# Patient Record
Sex: Female | Born: 1935 | Race: White | Hispanic: No | Smoking: Former smoker
Health system: Southern US, Community
[De-identification: ages and names within clinical notes are randomized; demographics above are authoritative.]

## PROBLEM LIST (undated history)

## (undated) DIAGNOSIS — N189 Chronic kidney disease, unspecified: Secondary | ICD-10-CM

## (undated) DIAGNOSIS — J449 Chronic obstructive pulmonary disease, unspecified: Secondary | ICD-10-CM

## (undated) DIAGNOSIS — I509 Heart failure, unspecified: Secondary | ICD-10-CM

## (undated) DIAGNOSIS — N289 Disorder of kidney and ureter, unspecified: Secondary | ICD-10-CM

## (undated) DIAGNOSIS — I639 Cerebral infarction, unspecified: Secondary | ICD-10-CM

## (undated) DIAGNOSIS — E7800 Pure hypercholesterolemia, unspecified: Secondary | ICD-10-CM

## (undated) DIAGNOSIS — F32A Depression, unspecified: Secondary | ICD-10-CM

## (undated) DIAGNOSIS — F329 Major depressive disorder, single episode, unspecified: Secondary | ICD-10-CM

## (undated) DIAGNOSIS — I10 Essential (primary) hypertension: Secondary | ICD-10-CM

## (undated) DIAGNOSIS — E119 Type 2 diabetes mellitus without complications: Secondary | ICD-10-CM

## (undated) HISTORY — PX: ABDOMINAL HYSTERECTOMY: SHX81

---

## 1997-05-15 ENCOUNTER — Other Ambulatory Visit: Admission: RE | Admit: 1997-05-15 | Discharge: 1997-05-15 | Payer: Self-pay | Admitting: *Deleted

## 1998-01-29 ENCOUNTER — Other Ambulatory Visit: Admission: RE | Admit: 1998-01-29 | Discharge: 1998-01-29 | Payer: Self-pay | Admitting: *Deleted

## 1999-03-22 ENCOUNTER — Encounter: Admission: RE | Admit: 1999-03-22 | Discharge: 1999-03-22 | Payer: Self-pay | Admitting: *Deleted

## 1999-07-22 ENCOUNTER — Other Ambulatory Visit: Admission: RE | Admit: 1999-07-22 | Discharge: 1999-07-22 | Payer: Self-pay | Admitting: *Deleted

## 2000-09-04 ENCOUNTER — Encounter: Payer: Self-pay | Admitting: Internal Medicine

## 2000-09-04 ENCOUNTER — Encounter: Admission: RE | Admit: 2000-09-04 | Discharge: 2000-09-04 | Payer: Self-pay | Admitting: Internal Medicine

## 2001-09-20 ENCOUNTER — Encounter: Admission: RE | Admit: 2001-09-20 | Discharge: 2001-09-20 | Payer: Self-pay | Admitting: Internal Medicine

## 2001-09-20 ENCOUNTER — Encounter: Payer: Self-pay | Admitting: Internal Medicine

## 2001-10-24 ENCOUNTER — Encounter: Payer: Self-pay | Admitting: *Deleted

## 2001-10-24 ENCOUNTER — Observation Stay (HOSPITAL_COMMUNITY): Admission: EM | Admit: 2001-10-24 | Discharge: 2001-10-25 | Payer: Self-pay | Admitting: *Deleted

## 2002-03-17 ENCOUNTER — Ambulatory Visit (HOSPITAL_COMMUNITY): Admission: RE | Admit: 2002-03-17 | Discharge: 2002-03-17 | Payer: Self-pay | Admitting: Internal Medicine

## 2002-03-17 ENCOUNTER — Encounter: Payer: Self-pay | Admitting: Internal Medicine

## 2002-08-03 ENCOUNTER — Encounter: Payer: Self-pay | Admitting: Neurology

## 2002-08-03 ENCOUNTER — Encounter: Admission: RE | Admit: 2002-08-03 | Discharge: 2002-08-03 | Payer: Self-pay | Admitting: Neurology

## 2002-09-09 ENCOUNTER — Ambulatory Visit (HOSPITAL_COMMUNITY): Admission: RE | Admit: 2002-09-09 | Discharge: 2002-09-09 | Payer: Self-pay | Admitting: Internal Medicine

## 2002-09-09 ENCOUNTER — Encounter: Payer: Self-pay | Admitting: Internal Medicine

## 2002-09-13 ENCOUNTER — Encounter: Admission: RE | Admit: 2002-09-13 | Discharge: 2002-12-12 | Payer: Self-pay | Admitting: Neurology

## 2002-09-23 ENCOUNTER — Encounter: Payer: Self-pay | Admitting: Internal Medicine

## 2002-09-23 ENCOUNTER — Encounter: Admission: RE | Admit: 2002-09-23 | Discharge: 2002-09-23 | Payer: Self-pay | Admitting: Internal Medicine

## 2003-02-09 ENCOUNTER — Encounter (HOSPITAL_COMMUNITY): Admission: RE | Admit: 2003-02-09 | Discharge: 2003-05-10 | Payer: Self-pay | Admitting: Internal Medicine

## 2003-03-02 ENCOUNTER — Ambulatory Visit (HOSPITAL_COMMUNITY): Admission: RE | Admit: 2003-03-02 | Discharge: 2003-03-02 | Payer: Self-pay | Admitting: Gastroenterology

## 2003-03-26 ENCOUNTER — Emergency Department (HOSPITAL_COMMUNITY): Admission: EM | Admit: 2003-03-26 | Discharge: 2003-03-26 | Payer: Self-pay | Admitting: Emergency Medicine

## 2003-04-17 ENCOUNTER — Encounter (HOSPITAL_BASED_OUTPATIENT_CLINIC_OR_DEPARTMENT_OTHER): Admission: RE | Admit: 2003-04-17 | Discharge: 2003-04-24 | Payer: Self-pay | Admitting: Internal Medicine

## 2003-07-15 ENCOUNTER — Ambulatory Visit (HOSPITAL_COMMUNITY): Admission: RE | Admit: 2003-07-15 | Discharge: 2003-07-15 | Payer: Self-pay | Admitting: Cardiology

## 2003-07-25 ENCOUNTER — Encounter (HOSPITAL_BASED_OUTPATIENT_CLINIC_OR_DEPARTMENT_OTHER): Admission: RE | Admit: 2003-07-25 | Discharge: 2003-08-03 | Payer: Self-pay | Admitting: Internal Medicine

## 2003-07-28 ENCOUNTER — Ambulatory Visit (HOSPITAL_COMMUNITY): Admission: RE | Admit: 2003-07-28 | Discharge: 2003-07-28 | Payer: Self-pay | Admitting: Gastroenterology

## 2003-07-28 ENCOUNTER — Encounter: Payer: Self-pay | Admitting: Gastroenterology

## 2003-11-01 ENCOUNTER — Encounter (HOSPITAL_BASED_OUTPATIENT_CLINIC_OR_DEPARTMENT_OTHER): Admission: RE | Admit: 2003-11-01 | Discharge: 2003-11-15 | Payer: Self-pay | Admitting: Internal Medicine

## 2004-02-14 ENCOUNTER — Encounter (HOSPITAL_BASED_OUTPATIENT_CLINIC_OR_DEPARTMENT_OTHER): Admission: RE | Admit: 2004-02-14 | Discharge: 2004-02-26 | Payer: Self-pay | Admitting: Internal Medicine

## 2004-05-06 ENCOUNTER — Ambulatory Visit (HOSPITAL_COMMUNITY): Admission: RE | Admit: 2004-05-06 | Discharge: 2004-05-06 | Payer: Self-pay | Admitting: Internal Medicine

## 2004-05-16 ENCOUNTER — Ambulatory Visit: Payer: Self-pay

## 2004-05-20 ENCOUNTER — Ambulatory Visit: Payer: Self-pay | Admitting: Cardiology

## 2004-05-21 ENCOUNTER — Ambulatory Visit: Payer: Self-pay | Admitting: Cardiology

## 2004-05-22 ENCOUNTER — Ambulatory Visit (HOSPITAL_COMMUNITY): Admission: RE | Admit: 2004-05-22 | Discharge: 2004-05-22 | Payer: Self-pay | Admitting: Cardiology

## 2004-06-03 ENCOUNTER — Ambulatory Visit: Payer: Self-pay

## 2004-06-07 ENCOUNTER — Ambulatory Visit: Payer: Self-pay | Admitting: Gastroenterology

## 2004-07-29 ENCOUNTER — Ambulatory Visit: Payer: Self-pay | Admitting: Cardiology

## 2004-09-30 ENCOUNTER — Ambulatory Visit: Payer: Self-pay | Admitting: Gastroenterology

## 2004-10-02 ENCOUNTER — Ambulatory Visit: Payer: Self-pay

## 2004-10-11 ENCOUNTER — Ambulatory Visit: Payer: Self-pay | Admitting: Gastroenterology

## 2004-10-11 ENCOUNTER — Ambulatory Visit (HOSPITAL_COMMUNITY): Admission: RE | Admit: 2004-10-11 | Discharge: 2004-10-11 | Payer: Self-pay | Admitting: Gastroenterology

## 2005-02-17 ENCOUNTER — Ambulatory Visit: Payer: Self-pay | Admitting: Cardiology

## 2005-03-05 ENCOUNTER — Ambulatory Visit: Payer: Self-pay

## 2005-04-08 ENCOUNTER — Ambulatory Visit: Payer: Self-pay | Admitting: *Deleted

## 2005-06-25 ENCOUNTER — Inpatient Hospital Stay (HOSPITAL_COMMUNITY): Admission: EM | Admit: 2005-06-25 | Discharge: 2005-06-28 | Payer: Self-pay | Admitting: Emergency Medicine

## 2005-06-25 ENCOUNTER — Ambulatory Visit: Payer: Self-pay | Admitting: Pulmonary Disease

## 2005-06-27 ENCOUNTER — Encounter: Payer: Self-pay | Admitting: Cardiology

## 2005-06-27 ENCOUNTER — Ambulatory Visit: Payer: Self-pay | Admitting: Cardiology

## 2005-06-27 ENCOUNTER — Encounter (INDEPENDENT_AMBULATORY_CARE_PROVIDER_SITE_OTHER): Payer: Self-pay | Admitting: *Deleted

## 2005-06-28 ENCOUNTER — Encounter (INDEPENDENT_AMBULATORY_CARE_PROVIDER_SITE_OTHER): Payer: Self-pay | Admitting: *Deleted

## 2005-07-15 ENCOUNTER — Ambulatory Visit: Payer: Self-pay | Admitting: Pulmonary Disease

## 2005-07-29 ENCOUNTER — Ambulatory Visit: Payer: Self-pay | Admitting: Cardiology

## 2005-09-23 ENCOUNTER — Ambulatory Visit (HOSPITAL_COMMUNITY): Admission: RE | Admit: 2005-09-23 | Discharge: 2005-09-23 | Payer: Self-pay | Admitting: Internal Medicine

## 2005-10-30 ENCOUNTER — Emergency Department (HOSPITAL_COMMUNITY): Admission: EM | Admit: 2005-10-30 | Discharge: 2005-10-30 | Payer: Self-pay | Admitting: Emergency Medicine

## 2006-06-25 ENCOUNTER — Ambulatory Visit: Payer: Self-pay | Admitting: Pulmonary Disease

## 2006-08-13 ENCOUNTER — Ambulatory Visit: Payer: Self-pay | Admitting: Internal Medicine

## 2006-09-15 ENCOUNTER — Encounter: Admission: RE | Admit: 2006-09-15 | Discharge: 2006-09-15 | Payer: Self-pay | Admitting: Internal Medicine

## 2006-12-28 ENCOUNTER — Ambulatory Visit: Payer: Self-pay | Admitting: Gastroenterology

## 2007-03-26 DIAGNOSIS — E278 Other specified disorders of adrenal gland: Secondary | ICD-10-CM | POA: Insufficient documentation

## 2007-03-26 DIAGNOSIS — K219 Gastro-esophageal reflux disease without esophagitis: Secondary | ICD-10-CM | POA: Insufficient documentation

## 2007-03-26 DIAGNOSIS — M810 Age-related osteoporosis without current pathological fracture: Secondary | ICD-10-CM | POA: Insufficient documentation

## 2007-03-26 DIAGNOSIS — Z862 Personal history of diseases of the blood and blood-forming organs and certain disorders involving the immune mechanism: Secondary | ICD-10-CM

## 2007-03-26 DIAGNOSIS — I10 Essential (primary) hypertension: Secondary | ICD-10-CM

## 2007-03-26 DIAGNOSIS — K2980 Duodenitis without bleeding: Secondary | ICD-10-CM | POA: Insufficient documentation

## 2007-03-26 DIAGNOSIS — K222 Esophageal obstruction: Secondary | ICD-10-CM

## 2007-03-26 DIAGNOSIS — Q2733 Arteriovenous malformation of digestive system vessel: Secondary | ICD-10-CM

## 2007-03-26 DIAGNOSIS — E785 Hyperlipidemia, unspecified: Secondary | ICD-10-CM

## 2007-03-26 DIAGNOSIS — K5520 Angiodysplasia of colon without hemorrhage: Secondary | ICD-10-CM | POA: Insufficient documentation

## 2007-07-09 ENCOUNTER — Encounter (HOSPITAL_COMMUNITY): Admission: RE | Admit: 2007-07-09 | Discharge: 2007-09-20 | Payer: Self-pay | Admitting: Internal Medicine

## 2007-11-04 ENCOUNTER — Ambulatory Visit (HOSPITAL_COMMUNITY): Admission: RE | Admit: 2007-11-04 | Discharge: 2007-11-04 | Payer: Self-pay | Admitting: Internal Medicine

## 2007-11-12 ENCOUNTER — Encounter: Payer: Self-pay | Admitting: Cardiology

## 2007-11-12 ENCOUNTER — Ambulatory Visit: Payer: Self-pay

## 2007-11-17 ENCOUNTER — Ambulatory Visit (HOSPITAL_COMMUNITY): Admission: RE | Admit: 2007-11-17 | Discharge: 2007-11-17 | Payer: Self-pay | Admitting: Internal Medicine

## 2008-08-24 ENCOUNTER — Inpatient Hospital Stay (HOSPITAL_COMMUNITY): Admission: EM | Admit: 2008-08-24 | Discharge: 2008-08-29 | Payer: Self-pay | Admitting: Emergency Medicine

## 2008-08-24 ENCOUNTER — Encounter (INDEPENDENT_AMBULATORY_CARE_PROVIDER_SITE_OTHER): Payer: Self-pay | Admitting: Internal Medicine

## 2008-08-24 ENCOUNTER — Ambulatory Visit: Payer: Self-pay | Admitting: Vascular Surgery

## 2008-08-25 ENCOUNTER — Ambulatory Visit: Payer: Self-pay | Admitting: Physical Medicine & Rehabilitation

## 2008-08-26 ENCOUNTER — Encounter (INDEPENDENT_AMBULATORY_CARE_PROVIDER_SITE_OTHER): Payer: Self-pay | Admitting: *Deleted

## 2008-08-29 ENCOUNTER — Encounter (INDEPENDENT_AMBULATORY_CARE_PROVIDER_SITE_OTHER): Payer: Self-pay | Admitting: *Deleted

## 2009-01-11 ENCOUNTER — Ambulatory Visit (HOSPITAL_COMMUNITY): Admission: RE | Admit: 2009-01-11 | Discharge: 2009-01-11 | Payer: Self-pay | Admitting: Endocrinology

## 2009-08-08 ENCOUNTER — Ambulatory Visit: Payer: Self-pay | Admitting: Internal Medicine

## 2009-08-08 ENCOUNTER — Inpatient Hospital Stay (HOSPITAL_COMMUNITY): Admission: EM | Admit: 2009-08-08 | Discharge: 2009-08-14 | Payer: Self-pay | Admitting: Emergency Medicine

## 2009-08-08 ENCOUNTER — Telehealth: Payer: Self-pay | Admitting: Gastroenterology

## 2009-08-08 DIAGNOSIS — I6350 Cerebral infarction due to unspecified occlusion or stenosis of unspecified cerebral artery: Secondary | ICD-10-CM | POA: Insufficient documentation

## 2009-08-08 DIAGNOSIS — J811 Chronic pulmonary edema: Secondary | ICD-10-CM | POA: Insufficient documentation

## 2009-08-08 DIAGNOSIS — J449 Chronic obstructive pulmonary disease, unspecified: Secondary | ICD-10-CM

## 2009-08-08 DIAGNOSIS — D649 Anemia, unspecified: Secondary | ICD-10-CM

## 2009-08-08 DIAGNOSIS — M1990 Unspecified osteoarthritis, unspecified site: Secondary | ICD-10-CM | POA: Insufficient documentation

## 2009-08-10 ENCOUNTER — Ambulatory Visit: Payer: Self-pay | Admitting: Internal Medicine

## 2009-08-10 ENCOUNTER — Encounter (INDEPENDENT_AMBULATORY_CARE_PROVIDER_SITE_OTHER): Payer: Self-pay | Admitting: Internal Medicine

## 2009-08-31 ENCOUNTER — Telehealth: Payer: Self-pay | Admitting: Internal Medicine

## 2009-09-04 ENCOUNTER — Encounter (INDEPENDENT_AMBULATORY_CARE_PROVIDER_SITE_OTHER): Payer: Self-pay | Admitting: *Deleted

## 2009-09-04 ENCOUNTER — Telehealth: Payer: Self-pay | Admitting: Gastroenterology

## 2009-10-16 ENCOUNTER — Ambulatory Visit: Payer: Self-pay | Admitting: Gastroenterology

## 2009-10-16 DIAGNOSIS — E119 Type 2 diabetes mellitus without complications: Secondary | ICD-10-CM | POA: Insufficient documentation

## 2009-10-16 DIAGNOSIS — K5289 Other specified noninfective gastroenteritis and colitis: Secondary | ICD-10-CM

## 2009-10-26 ENCOUNTER — Telehealth (INDEPENDENT_AMBULATORY_CARE_PROVIDER_SITE_OTHER): Payer: Self-pay | Admitting: *Deleted

## 2009-10-26 ENCOUNTER — Telehealth: Payer: Self-pay | Admitting: Gastroenterology

## 2009-11-23 ENCOUNTER — Ambulatory Visit: Payer: Self-pay | Admitting: Gastroenterology

## 2010-03-03 ENCOUNTER — Encounter: Payer: Self-pay | Admitting: Internal Medicine

## 2010-03-12 NOTE — Progress Notes (Signed)
Summary: Triage-Severe Diarrhea  Phone Note From Other Clinic   Caller: Tia @ [facility] [place] [phone] Call For: Dr. Arlyce Dice Summary of Call: Pt is having loose stool started a IV on 08-06-09 Initial call taken by: Karna Christmas,  August 08, 2009 8:59 AM  Follow-up for Phone Call        Pt. last seen 12-28-2006.  Pt. has been having loose, multiple stools daily for 4 weeks. Had a stool test that was negative for C-Diff, but she was given Vanco. 250mg  QID for 10 days and has been on IV fluids since Monday, per Dr.South.  No changes in pt. , Dr.South wants GI consulted.  Denies fever, blood, pain.   [facility] PLEASE ADVISE   Follow-up by: Laureen Ochs LPN,  August 08, 2009 9:23 AM  Additional Follow-up for Phone Call Additional follow up Details #1::        She needs an office visit Additional Follow-up by: Louis Meckel MD,  August 08, 2009 9:46 AM    Additional Follow-up for Phone Call Additional follow up Details #2::    Pt. will see Amy Esterwood PAC on 08-10-09 at 1:30pm.Pt's nurse, Janelle Floor, will send pt. med.list and redords and have a family member accompany the patient.  If symptoms become worse call back immediately or go to ER.  Follow-up by: Laureen Ochs LPN,  August 08, 2009 9:59 AM

## 2010-03-12 NOTE — Procedures (Signed)
Summary: Flexible Sigmoidoscopy  Patient: Michelle Bowers Note: All result statuses are Final unless otherwise noted.  Tests: (1) Flexible Sigmoidoscopy (FLX)  FLX Flexible Sigmoidoscopy                             DONE     [facility]     [address] [place], [place]  [address]           FLEXIBLE SIGMOIDOSCOPY PROCEDURE REPORT           PATIENT:  [name], [name] [place]  MR#:  [id number]     BIRTHDATE:  09-30-1930, 79 yrs. old  GENDER:  female           ENDOSCOPIST:  Hedwig Morton. Juanda Chance, MD     Referred by:           PROCEDURE DATE:  08/10/2009     PROCEDURE:  Flexible Sigmoidoscopy with biopsy     ASA CLASS:  Class III     INDICATIONS:  diarrhea C.Diff negative           MEDICATIONS:   Versed 3 mg, Fentanyl 37.5 mcg           DESCRIPTION OF PROCEDURE:   After the risks benefits and     alternatives of the procedure were thoroughly explained, informed     consent was obtained.  Digital rectal exam was performed and     revealed no rectal masses.   The [id number] ([id number]) endoscope was     introduced through the anus and advanced to the splenic flexure,     without limitations.  The quality of the prep was good.  The     instrument was then slowly withdrawn as the mucosa was fully     examined.     <<PROCEDUREIMAGES>>           The area of colon examined was normal in appearance. Random     biopsies were obtained and sent to pathology (see image001 and     image002). r/o microscopic colitis   Retroflexed views in the     rectum revealed no abnormalities.    The scope was then withdrawn     from the patient and the procedure terminated.           COMPLICATIONS:  None           ENDOSCOPIC IMPRESSION:     1) Normal colon     normal mucosa to 100cm ( splenic flrxure), no evidence of PMC,     s/p random biopsie to r/o microscopic colitis     RECOMMENDATIONS:     1) await biopsy results     cont Flagyl 250 mg po tid, Probiotic,low residue diet, see chart     for  orders           REPEAT EXAM:  In 0 year(s) for.           ______________________________     Hedwig Morton. Juanda Chance, MD           CC:           n.     eSIGNED:   Hedwig Morton. Zacherie Honeyman at 08/10/2009 11:45 AM           [place], [place], [phone]  Note: An exclamation mark (!) indicates a result that was not dispersed into the flowsheet. Document Creation Date: 08/10/2009 11:45 AM _______________________________________________________________________  (1)  Order result status: Final Collection or observation date-time: 08/10/2009 11:39 Requested date-time:  Receipt date-time:  Reported date-time:  Referring Physician:   Ordering Physician: Lina Sar [phone]) Specimen Source:  Source: Launa Grill Order Number: [id number] Lab site:

## 2010-03-12 NOTE — Assessment & Plan Note (Signed)
Summary: 23M FU/YF   History of Present Illness Visit Type: Follow-up Visit Primary GI MD: Melvia Heaps MD [facility] Primary Provider: Adrian Prince, MD  Requesting Provider: na Chief Complaint: One month f/u for diarrhea. Pt states that diarrhea is gettting better but at times she will have trouble swallowing but denies any other GI complaints  History of Present Illness:   Michelle Bowers has returned for followup of her diarrhea.  She has lymphocytic colitis.  She was recently started on lialda 2.4 g a day.  Diarrhea has entirely subsided as has her incontinence.  She complains of some difficulties with deglutition since her CVA.   GI Review of Systems    Reports dysphagia with liquids.      Denies abdominal pain, acid reflux, belching, bloating, chest pain, dysphagia with solids, heartburn, loss of appetite, nausea, vomiting, vomiting blood, weight loss, and  weight gain.        Denies anal fissure, black tarry stools, change in bowel habit, constipation, diarrhea, diverticulosis, fecal incontinence, heme positive stool, hemorrhoids, irritable bowel syndrome, jaundice, light color stool, liver problems, rectal bleeding, and  rectal pain.    Current Medications (verified): 1)  Norvasc 10 Mg Tabs (Amlodipine Besylate) .... One Tablet By Mouth Once Daily in The Morning 2)  Aspirin 81 Mg Tbec (Aspirin) .... One Tablet By Mouth Once Daily 3)  Cymbalta 30 Mg Cpep (Duloxetine Hcl) .... One Tablet By Mouth Once Daily in The Morning 4)  Lasix 40 Mg Tabs (Furosemide) .... One Tablet By Mouth Once Daily 5)  Amaryl 4 Mg Tabs (Glimepiride) .... One Tablet By Mouth Once Daily in The Morning 6)  Humalog 100 Unit/ml Soln (Insulin Lispro (Human)) .... Inject 3 Units With Breakfast 7)  Prevacid 30 Mg Cpdr (Lansoprazole) .... One Tablet By Mouth Once Daily 8)  Plavix 75 Mg Tabs (Clopidogrel Bisulfate) .... One Tablet By Mouth Once Daily 9)  Vitamin D3 1000 Unit Caps (Cholecalciferol) .... One Capsule By Mouth  Once Daily 10)  Hydralazine Hcl 100 Mg Tabs (Hydralazine Hcl) .... One Tablet By Mouth Three Times A Day 11)  Lantus 100 Unit/ml Soln (Insulin Glargine) .... Inject 5 Units Two Times A Day 12)  Tylenol 325 Mg Tabs (Acetaminophen) .... Two Tablets By Mouth Every Four Hours As Needed 13)  Ventolin Hfa 108 (90 Base) Mcg/act Aers (Albuterol Sulfate) .... As Directed 14)  Lialda 1.2 Gm  Tbec (Mesalamine) .... Take 2 By Mouth Once Daily. 15)  Florastor 250 Mg Caps (Saccharomyces Boulardii) .... One By Mouth Once Daily  Allergies (verified): 1)  ! Codeine 2)  ! Lopid 3)  ! * Avandia 4)  ! Cardura 5)  ! Prednisone  Past History:  Past Medical History: Reviewed history from 10/16/2009 and no changes required. DM (ICD-250.00) * SUPERIOR MESENTERIC ARTERY ATHEROSCLEROSIS COPD (ICD-496) PULMONARY EDEMA (ICD-514) ANEMIA (ICD-285.9) DEGENERATIVE JOINT DISEASE (ICD-715.90) CEREBROVASCULAR ACCIDENT (ICD-434.91) OSTEOPOROSIS (ICD-733.00) GERD (ICD-530.81) Hx of HYPERLIPIDEMIA (ICD-272.4) Hx of HYPERTENSION (ICD-401.9) Hx of ANGIODYSPLASIA OF INTESTINE (ICD-569.84) Hx of DUODENITIS, WITHOUT HEMORRHAGE (ICD-535.60) Hx of ESOPHAGEAL STRICTURE (ICD-530.3) ADRENAL MASS, LEFT (ICD-255.8) ARTERIOVENOUS MALFORMATION, CECUM (ICD-747.61)  Past Surgical History: Reviewed history from 08/08/2009 and no changes required. vaginal hysterectomy silicone breast implants nose surgery  Family History: Reviewed history from 10/16/2009 and no changes required. Family History of Stomach Cancer: Father Family History of Heart Disease: Mother No FH of Colon Cancer:  Social History: Reviewed history from 10/16/2009 and no changes required. Retired Divorced-Widowed Alcohol Use - no-rare Illicit Drug Use -  no Patient is a former smoker.   Review of Systems       The patient complains of arthritis/joint pain.  The patient denies allergy/sinus, anemia, anxiety-new, back pain, blood in urine, breast  changes/lumps, change in vision, confusion, cough, coughing up blood, depression-new, fainting, fatigue, fever, headaches-new, hearing problems, heart murmur, heart rhythm changes, itching, menstrual pain, muscle pains/cramps, night sweats, nosebleeds, pregnancy symptoms, shortness of breath, skin rash, sleeping problems, sore throat, swelling of feet/legs, swollen lymph glands, thirst - excessive , urination - excessive , urination changes/pain, urine leakage, vision changes, and voice change.    Vital Signs:  Patient profile:   77 year old female Pulse rate:   88 / minute Pulse rhythm:   regular BP sitting:   116 / 60  (left arm) Cuff size:   regular  Vitals Entered By: Ok Anis CMA (November 23, 2009 10:22 AM)   Impression & Recommendations:  Problem # 1:  COLITIS (ICD-558.9) The patient has lymphocytic colitis that has responded well to lialda.  Plan to continue with the same.  Patient Instructions: 1)  Call back as needed  2)  The medication list was reviewed and reconciled.  All changed / newly prescribed medications were explained.  A complete medication list was provided to the patient / caregiver.

## 2010-03-12 NOTE — Assessment & Plan Note (Signed)
Summary: POST HOSPITAL F/U           [facility]   History of Present Illness Visit Type: Follow-up Visit Primary GI MD: Melvia Heaps MD [facility] Primary Provider: Adrian Prince, MD  Requesting Provider: na Chief Complaint: [facility] f/u for recurrent diarrhea. Pt states that she is still having diarrhea  History of Present Illness:   Ms. Heringer has returned for followup of her diarrhea.  She was diagnosed with lymphocytic colitis in July, 2011 at which time she underwent sigmoidoscopy.  Entocort was recommended though it is unclear whether she was placed on this medication.  At any rate, she continues to have diarrhea with incontinence.  Incontinence is due to severe urgency.   GI Review of Systems      Denies abdominal pain, acid reflux, belching, bloating, chest pain, dysphagia with liquids, dysphagia with solids, heartburn, loss of appetite, nausea, vomiting, vomiting blood, weight loss, and  weight gain.      Reports diarrhea.     Denies anal fissure, black tarry stools, change in bowel habit, constipation, diverticulosis, fecal incontinence, heme positive stool, hemorrhoids, irritable bowel syndrome, jaundice, light color stool, liver problems, rectal bleeding, and  rectal pain.    Current Medications (verified): 1)  Norvasc 10 Mg Tabs (Amlodipine Besylate) .... One Tablet By Mouth Once Daily in The Morning 2)  Aspirin 81 Mg Tbec (Aspirin) .... One Tablet By Mouth Once Daily 3)  Cymbalta 30 Mg Cpep (Duloxetine Hcl) .... One Tablet By Mouth Once Daily in The Morning 4)  Lasix 40 Mg Tabs (Furosemide) .... One Tablet By Mouth Once Daily 5)  Amaryl 4 Mg Tabs (Glimepiride) .... One Tablet By Mouth Once Daily in The Morning 6)  Humalog 100 Unit/ml Soln (Insulin Lispro (Human)) .... Inject 3 Units With Breakfast 7)  Prevacid 30 Mg Cpdr (Lansoprazole) .... One Tablet By Mouth Once Daily 8)  Plavix 75 Mg Tabs (Clopidogrel Bisulfate) .... One Tablet By Mouth Once Daily 9)  Vitamin D3 1000 Unit Caps  (Cholecalciferol) .... One Capsule By Mouth Once Daily 10)  Hydralazine Hcl 100 Mg Tabs (Hydralazine Hcl) .... One Tablet By Mouth Three Times A Day 11)  Imodium A-D 2 Mg Tabs (Loperamide Hcl) .... One Tablet By Mouth Before Meals and One Tablet By Mouth At Bedtime 12)  Lantus 100 Unit/ml Soln (Insulin Glargine) .... Inject 5 Units Two Times A Day 13)  Tylenol 325 Mg Tabs (Acetaminophen) .... Two Tablets By Mouth Every Four Hours As Needed 14)  Ventolin Hfa 108 (90 Base) Mcg/act Aers (Albuterol Sulfate) .... As Directed  Allergies (verified): 1)  ! Codeine 2)  ! Lopid 3)  ! * Avandia 4)  ! Cardura 5)  ! Prednisone  Past History:  Past Medical History: DM (ICD-250.00) * SUPERIOR MESENTERIC ARTERY ATHEROSCLEROSIS COPD (ICD-496) PULMONARY EDEMA (ICD-514) ANEMIA (ICD-285.9) DEGENERATIVE JOINT DISEASE (ICD-715.90) CEREBROVASCULAR ACCIDENT (ICD-434.91) OSTEOPOROSIS (ICD-733.00) GERD (ICD-530.81) Hx of HYPERLIPIDEMIA (ICD-272.4) Hx of HYPERTENSION (ICD-401.9) Hx of ANGIODYSPLASIA OF INTESTINE (ICD-569.84) Hx of DUODENITIS, WITHOUT HEMORRHAGE (ICD-535.60) Hx of ESOPHAGEAL STRICTURE (ICD-530.3) ADRENAL MASS, LEFT (ICD-255.8) ARTERIOVENOUS MALFORMATION, CECUM (ICD-747.61)  Past Surgical History: Reviewed history from 08/08/2009 and no changes required. vaginal hysterectomy silicone breast implants nose surgery  Family History: Family History of Stomach Cancer: Father Family History of Heart Disease: Mother No FH of Colon Cancer:  Social History: Retired Divorced-Widowed Alcohol Use - no-rare Illicit Drug Use - no Patient is a former smoker.  Smoking Status:  quit  Review of Systems  The patient complains of arthritis/joint pain, fatigue, and hearing problems.  The patient denies allergy/sinus, anemia, anxiety-new, back pain, blood in urine, breast changes/lumps, change in vision, confusion, cough, coughing up blood, depression-new, fainting, fever, headaches-new,  heart murmur, heart rhythm changes, itching, menstrual pain, muscle pains/cramps, night sweats, nosebleeds, pregnancy symptoms, shortness of breath, skin rash, sleeping problems, sore throat, swelling of feet/legs, swollen lymph glands, thirst - excessive , urination - excessive , urination changes/pain, urine leakage, vision changes, and voice change.    Vital Signs:  Patient profile:   77 year old female Pulse rate:   88 / minute Pulse rhythm:   regular BP sitting:   112 / 58  (left arm) Cuff size:   regular  Vitals Entered By: Ok Anis CMA (October 16, 2009 2:50 PM)   Impression & Recommendations:  Problem # 1:  COLITIS (ICD-558.9) Diarrhea is most likely due to her lymphocytic colitis although her polypharmacy may be contributing.  Recommendations #1 trial of Pepto-Bismol 15cc  q.i.d. She can continue Imodium p.r.n.  Problem # 2:  Hx of ANGIODYSPLASIA OF INTESTINE (NFA-213.08) Assessment: Comment Only  Patient Instructions: 1)  Please schedule a follow-up appointment in 1 month.  2)  #1 trial of Pepto-Bismol 15cc  q.i.d. She can continue Imodium p.r.n. 3)  The medication list was reviewed and reconciled.  All changed / newly prescribed medications were explained.  A complete medication list was provided to the patient / caregiver. Prescriptions: PEPTO-BISMOL MAX STRENGTH 525 MG/15ML SUSP (BISMUTH SUBSALICYLATE) take 1 tablespoon  four times a day  #1 bottle x 2   Entered by:   Merri Ray CMA (AAMA)   Authorized by:   Louis Meckel MD   Signed by:   Merri Ray CMA (AAMA) on 10/16/2009   Method used:   Print then Give to Patient   RxID:   [id number]

## 2010-03-12 NOTE — Progress Notes (Signed)
Summary: Med. Change  Phone Note Other Incoming   Caller: [phone] Summary of Call: [facility] called and administrator is asking if something besides Entocort 9mg  daily can be ordered for pt. as a 5 day supply is $300.00. and pt is on Medicare.Forwarded to Dr.of the day in Dr.Kaplan's absence. Initial call taken by: Teryl Lucy RN,  October 26, 2009 4:51 PM  Follow-up for Phone Call        I do not feel comfortable prescribing alternative therapies for this patient, that I am not familiar with. The problem is chronic. They can wait for Dr. Arlyce Dice return on Monday Follow-up by: Hilarie Fredrickson MD,  October 26, 2009 4:53 PM  Additional Follow-up for Phone Call Additional follow up Details #1::        Naomi at [facility]. of Dr.Perry's response. Will forward to Dr.Kaplan to address when he returns Monday. Additional Follow-up by: Teryl Lucy RN,  October 26, 2009 4:58 PM    Additional Follow-up for Phone Call Additional follow up Details #2::    DR.KAPLAN--Pt. cannot afford Entocort, Please advise on another med. Laureen Ochs LPN  October 29, 2009 9:34 AM   Additional Follow-up for Phone Call Additional follow up Details #3:: Details for Additional Follow-up Action Taken: try lialda 2.4gm once daily c/b 1 week Additional Follow-up by: Louis Meckel MD,  October 29, 2009 9:47 AM  New/Updated Medications: LIALDA 1.2 GM  TBEC (MESALAMINE) Take 2 by mouth once daily. Prescriptions: LIALDA 1.2 GM  TBEC (MESALAMINE) Take 2 by mouth once daily.  #60 x 6   Entered by:   Laureen Ochs LPN   Authorized by:   Louis Meckel MD   Signed by:   Laureen Ochs LPN on 33/29/5188   Method used:   Printed then faxed to ...         RxID:   [id number]  Janelle Floor advised of med change. Script faxed to her. She will call with pt. update in 1 week, sooner as needed. Laureen Ochs LPN  October 29, 2009 10:00 AM

## 2010-03-12 NOTE — Progress Notes (Signed)
Summary: TRIAGE-DIARRHEA  Phone Note Call from Patient   Caller: Daughter  Aurther Loft [phone] Call For: Dr. Juanda Chance Summary of Call: Diarrhea...pt is wanting to go to ER but pt.'s daughter would like to know what Dr. Juanda Chance recommends Initial call taken by: Karna Christmas,  August 31, 2009 10:49 AM  Follow-up for Phone Call        Pt. was discharged on 08-14-09. Per hospital reports, pt. was negative for c-diff.  Flex biopsies done 08-10-09 showed lymphocytic colitis. Among her other discharge meds are Florastor once daily and Immodium 2 three times a day AC & HS.  Message left for patient's daughter  to callback. Follow-up by: Laureen Ochs LPN,  August 31, 2009 11:01 AM  Additional Follow-up for Phone Call Additional follow up Details #1::        Per pt. daughter, pt. is having episodes of diarrhea, they are becomming more frequent. Denies blood, fever, pain.   DR.Chamille Werntz PLEASE ADVISE  Additional Follow-up by: Laureen Ochs LPN,  August 31, 2009 12:47 PM    Additional Follow-up for Phone Call Additional follow up Details #2::    Please start pt. on Prednisone 10 mg daily, # 30, 1 refill. I have left a message for daughter Aurther Loft to call you, Gavin Pound, this afternoon concerning sending pt Prednisone. I would like her to start it before the weekend. Follow-up by: Hart Carwin MD,  August 31, 2009 1:05 PM  Additional Follow-up for Phone Call Additional follow up Details #3:: Details for Additional Follow-up Action Taken: Above MD orders reviewed with patients daughter,Terry and I have given the Prednisone order to the patients nurse, Candy Sledge LPN, at [facility].  Terry/Naomi instructed to call back as needed. I will call for an update on Monday. Additional Follow-up by: Laureen Ochs LPN,  August 31, 2009 1:19 PM  New/Updated Medications: PREDNISONE 10 MG  TABS (PREDNISONE) Take 1 by mouth daily. Prescriptions: PREDNISONE 10 MG  TABS (PREDNISONE) Take 1 by mouth daily.  #30 x 1   Entered by:   Laureen Ochs LPN   Authorized by:   Hart Carwin MD   Signed by:   Laureen Ochs LPN on 45/40/9811   Method used:   Historical   RxID:   [id number]

## 2010-03-12 NOTE — Progress Notes (Signed)
Summary: Condition Update  Phone Note Outgoing Call   Call placed by: Laureen Ochs LPN,  September 04, 2009 8:29 AM Call placed to: pt. daughter,Terry [phone] Summary of Call: CONDITION UPDATE-Per pt. daughter,Terry, since starting Prednisone 10mg  once daily the pt's diarrhea has improved. She had Lymphocytic Colitis on Flex. biopsy done in the hospital on 08-10-09.   DR.Jaselynn Tamas--Pt. is paralyzed and lives in a nursing home, daughter states it is dificult to get her to the office for an appointment, but they can make arrangements.  Do you want to see her in the office or just give further orders by phone?   Initial call taken by: Laureen Ochs LPN,  September 04, 2009 8:32 AM  Follow-up for Phone Call        If she's improved she can alternate pred 10mg  with 5mg  once daily for 1 week, then decrease to 5mg  once daily for a week, then 5mg  every other day for 1 week. OV 1 month Follow-up by: Louis Meckel MD,  September 04, 2009 9:46 AM  Additional Follow-up for Phone Call Additional follow up Details #1::        1) Above MD orders given to Dr.South's nurse,Jamie LPN, to give to the [facility].   2) F/U appt. w/Dr.Jurline Folger is scheduled for 10-16-09 at 2:30pm.  Appt. info. given to pt's daughter, Aurther Loft.  Aurther Loft will callback as needed. Additional Follow-up by: Laureen Ochs LPN,  September 04, 2009 10:44 AM

## 2010-03-12 NOTE — Progress Notes (Signed)
  Phone Note Other Incoming   Summary of Call: Call rec'd from [place] at [facility] and over the past week pt. has been having lg. explosive episodes of diarrhea with urgency and cramping which occur 1-2 x/day..No blood or mucus seen in stool No fever..Is using Pepto-Bismol q.i.d. and Immodium usually once a day.Dr.South was contacted and he referred her back to you for further tx. Initial call taken by: Teryl Lucy RN,  October 26, 2009 11:09 AM  Follow-up for Phone Call        begin entocort 9mg  once daily c/b next weeks Follow-up by: Louis Meckel MD,  October 26, 2009 1:36 PM  Additional Follow-up for Phone Call Additional follow up Details #1::        Janelle Floor at [facility] given Dr.Kaplan's recommendations. Additional Follow-up by: Teryl Lucy RN,  October 26, 2009 3:00 PM

## 2010-03-12 NOTE — Discharge Summary (Signed)
Summary: Anemia, Acute Pulmonary Edema   NAME:  Michelle Bowers, Michelle Bowers             ACCOUNT NO.:  [id number]   MEDICAL RECORD NO.:  [id number]          PATIENT TYPE:  INP   LOCATION:  4706                         FACILITY:  [facility]   PHYSICIAN:  Hillery Aldo, M.D.   DATE OF BIRTH:  Jun 29, 1930   DATE OF ADMISSION:  06/25/2005  DATE OF DISCHARGE:  06/28/2005                                 DISCHARGE SUMMARY   DISCHARGE DIAGNOSES:  1.  Acute pulmonary edema thought to be secondary to a combination of      treatment with Actos and volume overload from blood transfusions  2.  Subcarinal mass status post biopsy, pathology pending.  3.  Diabetes mellitus.  4.  Hypokalemia.  5.  Hypertension.  6.  Anemia with low iron stores and heme-positive stool status post iron,      B12 and folate studies.  7.  Ongoing tobacco abuse.  8.  Degenerative joint disease.   DISCHARGE MEDICATIONS:  1.  Aspirin 81 mg daily.  2.  Metformin 850 mg t.i.d.  3.  Norvasc 10 mg daily.  4.  Diovan/HCTZ 1-1/2 tablets daily.  5.  Metoprolol 25 mg b.i.d.  6.  Chantix as before.  7.  Iron sulfate 325 mg b.i.d.  8.  Spiriva 1 inhalation daily.  9.  Albuterol meter dose inhaler 2 puffs q.4h. p.r.n.  10. Lasix 40 mg daily.  11. Potassium chloride 20 mEq daily.   Note, the patient was instructed to discontinue Actos and to start her  Januvia which she had not yet done.   CONSULTATIONS:  Dr. Danice Goltz of pulmonary medicine.   BRIEF ADMISSION HPI:  The patient is a 77 year old female with past medical  history of chronic anemia who presented to the hospital with complaints of  chest pain, dyspnea and paroxysmal nocturnal dyspnea.  Upon initial  evaluation, she was found to be anemic and had an elevated BNP.  She  subsequently underwent a CT angiogram of the chest which revealed a  subcarinal mass.  She was admitted for further evaluation and workup.   PROCEDURES AND DIAGNOSTIC STUDIES:  1.  Chest x-ray on 5, 16,  2007 showed linear atelectasis or scarring at the      left base.  There was stable mild cardiomegaly noted.  2.  CT angiogram of the chest on Jun 25, 2005 showed no evidence of      pulmonary embolus.  There were multiple borderline enlarged paratracheal      nodes and an enlarged subcarinal nodal mass which was concerning for      metastatic disease.  There was a small right pleural effusion and      bilateral lower lobe atelectasis.  There was intralobar septal      thickening suggestive of edema.  Focal ground glass attenuation anterior      right upper lobe was thought to reflect atelectasis.  There was mild      cardiomegaly and mitral valve annular calcifications.  There were      bilateral adrenal masses for incompletely characterized and the  radiologist recommended a CT scan with and without contrast versus an      MRI for further evaluation if clinically indicated.  3.  A 2-D echocardiography on Jun 27, 2005 showed overall left ventricular      systolic function was vigorous.  There were no left regional wall motion      abnormalities of the ventricle.  There was mild fibrocalcific change of      the aortic root.  There was moderate mitral annular calcification.      There was mild mitral valvular regurgitation.  Left atrial size was at      the upper limits of normal.  There was moderate right ventricular      hypertrophy.  The inferior vena caval was mildly dilated.  4.  Bronchoscopy with biopsy of subcarinal mass done on Jun 27, 2005:      Pathology is pending at the time of this dictation.   DISCHARGE LABORATORY VALUES:  CBC showed a white blood cell count of 8.3,  hemoglobin 10.5, hematocrit 32, platelet count 351.  Sodium was 138,  potassium 3.8, chloride 106, bicarb 25, BUN 19, creatinine 0.9, glucose 217.  Hemoglobin A1c was 6.2, TSH 2.048, iron 27, TIBC 334, percent saturation 8,  B12 741, RBC folate 1226, and ferritin 10.  Fecal occult blood testing was   positive.   HOSPITAL COURSE:  Problem 1:  ACUTE PULMONARY EDEMA:  The patient's clinical  appearance and elevated BNP was suggestive of pulmonary edema.  She also had  a mild elevation of her troponins with normal CK and CK-MB values.  Given  this, her dyspnea was thought to be multifactorial and secondary to anemia,  COPD and mild congestive heart failure thought to be secondary to treatment  with Actos and then complicated by volume overload secondary to 2 units of  packed red blood cells.  She was put on 40 mg of Lasix and a 2-D  echocardiogram was done which did not show any evidence of systolic or  diastolic heart failure.  Her Actos was discontinued and she continued to  diurese with Lasix.  To address her underlying COPD, she was put on Spiriva  and albuterol.  Recommendations were made for outpatient pulmonary function  tests and she is scheduled to follow up with Dr. Jayme Cloud of pulmonary  medicine in one to two weeks' time.  At this point, she is at her baseline  and again has been encouraged to discontinue tobacco smoking.   Problem 2:  SUBCARINAL MASS:  The patient was noted to have a mass in the  subcarinal area on CT scanning of the chest.  Given this, pulmonary  consultation was obtained for bronchoscopy.  The patient underwent  bronchoscopy on Jun 27, 2005.  Biopsies were taken and pathology is pending  at this time.  Incidentally noted was some element of chronic bronchitis on  the bronchoscopy.   Problem 3:  DIABETES:  The patient's metformin was held secondary to  anticipation of contrast dye been given for the CT angiogram of the chest.  Additionally, because of her pulmonary edema, her Actos was discontinued.  While here, she was maintained on a combination of Lantus and sliding scale  insulin.  We will discharge her on metformin and have her continue to hold  her Actos.  The patient states that her primary care physician did start her  on Januvia but she had not  started medication.  She was instructed to start this upon return  home and to keep a log of her blood glucoses so she can  view them with her primary care physician and adjustments can be made to her  hypoglycemic regimen if indicated.  Her hemoglobin A1c was 6.2 revealing  that she has had good control over the past three months.   Problem 4:  HYPERTENSION:  The patient's hypertension was well controlled on  her usual home medicines.   Problem 5:  HYPOKALEMIA:  The patient became slightly hypokalemic with the  addition of Lasix.  She was appropriately repleted and will be discharged on  supplementation.   Problem 6:  ANEMIA:  The patient's admission hemoglobin was 8.4.  She had  recently been seen by her primary care physician and at that time was found  to have heme negative stool.  Her stool was found to be heme positive here.  The patient reports that she has had a GI workup within the last year that  has included an upper and lower endoscopy.  She reports a problem with  anemia chronically.     Given her symptoms, the patient was transfused 2 units packed red blood  cells with an appropriate rise in her hemoglobin.  Iron studies, B12 and  folate studies were checked.  Findings are as noted above.  She was put on  iron supplementation for her low iron stores but she also has a component of  anemia of chronic disease.  Given her recent outpatient GI workup, no  further GI workup was undertaken.  She should follow up with Dr. Arlyce Dice for  any ongoing GI evaluation as an outpatient.   Problem 7:  TOBACCO ABUSE:  The patient was counseled on the importance of  cessation.  Additionally, she states that she has been on Chantix and was  encouraged to continue this at discharge.   DISPOSITION:  The patient will be discharged home.  She is to follow up with  Dr. Jayme Cloud in 1-2 weeks, Dr. Elisabeth Most 1-2 weeks, and Dr. Arlyce Dice on an as-  needed basis.            ______________________________  Hillery Aldo, M.D.     CR/MEDQ  D:  06/28/2005  T:  06/28/2005  Job:  [id number]   cc:   Barbette Hair. Arlyce Dice, M.D. [facility]  [address]  [place]  [place] [address]   Danice Goltz, M.D. [facility]  [address] [place], [place] [address]   Lovenia Kim, D.O.  Fax: [phone]

## 2010-03-12 NOTE — Letter (Signed)
Summary: Appt Reminder 2  [facility]  [address] [place], [place] [address]   Phone: [phone]  Fax: [phone]        September 04, 2009 MRN: [id number]    [facility] [address] [place], [place]  [address]    Dear Ms. Edwyna Ready,   You have a return appointment with Dr.Robert Arlyce Dice on 10-16-09 at 2:30pm.  Please remember to bring a complete list of the medicines you are taking, your insurance card and your co-pay.  If you have to cancel or reschedule this appointment, please call before 5:00 pm the evening before to avoid a cancellation fee.  If you have any questions or concerns, please call [phone].    Sincerely,    Laureen Ochs LPN  Appended Document: Appt Reminder 2 Letter mailed to patient.

## 2010-04-26 ENCOUNTER — Other Ambulatory Visit: Payer: Self-pay | Admitting: Internal Medicine

## 2010-04-28 LAB — BASIC METABOLIC PANEL
BUN: 5 mg/dL — ABNORMAL LOW (ref 6–23)
BUN: 7 mg/dL (ref 6–23)
CO2: 28 mEq/L (ref 19–32)
CO2: 29 mEq/L (ref 19–32)
CO2: 30 mEq/L (ref 19–32)
Calcium: 7.4 mg/dL — ABNORMAL LOW (ref 8.4–10.5)
Calcium: 7.4 mg/dL — ABNORMAL LOW (ref 8.4–10.5)
Calcium: 8.5 mg/dL (ref 8.4–10.5)
Calcium: 8.7 mg/dL (ref 8.4–10.5)
Chloride: 104 mEq/L (ref 96–112)
Chloride: 109 mEq/L (ref 96–112)
Chloride: 110 mEq/L (ref 96–112)
Creatinine, Ser: 0.83 mg/dL (ref 0.4–1.2)
Creatinine, Ser: 0.84 mg/dL (ref 0.4–1.2)
Creatinine, Ser: 0.88 mg/dL (ref 0.4–1.2)
GFR calc Af Amer: >60 mL/min (ref >60)
GFR calc Af Amer: >60 mL/min (ref >60)
GFR calc Af Amer: >60 mL/min (ref >60)
GFR calc non Af Amer: >60 mL/min (ref >60)
GFR calc non Af Amer: >60 mL/min (ref >60)
Glucose, Bld: 134 mg/dL — ABNORMAL HIGH (ref 70–99)
Glucose, Bld: 177 mg/dL — ABNORMAL HIGH (ref 70–99)
Glucose, Bld: 197 mg/dL — ABNORMAL HIGH (ref 70–99)
Glucose, Bld: 228 mg/dL — ABNORMAL HIGH (ref 70–99)
Glucose, Bld: 228 mg/dL — ABNORMAL HIGH (ref 70–99)
Potassium: 3.3 mEq/L — ABNORMAL LOW (ref 3.5–5.1)
Potassium: 4.9 mEq/L (ref 3.5–5.1)
Sodium: 141 mEq/L (ref 135–145)
Sodium: 145 mEq/L (ref 135–145)

## 2010-04-28 LAB — COMPREHENSIVE METABOLIC PANEL
ALT: 10 U/L (ref 0–35)
AST: 11 U/L (ref 0–37)
Alkaline Phosphatase: 80 U/L (ref 39–117)
CO2: 24 mEq/L (ref 19–32)
Chloride: 114 mEq/L — ABNORMAL HIGH (ref 96–112)
Creatinine, Ser: 1.01 mg/dL (ref 0.4–1.2)
GFR calc Af Amer: >60 mL/min (ref >60)
GFR calc non Af Amer: 53 mL/min — ABNORMAL LOW (ref >60)
Sodium: 146 mEq/L — ABNORMAL HIGH (ref 135–145)
Total Bilirubin: 0.4 mg/dL (ref 0.3–1.2)

## 2010-04-28 LAB — GLUCOSE, CAPILLARY
Glucose-Capillary: 103 mg/dL — ABNORMAL HIGH (ref 70–99)
Glucose-Capillary: 126 mg/dL — ABNORMAL HIGH (ref 70–99)
Glucose-Capillary: 155 mg/dL — ABNORMAL HIGH (ref 70–99)
Glucose-Capillary: 160 mg/dL — ABNORMAL HIGH (ref 70–99)
Glucose-Capillary: 172 mg/dL — ABNORMAL HIGH (ref 70–99)
Glucose-Capillary: 180 mg/dL — ABNORMAL HIGH (ref 70–99)
Glucose-Capillary: 191 mg/dL — ABNORMAL HIGH (ref 70–99)
Glucose-Capillary: 233 mg/dL — ABNORMAL HIGH (ref 70–99)
Glucose-Capillary: 234 mg/dL — ABNORMAL HIGH (ref 70–99)
Glucose-Capillary: 260 mg/dL — ABNORMAL HIGH (ref 70–99)
Glucose-Capillary: 290 mg/dL — ABNORMAL HIGH (ref 70–99)
Glucose-Capillary: 357 mg/dL — ABNORMAL HIGH (ref 70–99)
Glucose-Capillary: 398 mg/dL — ABNORMAL HIGH (ref 70–99)
Glucose-Capillary: 162 mg/dL — ABNORMAL HIGH (ref 70–99)
Glucose-Capillary: 218 mg/dL — ABNORMAL HIGH (ref 70–99)
Glucose-Capillary: 285 mg/dL — ABNORMAL HIGH (ref 70–99)

## 2010-04-28 LAB — RENAL FUNCTION PANEL
CO2: 21 mEq/L (ref 19–32)
Glucose, Bld: 198 mg/dL — ABNORMAL HIGH (ref 70–99)
Phosphorus: 2.4 mg/dL (ref 2.3–4.6)
Potassium: 2 mEq/L — CL (ref 3.5–5.1)
Sodium: 144 mEq/L (ref 135–145)

## 2010-04-28 LAB — CBC
HCT: 31 % — ABNORMAL LOW (ref 36.0–46.0)
Hemoglobin: 10.5 g/dL — ABNORMAL LOW (ref 12.0–15.0)
Hemoglobin: 9.2 g/dL — ABNORMAL LOW (ref 12.0–15.0)
MCH: 31.5 pg (ref 26.0–34.0)
MCH: 31.7 pg (ref 26.0–34.0)
MCHC: 34.2 g/dL (ref 30.0–36.0)
MCV: 92.8 fL (ref 78.0–100.0)
MCV: 93.2 fL (ref 78.0–100.0)
MCV: 91.4 fL (ref 78.0–100.0)
Platelets: 354 10*3/uL (ref 150–400)
RBC: 2.91 MIL/uL — ABNORMAL LOW (ref 3.87–5.11)
RBC: 3.33 MIL/uL — ABNORMAL LOW (ref 3.87–5.11)
RBC: 3.08 MIL/uL — ABNORMAL LOW (ref 3.87–5.11)
WBC: 10 10*3/uL (ref 4.0–10.5)

## 2010-04-28 LAB — MAGNESIUM
Magnesium: 2 mg/dL (ref 1.5–2.5)
Magnesium: 1.6 mg/dL (ref 1.5–2.5)

## 2010-04-28 LAB — DIFFERENTIAL
Eosinophils Relative: 1 % (ref 0–5)
Lymphocytes Relative: 6 % — ABNORMAL LOW (ref 12–46)
Lymphs Abs: 0.6 10*3/uL — ABNORMAL LOW (ref 0.7–4.0)

## 2010-04-28 LAB — SEDIMENTATION RATE: Sed Rate: 36 mm/hr — ABNORMAL HIGH (ref 0–22)

## 2010-04-28 LAB — CLOSTRIDIUM DIFFICILE EIA: C difficile Toxins A+B, EIA: NEGATIVE

## 2010-04-28 LAB — MRSA PCR SCREENING: MRSA by PCR: NEGATIVE

## 2010-04-29 ENCOUNTER — Other Ambulatory Visit: Payer: Self-pay | Admitting: Internal Medicine

## 2010-04-29 ENCOUNTER — Ambulatory Visit
Admission: RE | Admit: 2010-04-29 | Discharge: 2010-04-29 | Disposition: A | Discharge status: Home / Self Care | Payer: Medicare Other | Source: Ambulatory Visit | Attending: Internal Medicine | Admitting: Internal Medicine

## 2010-04-29 MED ORDER — IOHEXOL 300 MG/ML  SOLN
100.0000 mL | Freq: Once | INTRAMUSCULAR | Status: AC | PRN
  Administered 2010-04-29: 100 mL via INTRAVENOUS

## 2010-05-14 ENCOUNTER — Other Ambulatory Visit (HOSPITAL_COMMUNITY): Payer: Self-pay | Admitting: General Surgery

## 2010-05-14 DIAGNOSIS — K8020 Calculus of gallbladder without cholecystitis without obstruction: Secondary | ICD-10-CM

## 2010-05-14 LAB — GLUCOSE, CAPILLARY: Glucose-Capillary: 165 mg/dL — ABNORMAL HIGH (ref 70–99)

## 2010-05-14 LAB — CROSSMATCH: ABO/RH(D): O NEG

## 2010-05-19 LAB — PROTIME-INR: INR: 0.9 (ref 0.00–1.49)

## 2010-05-19 LAB — LIPID PANEL
HDL: 67 mg/dL (ref >39)
LDL Cholesterol: 105 mg/dL — ABNORMAL HIGH (ref 0–99)
Triglycerides: 104 mg/dL (ref <150)
VLDL: 21 mg/dL (ref 0–40)

## 2010-05-19 LAB — GLUCOSE, CAPILLARY
Glucose-Capillary: 119 mg/dL — ABNORMAL HIGH (ref 70–99)
Glucose-Capillary: 127 mg/dL — ABNORMAL HIGH (ref 70–99)
Glucose-Capillary: 131 mg/dL — ABNORMAL HIGH (ref 70–99)
Glucose-Capillary: 144 mg/dL — ABNORMAL HIGH (ref 70–99)
Glucose-Capillary: 151 mg/dL — ABNORMAL HIGH (ref 70–99)
Glucose-Capillary: 154 mg/dL — ABNORMAL HIGH (ref 70–99)
Glucose-Capillary: 164 mg/dL — ABNORMAL HIGH (ref 70–99)
Glucose-Capillary: 173 mg/dL — ABNORMAL HIGH (ref 70–99)
Glucose-Capillary: 184 mg/dL — ABNORMAL HIGH (ref 70–99)
Glucose-Capillary: 210 mg/dL — ABNORMAL HIGH (ref 70–99)
Glucose-Capillary: 213 mg/dL — ABNORMAL HIGH (ref 70–99)
Glucose-Capillary: 133 mg/dL — ABNORMAL HIGH (ref 70–99)
Glucose-Capillary: 376 mg/dL — ABNORMAL HIGH (ref 70–99)

## 2010-05-19 LAB — TROPONIN I: Troponin I: 0.03 ng/mL (ref 0.00–0.06)

## 2010-05-19 LAB — BASIC METABOLIC PANEL
CO2: 25 mEq/L (ref 19–32)
Calcium: 9.4 mg/dL (ref 8.4–10.5)
Creatinine, Ser: 0.59 mg/dL (ref 0.4–1.2)
GFR calc Af Amer: >60 mL/min (ref >60)
GFR calc non Af Amer: >60 mL/min (ref >60)
Glucose, Bld: 109 mg/dL — ABNORMAL HIGH (ref 70–99)
Sodium: 143 mEq/L (ref 135–145)

## 2010-05-19 LAB — COMPREHENSIVE METABOLIC PANEL
AST: 17 U/L (ref 0–37)
Albumin: 3.2 g/dL — ABNORMAL LOW (ref 3.5–5.2)
Albumin: 3.5 g/dL (ref 3.5–5.2)
BUN: 6 mg/dL (ref 6–23)
BUN: 11 mg/dL (ref 6–23)
Calcium: 9.5 mg/dL (ref 8.4–10.5)
Calcium: 9.5 mg/dL (ref 8.4–10.5)
Creatinine, Ser: 0.61 mg/dL (ref 0.4–1.2)
Creatinine, Ser: 0.61 mg/dL (ref 0.4–1.2)
GFR calc Af Amer: >60 mL/min (ref >60)
Potassium: 3.5 mEq/L (ref 3.5–5.1)
Total Protein: 6.3 g/dL (ref 6.0–8.3)

## 2010-05-19 LAB — CBC
HCT: 41.6 % (ref 36.0–46.0)
Hemoglobin: 13.8 g/dL (ref 12.0–15.0)
Hemoglobin: 14.6 g/dL (ref 12.0–15.0)
MCHC: 34 g/dL (ref 30.0–36.0)
MCHC: 34.8 g/dL (ref 30.0–36.0)
MCV: 102.3 fL — ABNORMAL HIGH (ref 78.0–100.0)
Platelets: 247 10*3/uL (ref 150–400)
Platelets: 263 10*3/uL (ref 150–400)
RDW: 12.9 % (ref 11.5–15.5)
RDW: 13.1 % (ref 11.5–15.5)
RDW: 13.2 % (ref 11.5–15.5)

## 2010-05-19 LAB — URINALYSIS, ROUTINE W REFLEX MICROSCOPIC
Glucose, UA: 250 mg/dL — AB
Hgb urine dipstick: NEGATIVE
Specific Gravity, Urine: 1.019 (ref 1.005–1.030)
Urobilinogen, UA: 0.2 mg/dL (ref 0.0–1.0)
pH: 7 (ref 5.0–8.0)

## 2010-05-19 LAB — DIFFERENTIAL
Lymphocytes Relative: 7 % — ABNORMAL LOW (ref 12–46)
Lymphs Abs: 0.9 10*3/uL (ref 0.7–4.0)
Monocytes Absolute: 1 10*3/uL (ref 0.1–1.0)
Monocytes Relative: 8 % (ref 3–12)
Neutro Abs: 10.9 10*3/uL — ABNORMAL HIGH (ref 1.7–7.7)

## 2010-05-19 LAB — URINE MICROSCOPIC-ADD ON

## 2010-05-19 LAB — CARDIAC PANEL(CRET KIN+CKTOT+MB+TROPI)
CK, MB: 1.8 ng/mL (ref 0.3–4.0)
CK, MB: 1.9 ng/mL (ref 0.3–4.0)
Relative Index: INVALID (ref 0.0–2.5)
Troponin I: 0.03 ng/mL (ref 0.00–0.06)

## 2010-05-19 LAB — CK TOTAL AND CKMB (NOT AT ARMC)
CK, MB: 1.3 ng/mL (ref 0.3–4.0)
Relative Index: INVALID (ref 0.0–2.5)
Total CK: 42 U/L (ref 7–177)

## 2010-05-19 LAB — MAGNESIUM: Magnesium: 1.8 mg/dL (ref 1.5–2.5)

## 2010-05-19 LAB — HEMOGLOBIN A1C
Hgb A1c MFr Bld: 6.7 % — ABNORMAL HIGH (ref 4.6–6.1)
Mean Plasma Glucose: 146 mg/dL

## 2010-05-19 LAB — PHOSPHORUS: Phosphorus: 3.7 mg/dL (ref 2.3–4.6)

## 2010-05-19 LAB — APTT: aPTT: 35 seconds (ref 24–37)

## 2010-05-21 ENCOUNTER — Encounter (HOSPITAL_COMMUNITY)
Admission: RE | Admit: 2010-05-21 | Discharge: 2010-05-21 | Disposition: A | Discharge status: Home / Self Care | Payer: Medicare Other | Source: Ambulatory Visit | Attending: General Surgery | Admitting: General Surgery

## 2010-05-21 DIAGNOSIS — K8020 Calculus of gallbladder without cholecystitis without obstruction: Secondary | ICD-10-CM

## 2010-05-21 DIAGNOSIS — R1011 Right upper quadrant pain: Secondary | ICD-10-CM | POA: Insufficient documentation

## 2010-05-21 MED ORDER — SINCALIDE 5 MCG IJ SOLR: 0.0200 ug/kg | Freq: Once | INTRAMUSCULAR | Status: DC

## 2010-05-21 MED ORDER — TECHNETIUM TC 99M MEBROFENIN IV KIT
5.3000 | PACK | Freq: Once | INTRAVENOUS | Status: AC | PRN
  Administered 2010-05-21: 5.3 via INTRAVENOUS

## 2010-05-24 ENCOUNTER — Observation Stay (HOSPITAL_COMMUNITY)
Admission: AD | Admit: 2010-05-24 | Discharge: 2010-05-25 | Disposition: A | Discharge status: Nursing Home (Includes ICF) | Payer: Medicare Other | Source: Skilled Nursing Facility | Attending: Endocrinology | Admitting: Endocrinology

## 2010-05-24 DIAGNOSIS — I509 Heart failure, unspecified: Secondary | ICD-10-CM | POA: Insufficient documentation

## 2010-05-24 DIAGNOSIS — Z794 Long term (current) use of insulin: Secondary | ICD-10-CM | POA: Insufficient documentation

## 2010-05-24 DIAGNOSIS — K5520 Angiodysplasia of colon without hemorrhage: Secondary | ICD-10-CM | POA: Insufficient documentation

## 2010-05-24 DIAGNOSIS — E785 Hyperlipidemia, unspecified: Secondary | ICD-10-CM | POA: Insufficient documentation

## 2010-05-24 DIAGNOSIS — E119 Type 2 diabetes mellitus without complications: Secondary | ICD-10-CM | POA: Insufficient documentation

## 2010-05-24 DIAGNOSIS — D649 Anemia, unspecified: Principal | ICD-10-CM | POA: Insufficient documentation

## 2010-05-24 DIAGNOSIS — Z79899 Other long term (current) drug therapy: Secondary | ICD-10-CM | POA: Insufficient documentation

## 2010-05-24 DIAGNOSIS — Z8673 Personal history of transient ischemic attack (TIA), and cerebral infarction without residual deficits: Secondary | ICD-10-CM | POA: Insufficient documentation

## 2010-05-24 DIAGNOSIS — I10 Essential (primary) hypertension: Secondary | ICD-10-CM | POA: Insufficient documentation

## 2010-05-24 DIAGNOSIS — Z7982 Long term (current) use of aspirin: Secondary | ICD-10-CM | POA: Insufficient documentation

## 2010-05-24 LAB — GLUCOSE, CAPILLARY: Glucose-Capillary: 249 mg/dL — ABNORMAL HIGH (ref 70–99)

## 2010-05-24 LAB — VITAMIN B12: Vitamin B-12: 287 pg/mL (ref 211–911)

## 2010-05-24 LAB — RETICULOCYTES
RBC.: 3 MIL/uL — ABNORMAL LOW (ref 3.87–5.11)
Retic Count, Absolute: 63 10*3/uL (ref 19.0–186.0)

## 2010-05-25 LAB — BASIC METABOLIC PANEL
BUN: 16 mg/dL (ref 6–23)
Calcium: 9.2 mg/dL (ref 8.4–10.5)
GFR calc non Af Amer: 58 mL/min — ABNORMAL LOW (ref >60)
Glucose, Bld: 136 mg/dL — ABNORMAL HIGH (ref 70–99)

## 2010-05-25 LAB — CBC
HCT: 31 % — ABNORMAL LOW (ref 36.0–46.0)
MCH: 25.7 pg — ABNORMAL LOW (ref 26.0–34.0)
MCHC: 31.3 g/dL (ref 30.0–36.0)
MCV: 82.2 fL (ref 78.0–100.0)
RDW: 15.1 % (ref 11.5–15.5)

## 2010-05-26 LAB — CROSSMATCH
Antibody Screen: NEGATIVE
Unit division: 0

## 2010-06-17 NOTE — H&P (Signed)
NAME:  Michelle Bowers, Michelle Bowers             ACCOUNT NO.:  [id number]  MEDICAL RECORD NO.:  [id number]           PATIENT TYPE:  O  LOCATION:  1304                         FACILITY:  [facility]  PHYSICIAN:  Tera Mater. Evlyn Kanner, M.D. DATE OF BIRTH:  1930/02/24  DATE OF ADMISSION:  05/24/2010 DATE OF DISCHARGE:                             HISTORY & PHYSICAL   Michelle Bowers is a 77 year old white female patient, under my care at [facility].  She is presenting now for transfusion of her symptomatic anemia.  She has had a multitude of recent medical interactions with most of the centered around what we thought could be gallbladder disease.  A CT of the abdomen and pelvis back in March, suggested that there were several gallstones, largest being 14 mm with no gallbladder wall thickening.  The bile duct was at upper limit of normal.  She did have a consult with surgeon and a hepatobiliary scan was completed 3 days ago, which showed normal examination with a cyst in common bile duct, patent, and a gallbladder ejection fraction of 57%. In addition, we have been dealing with persistent and recurrent anemia. We transfused her one other time, I tried to avoid this last time by giving her some Aranesp last week, we had basically no effect at all. She is chronically on iron supplements.  She has had some GI consultations in the past with Dr. Arlyce Dice and basically we are having difficulty keeping this up.  Because this went down to as low as 6.9 and with her other comorbid conditions, she is now brought in for transfusion for symptomatic anemia.  At the present time, her appetite is doing well.  Her bowels are working about every other day.  She has seen no overt bleeding.  She is having no abdominal pain.  She is having no shortness of breath.  She is having no chest pain.  She is in reasonable spirits.  Her stroke deficits are unchanged.  PAST MEDICAL HISTORY: 1. History of cecal arteriovenous  malformations. 2. Benign left adrenal mass. 3. She has had left brain CVA in July 2010. 4. Hypertension. 5. Transient bradycardia. 6. Type 2 diabetes. 7. Chronic anemia. 8. Chronic lower extremity edema. 9. Degenerative joint disease. 10.Remote hysterectomy. 11.Breast implants. 12.Grade 1 diastolic dysfunction with preserved ejection fraction. 13.She has had some dysphagia, this now resolved.  Her medication list from the nursing home today includes, 1. Norvasc 10. 2. Aspirin 81. 3. Cymbalta 30. 4. Fish oil 1000. 5. Florastor 250. 6. Lasix 40. 7. Amaryl 4. 8. Prevacid 30. 9. Plavix 75. 10.Vitamin D 1000 units daily. 11.Nu-Iron twice daily. 12.Apresoline 100 three times a day. 13.Lialda 1.2 g 2 tablets daily. 14.She is on a sliding scale of Humalog. 15.She is on 5 units of Lantus twice daily.  FAMILY HISTORY:  Her father died at 109 of stomach cancer.  Mother died at 82 of heart failure.  The patient is divorced.  Daughter is here in the room whether she has 1 other child.  She has been a 1 pack a day smoker for 55 years.  PHYSICAL EXAMINATION:  VITAL SIGNS:  On exam  here at [facility], her blood pressure 138/71, pulse 88, respirations 12, temperature 97.6, O2 sat is 95% on room air. GENERAL:  We have a thin, but fairly nontoxic-appearing white female sitting up in no distress, eating dinner.  Face is a bit weak on the right side. HEENT:  Sclerae anicteric.  Oral mucous membranes are moist. NECK:  Supple with a transmitted murmur to the right neck. LUNGS:  Clear without wheezes, rales, or rhonchi. HEART:  Regular with a harsh 3/6 systolic aortic murmur. ABDOMEN:  Soft, nontender, nondistended with no masses or pulsations. EXTREMITIES:  Reveal pulses to be relatively well preserved, some wasting of the right side is noted.  Mild edema is present. NEURO:  The patient is awake, alert.  Her mentation is good.  Speech is fluent and clear.  She is mentating well.  No resting  tremors present.  Hemoglobin was 6.9 at the nursing home.  In summary, have a 77 year old with symptomatic anemia, brought in for transfusion.  It is interesting she got virtually no effect whatsoever with an Aranesp infusion.  We are going to check her anemia panel and she may need hematology evaluation depending on how her anemia does. Blood pressure is under reasonable control.  No evidence of heart failure at the present time.  Blood sugars are fair.  She was 249 before supper.  We will blister dose a bit.  Her cerebrovascular status is stable.  No pain management issues are present.          ______________________________ Tera Mater. Evlyn Kanner, M.D.     SAS/MEDQ  D:  05/24/2010  T:  05/25/2010  Job:  [id number]  Electronically Signed by Michelle Bowers M.D. on 06/17/2010 12:44:49 PM

## 2010-06-17 NOTE — Discharge Summary (Signed)
  NAME:  Michelle Bowers, Michelle Bowers             ACCOUNT NO.:  [id number]  MEDICAL RECORD NO.:  [id number]           PATIENT TYPE:  O  LOCATION:  1304                         FACILITY:  [facility]  PHYSICIAN:  Tera Mater. Evlyn Kanner, M.D. DATE OF BIRTH:  [birth year]  DATE OF ADMISSION:  05/24/2010 DATE OF DISCHARGE:  05/25/2010                              DISCHARGE SUMMARY   DISCHARGE DIAGNOSES: 1. Symptomatic severe anemia, now status post transfusion. 2. Significant iron deficiency, likely from colonic arterial venous     malformations. 3. Hypertension with stable levels. 4. History of stroke. 5. Prior congestive heart failure with no evidence of worsening during     the transfusion. 6. Type 2 diabetes with reasonable control and fasting of 160. 7. Hyperlipidemia on therapy.  PROCEDURES:  Included transfusion of two units of packed red blood cells.  CONSULTATIONS:  None.  HISTORY:  [name] is a 77 year old white female under my care at [facility].  She presented yesterday at my request for transfusion.  Due to the late hour, she had to be admitted overnight. She has now received 2 units of packed red blood cells and is doing well.  She is lying flat without any dyspnea and has no neck vein distention or lung findings.  Her blood pressure is excellent at 121/61, pulse is stable at 74, respirations 14, temperature 96, O2 saturation 94% on room air.  Her hemoglobin has improved significantly from the 6.9 pretransfusion to 9.7 now.  Iron studies were done, showing a low iron at 12, a low ferritin at 10, a normal folic acid at greater than 20, a normal B12 at 287 and a fair reticulocyte count at 63,000.  The patient tolerated this transfusion without difficulty.  She is to return to [place] under my care today.  We will give her a dose of IV iron prior to leaving.  DISCHARGE MEDICATIONS:  Her medications upon transfer back will include resumption of everything she was on before.   Specifically that includes: 1. Norvasc 10 once daily. 2. Aspirin 81 once daily. 3. Plavix 75 once daily. 4. Cymbalta 30 once daily. 5. Lasix 40 once daily. 6. Amaryl 4 mg each morning. 7. Hydralazine 100 three times daily. 8. The sliding-scale NovoLog that she was on before. 9. The Lantus will now be 8 units b.i.d. 10.She is on Nu-Iron 150 twice daily. 11.She is on Lialda 2.4 mg once daily. 12.She is on her proton pump inhibitor, which is Prevacid 30 daily. 13.She is on her Florastor as before.  DISCHARGE INSTRUCTIONS:  She is to resume her no concentrated sweets diet.  She is to have pre meals and bedtime sugar checks.  CODE STATUS:  Her code status remains Full Code.  FOLLOWUP:  I will see her back at the nursing home.          ______________________________ Tera Mater. Evlyn Kanner, M.D.     SAS/MEDQ  D:  05/25/2010  T:  05/25/2010  Job:  [id number]  Electronically Signed by Adrian Prince M.D. on 06/17/2010 12:44:46 PM

## 2010-06-25 NOTE — Discharge Summary (Signed)
NAMEJAYLN, BRANSCOM             ACCOUNT NO.:  [id number]   MEDICAL RECORD NO.:  [id number]          PATIENT TYPE:  INP   LOCATION:  3715                         FACILITY:  [facility]   PHYSICIAN:  Elliot Cousin, M.D.    DATE OF BIRTH:  05-06-1930   DATE OF ADMISSION:  08/24/2008  DATE OF DISCHARGE:                               DISCHARGE SUMMARY   DISCHARGE DIAGNOSES:  1. Acute left brain stroke with residual right-sided hemiparesis,      dysarthria, and dysphagia.  (MRI of the brain on August 24, 2008      revealed an acute infarct in the left external capsule and      periventricular white matter).  2. Advanced atherosclerotic disease and peripheral vascular disease.      (The MRA of the brain and neck on July 15 revealed moderate chronic      microvascular ischemia, left subclavian artery with moderate      stenosis, irregular stenosis in the left common carotid artery and      the right subclavian artery, and moderately severe stenosis at the      left middle cerebral artery bifurcation).  The carotid duplex      Doppler study performed on August 24, 2008 revealed severe plaque      disease without significant internal carotid artery stenosis      bilaterally.  3. Mild to moderate left ventricular hypertrophy, ejection fraction 65-      70%, grade 1 diastolic dysfunction per two-dimensional      echocardiogram on August 24, 2008.  4. Tobacco abuse.  5. Mild dyslipidemia.  The patient refuses to take ongoing Zocor.  6. Hypertension.  7. Type 2 diabetes mellitus.  Hemoglobin A1c was 6.7.  8. Mild depression.  Lexapro started.  9. Abnormal enhancement in the right cerebellum possibly secondary to      an atypical venous anomaly.  (THE PATIENT WILL NEED A FOLLOWUP MRI      OF THE BRAIN IN 6-12 MONTHS.)   MEDICATIONS:  1. Norvasc 10 mg daily.  2. Aspirin 81 mg daily.  3. Plavix 75 mg daily.  4. Lexapro 5 mg daily.  5. Ferrous sulfate 325 mg daily.  6. Amaryl 2 mg b.i.d. to be  titrated to 4 mg b.i.d.  7. Metformin 500 mg b.i.d. to be titrated to 850 mg b.i.d.  8. Lantus insulin 15 units subcu nightly.  9. Metoprolol 100 mg in the morning and 50 mg in the evening.  10.Diovan 320 mg daily.  11.Protonix 40 mg daily.   DISPOSITION:  The patient is approaching medical stability.  The plan is  to discharge her to a skilled nursing facility of choice for ongoing  rehabilitation.   CONSULTATIONS:  Rehabilitation medicine physician.   PROCEDURES PERFORMED:  1. MRI of the brain and MRA of the brain and neck on August 24, 2008.  2. 2-D echocardiogram on August 24, 2008.  3. Carotid duplex study on August 24, 2008.   HISTORY OF PRESENT ILLNESS:  The patient is a 77 year old woman with a  past medical history significant for type 2 diabetes  mellitus,  hypertension, tobacco abuse and chronic anemia.  She presented to the  emergency department on August 24, 2008 with a chief complaint of slurred  speech and weakness in her right arm and right leg.  When the patient  went to sleep the night before, she had no such symptoms.  However, when  she woke up on the morning of admission at 4:30 a.m., she realized her  symptoms.  She called 9-1-1.  She was brought to the emergency  department.  During the evaluation in the emergency department, a CT  scan of the head was ordered and it revealed no acute intracranial  abnormality.  However, there was evidence of extensive chronic  microvascular matter disease and diffuse mild cerebral atrophy.  She was  afebrile and mildly hypertensive with a blood pressure of 165/56.  She  was admitted for further evaluation and management.   For additional details please see the dictated history and physical.   HOSPITAL COURSE:  1. ACUTE LEFT BRAIN STROKE, ADVANCED CEREBRAL ATHEROSCLEROTIC DISEASE,      PERIPHERAL VASCULAR DISEASE, RIGHT-SIDED HEMIPARESIS, DYSARTHRIA      AND DYSPHAGIA.  The  patient's swallowing was evaluated in the      emergency  department and apparently she passed according to the ED      nurse.  However, when she was admitted to the floor, the registered      nurse who was attending to her noticed that the patient was having      more difficulty speaking and swallowing.  Therefore, the speech      therapist was consulted and the patient's diet was downgraded to      dysphagia 1 with thin liquids.  The speech therapist followed the      patient throughout the hospitalization.  With her followup      assessment, she noted that the patient was having some coughing      with thin liquids.  Therefore the liquids were changed to nectar      thickened liquids.  She was started on aspirin therapy 325 mg      daily.  For further evaluation, a number of studies were ordered      including an MRI of the brain, MRA of the brain, MRA of the neck,      carotid duplex, 2-D echocardiogram and a number of laboratory      studies.  The results of the MRI and MRA are above.  In essence,      the studies were significant for an acute left brain stroke, severe      atherosclerotic disease, moderately severe stenosis in the left MCA      bifurcation and moderate stenosis of the left subclavian artery.      Given these findings, the patient was started on Plavix in addition      to aspirin.  The dose of aspirin was decreased to 81 mg daily.  The      2-D echocardiogram revealed LVH and grade 1 diastolic dysfunction      but no evidence of a cardioembolic source.  Her fasting lipid      profile revealed a total cholesterol of 193, triglycerides of 104,      HDL cholesterol of 67, and LDL cholesterol of 105.  Zocor was      started not only for dyslipidemia but also for additional treatment      of  peripheral vascular and cerebrovascular disease.  After the  patient had been treated with Zocor for 2 days, she refused to take      more as she stated that the Zocor had caused muscle pain in the      past.  Her cardiac panel was  within normal limits.  Her urinalysis      was positive for protein however, negative for nitrites and      leukocytes.  Her TSH was within normal limits at 1.105 and her      hemoglobin A1c was 6.7.  The patient was evaluated by both the      physical and occupational therapists.  Per their assessment, she      would be an excellent candidate for inpatient rehabilitation at      [facility].  The rehabilitation physician, Dr. Jodean Lima,      evaluated the patient and recommended CIR if the family could      provide close to 24-hour assistance at home or skilled nursing      facility placement if the family could not provide assistance      following rehab.  The family would not be able to provide      assistance at home following inpatient rehabilitation and therefore      skilled nursing facility placement has been pursued.  The search      for a skilled nursing facility bed has been started.  The patient      continues to have right-sided hemiparesis, dysarthria and      dysphagia.  However, she is completely alert and oriented.  2. HYPERTENSION.  The patient was restarted on Diovan and metoprolol.      However, initially the Norvasc was withheld.  In light of her      increasing blood pressures, the Norvasc was restarted.  Over the      past 24-48 hours, the patient's systolic blood pressures have been      ranging in the 160s.  IV fluids had been started initially however,      the IV fluids may have been contributing to the elevated blood      pressures.  Therefore the IV fluids were discontinued today.      Hydralazine at 10 mg t.i.d. to q.i.d. may be added if her systolic      blood pressures remain significantly elevated.  3. ABNORMAL ENHANCEMENT IN THE RIGHT CEREBELLUM.  So noted on the MRI      of the brain.  According to the radiologist, the enhancement could      be an atypical venous anomaly.  This was certainly an incidental      finding and further evaluation was  not pursued during this      hospitalization.  Rather, a followup MRI electively in 6-12 months      would be adequate for interval assessment.  4. TYPE 2 DIABETES MELLITUS.  Over the past 2-3 days, the patient's      capillary blood glucose has been uncontrolled.  Amaryl and      metformin were not restarted at the time of the initial hospital      assessment.  Sliding scale NovoLog and Lantus were initiated.      However, in light of the patient's improving p.o. intake, metformin      and Amaryl will be restarted at lower doses.  The doses may be      titrated accordingly.  Her hemoglobin A1c was found to be 6.7.  Of  note, prior to this hospitalization, the patient was not treated      with insulin.  5..  TOBACCO ABUSE.  The patient had a long history of smoking one pack  of cigarettes per day for more than 50 years.  She was strongly advised  to quit smoking.  She was counseled on tobacco cessation.   DISCHARGE DISPOSITION:  The patient is currently approaching clinical  stability.  It is anticipated the patient will be clinically stable  enough to be discharged to a skilled nursing facility of choice within  the next 24-48 hours.      Elliot Cousin, M.D.  Electronically Signed     DF/MEDQ  D:  08/27/2008  T:  08/27/2008  Job:  [id number]   cc:   Lovenia Kim, D.O.  Barbette Hair. Arlyce Dice, MD,FACG

## 2010-06-25 NOTE — Assessment & Plan Note (Signed)
[facility]                             PULMONARY OFFICE NOTE   NAME:Michelle Bowers, Ventress                    MRN:          [id number]  DATE:06/25/2006                            DOB:          [birth year]    This is a very pleasant 77 year old white female whom I actually have  not seen here since June of 2007. At that time, I had seen her post-  hospitalization. She had been hospitalized from the 16 of May to the  19th of May. She had been noted to have mediastinal adenopathy in the  setting of congestive heart failure with negative bronchoscopy and Wang  needle aspirate. At that time, we requested repeat CT and the patient  never had that done. She was supposed to followup in 4 to 6 weeks and  never followed up. Since then, she really has not had any issues. She  seems to be concerned about potential for edema to recur in the lungs.  My recollection is that at that time, during her hospitalization, we  could only attribute her edema from a side effect from Actos, which has  since been discontinued. The patient continues to smoke approximately a  half pack of cigarettes per day. She denies any fevers, chills or  sweats. She has not had any hemoptysis. The patient denies any other  symptomatology. She is currently not using any inhalers though she has  been given apparently Spiriva in the past to use, but she is not  utilizing this medication.   CURRENT MEDICATIONS:  As noted on the intake sheet. These have been  reviewed and are accurate.   PHYSICAL EXAMINATION:  VITAL SIGNS: As noted. Oxygen saturation is 94%  on room air.  In general, this is a well-developed, well-nourished female who is in no  acute distress.  HEENT: Examination is unremarkable.  CARDIAC: Regular rate and rhythm. No rubs, murmurs, gallops.  LOWER EXTREMITIES: No cyanosis, clubbing or edema noted.   We did perform spirometry today which shows an FEV1 of 1.24 liters rate  or 63%  of predicted, with an FEV1 of 67% predicted. Her FEF 25/75 is  markedly decreased at 44% predicted.   Chest x-ray shows no acute infiltrates and no acute process. She does  have COPD changes.   IMPRESSION:  1. Chronic obstructive pulmonary disease with asthmatic bronchitic      component as evidenced by small airways component.  2. Tobacco abuse dependence.  3. History of mediastinal adenopathy in the setting of congestive      heart failure.   PLAN:  1. Will be for the patient to attempt discontinuation of cigarettes. I      have counseled her extensively in this regard.  2. I have instituted therapy with Advair 100/50 one inhalation twice a      day. The patient was taught use of the inhaler. She also showed      proficiency of the same.  3. Followup here will be on a p.r.n. basis. She will follow with a      primary care physician, Dr.  Elisabeth Most. The patient  is aware that I      will be leaving the practice. At present, I do not think      she needs ongoing pulmonary management as her Advair can be filled      by her primary care physician.     Gailen Shelter, MD     CLG/MedQ  DD: 06/25/2006  DT: 06/25/2006  Job #: [id number]   cc:   Lovenia Kim, D.O.

## 2010-06-25 NOTE — Assessment & Plan Note (Signed)
[facility]                         GASTROENTEROLOGY OFFICE NOTE   NAME:[name], Michelle Bowers                    MRN:          [id number]  DATE:12/28/2006                            DOB:          [birth year]    PROBLEM:  Abnormal CT.   [name] has returned for reevaluation.  A CT scan demonstrated a  stable left adrenal mass.  [name] has no GI complaints, including  abdominal pain, change in bowel habits, melena, or hematochezia.  She  has a history of AVM involving the cecum that were obliterated using the  APC.  She informed me that her blood counts had recently been stable.   EXAM:  Pulse 56, blood pressure 130/68, weight 136.  HEENT:  EOMI. PERRLA. Sclerae are anicteric.  Conjunctivae are pink.  NECK:  Supple without thyromegaly, adenopathy or carotid bruits.  CHEST:  Clear to auscultation and percussion without adventitious  sounds.  CARDIAC:  Regular rhythm; normal S1 S2.  There are no murmurs, gallops  or rubs.  ABDOMEN:  Bowel sounds are normoactive.  Abdomen is soft, non-tender and  non-distended.  There are no abdominal masses, tenderness, splenic  enlargement or hepatomegaly.  EXTREMITIES:  Full range of motion.  No cyanosis, clubbing or edema.  RECTAL:  Deferred.   IMPRESSION:  1. Benign left adrenal mass.  2. History of cecal arteriovenous malformations.   RECOMMENDATION:  No further GI workup at this point.     Barbette Hair. Arlyce Dice, MD,FACG  Electronically Signed    RDK/MedQ  DD: 12/28/2006  DT: 12/28/2006  Job #: [id number]   cc:   Lovenia Kim, D.O.

## 2010-06-25 NOTE — Discharge Summary (Signed)
Michelle Bowers, Michelle Bowers             ACCOUNT NO.:  [id number]   MEDICAL RECORD NO.:  [id number]          PATIENT TYPE:  INP   LOCATION:  3715                         FACILITY:  [facility]   PHYSICIAN:  Richarda Overlie, MD       DATE OF BIRTH:  11-10-1930   DATE OF ADMISSION:  08/24/2008  DATE OF DISCHARGE:  08/29/2008                               DISCHARGE SUMMARY   Kindly see discharge summary from July 18 and 19, 2010, for final  discharge diagnosis and further details of the patient's  hospitalization.   INTERIM HOSPITAL COURSE:  1. Acute left brain stem stroke.  The patient was found to be stable      from this standpoint.  The patient has had eveidence of      oropharyngeal dysphagia based on swallow evaluation  , and will be      receiving a barium swallow evaluation prior to her discharge today.      Until then, the patient will continue on nectar-thick liquids with      supervision.  Ensure pudding has been added three times a day.      Once the barium swallow evaluation is completed by the speech      therapist, final swallowing recommendations will be made, and the      patient will be able to be discharged to skilled nursing facility      today.  2. Hypertension with bradycardia.  The patient's beta blocker has been      decreased, and the patient's hydralazine dose is being increased.   DISCHARGE MEDICATIONS:  Remain unchanged.  The only changes that are  being made are as follows:  1. Metoprolol 50 mg p.o. twice a day, hold for heart rate less than      60.  2. Hydralazine 100 mg p.o. three times a day.   DISPOSITION:  The patient can be discharged today after her barium  swallow is completed and final speech recommendations have been made.      Richarda Overlie, MD  Electronically Signed     Richarda Overlie, MD  Electronically Signed    NA/MEDQ  D:  08/29/2008  T:  08/29/2008  Job:  [id number]

## 2010-06-25 NOTE — H&P (Signed)
NAME:  Michelle Bowers, Michelle Bowers             ACCOUNT NO.:  [id number]   MEDICAL RECORD NO.:  [id number]          PATIENT TYPE:  INP   LOCATION:  1828                         FACILITY:  [facility]   PHYSICIAN:  Richarda Overlie, MD       DATE OF BIRTH:  06-01-1930   DATE OF ADMISSION:  08/24/2008  DATE OF DISCHARGE:                              HISTORY & PHYSICAL   PRIMARY CARE PHYSICIAN:  [facility].   OBJECTIVE:  This is a 77 year old female who presents to the emergency  room with the chief complaint that the patient gives of slurred speech  and  inability to use her right upper and right lower extremity.  The  patient went to sleep around 10 o'clock and woke up with these symptoms  around 4:30 this morning.  The patient called 9-1-1 because she was  concerned about these symptoms.  The patient denies any chest pain,  palpitations .  She denied any headache, blurry vision.  She has not had  any history of prior CVA.  The patient smokes a pack per day.   PAST MEDICAL HISTORY:  1. Type 2 diabetes mellitus.  2. Hypertension.  3. Tobacco use.  4. Degenerative joint disease.  5. Chronic bilateral lower extremity edema.  6. Chronic anemia.   PAST SURGICAL HISTORY:  1. Upper and lower endoscopy by Dr. Arlyce Dice.  2. Status post vaginal hysterectomy in 1976 for fibroid uterus.  3. Status post bilateral breast implants in 1976.   ALLERGIES:  CODEINE.   HOME MEDICATIONS:  1. Norvasc 10 mg p.o. daily.  2. Ferrous sulfate 325 mg p.o. daily.  3. Diovan 320 mg p.o. daily.  4. Glimepiride 4 mg p.o. twice a day.  5. Metformin 850 mg p.o. three times a day.  6. Metoprolol 100 mg in the morning and 50 mg in the evening.   REVIEW OF SYSTEMS:  As documented in the HPI.   SOCIAL HISTORY:  The patient is divorced, lives in [place], [place].  Has 2 children.  She is retired from [employer].  Currently  smokes 1 pack per day for the last 55 years.  Denies use of alcohol.   FAMILY  HISTORY:  Father died of stomach cancer at age 9.  Mother died  of heart failure at age 51.   PHYSICAL EXAMINATION:  VITAL SIGNS:  Blood pressure 165/76, pulse 70,  respirations 18 and temperature 98.2.  GENERAL:  Patient is currently alert, awake and comfortable, no acute  distress.  HEENT:  Normocephalic, atraumatic.  Left facial droop noted.  Extraocular movements are intact.  Sclerae anicteric.  Oral mucosa is  dry.  NECK:  Supple without JVD, no thyromegaly, no carotid bruits.  LUNGS:  Clear to auscultation bilaterally without any crackles or  wheezing.  HEART:  Regular rate and rhythm.  No appreciable murmurs, rubs or  gallops.  ABDOMEN:  Soft and nontender, no hepatosplenomegaly.  NEUROLOGIC:  Left facial droop noted.  The patient has decreased motor  strength 3/5 in right upper and right lower extremity.  Deep tendon  reflexes and positive  Babinski on the right. The patient is unable to  perform finger-nose testing.   LABORATORY DATA:  WBC 13.2, hemoglobin 14.7, hematocrit 41.2, platelet  count of 63,000.  INR is 0.9.  Sodium 141, potassium 3.5, chloride 108,  bicarb 24, glucose 210, BUN 11, creatinine 0.61.  Total bilirubin 0.5,  alkaline phosphatase 29, AST 18, ALT 15, total protein 6.3, albumin 3.5,  calcium 9.5, creatine kinase 52, troponin 0.03.   CT scan of the head without contrast shows no acute abnormalities,  vascular changes with mild diffuse cerebral atrophy.  Chest x-ray with  left lower lobe scarring.   ASSESSMENT AND PLAN:  1. Acute left middle cerebral artery stroke.  Based on the patient's      duration of symptoms, she is outside the window of receiving      thrombolytic therapy.  The patient will be started on aspirin,      initiate a stroke workup with MRI/MRA of the brain and 2-D      echocardiogram.  Will also get carotid ultrasound.  The patient      will be monitored with serial neurologic checks.  Get serial      troponins and EKG to rule out an  acute coronary syndrome concurrent      with the stroke.  Risk factor modification.  Will check a lipid      panel, hemoglobin A1c.  Smoking cessation counseling will be given.      PT and OT consultation has also been requested.  2. Hypertension.  The patient will be continued on Diovan for blood      pressure control.  Will hold her Norvasc.  Will continue beta      blocker.  3. Diabetes.  Will hold the metformin and glimepiride and will      continue patient on sliding scale insulin while the patient is      n.p.o. pending speech evaluation.   Patient is a full code.      Richarda Overlie, MD  Electronically Signed     NA/MEDQ  D:  08/24/2008  T:  08/24/2008  Job:  [id number]

## 2010-06-25 NOTE — Discharge Summary (Signed)
NAME:  Michelle Bowers, Michelle Bowers             ACCOUNT NO.:  [id number]   MEDICAL RECORD NO.:  [id number]          PATIENT TYPE:  INP   LOCATION:  3715                         FACILITY:  [facility]   PHYSICIAN:  Richarda Overlie, MD       DATE OF BIRTH:  12-11-1930   DATE OF ADMISSION:  08/24/2008  DATE OF DISCHARGE:  08/28/2008                               DISCHARGE SUMMARY   ADDENDUM   Kindly see discharge summary from August 27, 2008 Dr. Elliot Cousin for  further details of the patient's current hospitalization.   DISCHARGE MEDICATIONS:  1. Aspirin 81 mg p.o. daily.  2. Plavix 75 mg p.o. daily.  3. Lexapro 5 mg p.o. daily.  4. Lantus 15 units subcutaneous nightly.  5. Zantac 150 mg p.o. b.i.d.  6. K-Dur 20 mEq p.o. daily for 5 days.  7. Ferrous sulfate 325 p.o. daily.  8. Norvasc 10 mg p.o. daily.  9. Diovan 320 mg p.o. daily.  10.Glimepiride 2 mg p.o. b.i.d. and increase to 4 mg p.o. b.i.d. in 7      days.  11.Metformin 850 mg p.o. b.i.d.  12.Metoprolol 100 mg p.o. in the morning and 50 mg p.o. in the      evening.  13.Hydralazine 50 mg p.o. t.i.d.   HOSPITAL COURSE:  1. Acute left brain stem stroke with advanced cerebral atherosclerotic      disease and peripheral vascular disease.  The patient continues to      make improvement in terms of her right-sided weakness.  The patient      had a speech and swallow eval that showed mild oropharyngeal      dysphagia characterized by oropharyngeal muscular weakness with      decreased epiglottic deflection.  The patient will require      supervision and she should be allowed to drink only when she is      alert and upright at 90 degrees with small bites/sips, swallow two      to three times after each bite.  Reflux precautions in the upright      position at all times.  2. The patient also was found to have venous anomaly in her right      cerebellum and would need a repeat MRI in 3 - 6 months.  The      patient declined to use Zocor for her  dyslipidemia or any other      statins because of prior history of muscle pain and this has been      discontinued.  3. Hypertension.  The patient's blood pressure was still quite      somewhat elevated at the time of discharge and      therefore hydralazine has been added to her regimen in addition to      p.o. Diovan, Norvasc and metoprolol.  4. Type 2 diabetes.  The patient will be continued on Lantus and will      be restarted on metformin and Amaryl.   Discharge to rehab when bed available.      Richarda Overlie, MD  Electronically Signed  Richarda Overlie, MD  Electronically Signed    NA/MEDQ  D:  08/28/2008  T:  08/28/2008  Job:  [id number]

## 2010-06-27 ENCOUNTER — Encounter (INDEPENDENT_AMBULATORY_CARE_PROVIDER_SITE_OTHER): Payer: Self-pay | Admitting: General Surgery

## 2010-06-28 NOTE — H&P (Signed)
NAME:  Michelle Bowers, Michelle Bowers             ACCOUNT NO.:  [id number]   MEDICAL RECORD NO.:  [id number]          PATIENT TYPE:  EMS   LOCATION:  MAJO                         FACILITY:  [facility]   PHYSICIAN:  Elliot Cousin, M.D.    DATE OF BIRTH:  1930/05/15   DATE OF ADMISSION:  06/25/2005  DATE OF DISCHARGE:                                HISTORY & PHYSICAL   PRIMARY CARE PHYSICIAN:  Lovenia Kim, D.O.   CARDIOLOGIST:  Learta Codding, M.D.   GASTROENTEROLOGIST:  Barbette Hair. Arlyce Dice, M.D.   CHIEF COMPLAINT:  Chest pain and shortness of breath.   HISTORY OF PRESENT ILLNESS:  The patient is a 77 year old lady with a past  medical history significant for hypertension, type 2 diabetes mellitus, and  degenerative joint disease, who presents to the emergency department with a  two day history of chest pain and shortness of breath.  The pain started two  nights ago.  She was having difficulty sleeping and therefore was not  awakened from her sleep with pain.  The pain is described as a tightness.  It has been intermittent and only occurs when she lies flat.  The pain is  associated with shortness of breath, particularly when she lies flat.  The  pain and shortness of breath improve when she sits up.  She denies pleurisy  or chest wall pain.  She denies associated radiation of the pain, nausea,  diaphoresis, or swelling in her legs.  She denies cough and any other upper  respiratory infection symptoms.  She has had fatigue over the past week or  two.  She was evaluated by Dr. Hardie Pulley PA, [facility], last week who  apparently performed a rectal exam.  Per the patient's account, the rectal  exam was negative.  The patient says that blood work was done, and she was  found to be anemic.  Misty increased her iron pills from once daily to twice  daily.  The patient denies black, tarry stools.  She denies bright red blood  per rectum.  The patient has had no recent or remote history of hematemesis,  hemoptysis, or hematuria.   During the evaluation in the emergency department, the patient is noted to  be hemodynamically stable.  Her EKG reveals a normal sinus rhythm with a  heart rate of 70 beats per minute and no acute ST/T wave abnormalities.  Her  chest x-ray reveals linear atelectasis or scarring at the left base and mild  stable cardiomegaly.  Her cardiac markers are essentially negative.  The BNP  is mildly elevated at 510.  The patient will be admitted for further  evaluation and management.   PAST MEDICAL HISTORY:  1.  Type 2 diabetes mellitus.  2.  Hypertension.  3.  Tobacco abuse.  4.  Degenerative joint disease.  5.  Chronic bilateral lower extremity edema.  6.  Chronic anemia.  The patient says that she has had a colonoscopy and an      upper endoscopy over the past several years performed by Dr. Arlyce Dice.      Per her account, the latest colonoscopy  a year or two ago was negative.  7.  Status post vaginal hysterectomy in 1976 for a prolapsed uterus.  8.  Status post bilateral breast implants in 1976.   MEDICATIONS:  1.  Metformin 850 mg t.i.d.  2.  Actos 45 mg daily.  3.  Norvasc 10 mg daily.  4.  Diovan/HCT, question dose, 2 tablets daily.  5.  Metoprolol 50 mg 1/2 tablet b.i.d.   ALLERGIES:  Patient has an allergy to CODEINE.   SOCIAL HISTORY:  The patient is divorced.  [place]  She has two children.  She is retired from [employer].  She  currently smokes a half pack of cigarettes a day, decreased from a pack a  day.  She has been smoking for approximately 52 years.  She denies alcohol  and illicit drug use.  She still drives.   FAMILY HISTORY:  Her father died of stomach cancer at 18 years of age.  Her  mother died of heart failure at 34 years of age.   REVIEW OF SYSTEMS:  The patient's review of systems is positive, as above in  the history of present illness.  In addition, she does have arthritic pain  in her knees and  her lower back.  Otherwise, review of systems is negative.   PHYSICAL EXAMINATION:  VITAL SIGNS:  Temperature 98.4, blood pressure  147/53, pulse 69, respiratory rate 20, oxygen saturation 94% on room air.  GENERAL:  The patient is a pleasant 77 year old lady who is currently  sitting up in bed in no acute distress.  She denies shortness of breath and  chest pain at this time.  HEENT:  Head is normocephalic and atraumatic.  Pupils are equal, round and  reactive to light.  Extraocular movements are intact.  Conjunctivae are  clear.  Sclerae are white.  Tympanic membranes are clear bilaterally.  Nasal  mucosa is mildly dry.  No sinus tenderness.  Oropharynx reveals a full set  of dentures.  Mucous membranes are moist.  No posterior exudates or  erythema.  NECK:  Supple.  No adenopathy.  No thyromegaly.  No bruits.  No JVD.  LUNGS:  Clear to auscultation bilaterally with exception of decreased breath  sounds in the bases.  HEART:  S1 and S2 with a soft systolic murmur.  ABDOMEN:  Positive bowel sounds.  Soft, nontender, nondistended.  No  hepatosplenomegaly.  No masses palpated.  RECTAL:  The patient's rectal tone is excellent.  Stool is light brown and  trace guaiac positive.  No rectal masses palpated.  EXTREMITIES:  Pedal pulse on the right, 2+.  Pedal pulse on the left, 1+.  No pretibial edema.  No pedal edema.  She has mild arthritic changes in her  knees and her hands bilaterally.  No acute joint findings.  NEUROLOGIC:  The patient is alert and oriented x3.  Cranial nerves II-XII  are intact.   ADMISSION LABORATORIES:  Chest x-ray reveals linear atelectasis or scarring  at the left base.   EKG reveals a normal sinus rhythm with a heart rate of 70 beats per minute,  left atrial enlargement, and no acute ST/T wave abnormalities.   Cardiac markers are negative x3.  Sodium 133, potassium 4, chloride 103, CO2 22, glucose 265, BUN 14, creatinine 0.8, total bilirubin 0.5, alkaline   phosphatase 62, SGOT 26, SGPT 31, total protein 5.7, albumin 3, calcium 9.  WBC 11.4, hemoglobin 8.4, hematocrit 35.8, platelets 385.  BNP 510.  Alcohol  level less than 5.   ASSESSMENT:  1.  Chest pain with associated shortness of breath:  The etiology is unclear      at this time.  The patient's chest x-ray is nonacute.  Her EKG is      nonacute.  Her initial cardiac markers are all negative.  The pain      appears to be atypical for cardiac pain, given that it only occurred      when she was supine.  She does have risk factors for coronary artery      disease, including hypertension, tobacco use, and type 2 diabetes      mellitus.  Her pain has resolved without any intervention.  Included in      the differential diagnoses is pulmonary embolism.  Also consider      gastroesophageal reflux,  chronic obstructive pulmonary disease, and      underlying cardiac strain and dyspnea secondary to anemia.  Of note she      has seen cardiologist,  Dr. Andee Lineman,  in the past for what she believes      was a stress test. It was negative per her recollection.   1.  Normocytic anemia with guaiac positive stool:  The patient's hemoglobin      today is 8.4.  She says that she is chronically anemic and is treated      with iron tablets; however, on her medication list, iron was not listed.      Per her account, she did undergo a recent colonoscopy by      gastroenterologist, Dr. Arlyce Dice.  The results were negative, per her      account.   1.  Type 2 diabetes mellitus:  The patient is chronically treated with      Metformin and Actos.  Her venous glucose is moderately elevated at 265.   1.  Hypertension:  The patient is chronically treated with Norvasc,      Diovan/HCT, and metoprolol.  The patient is unaware of the dose of      Diovan/HCT.  Her blood pressure is only mildly elevated currently.   PLAN:  1.  The patient will be admitted for further evaluation and management.  2.  For further  evaluation, we will check cardiac enzymes q.8h. x3, TSH,      fasting lipid panel, homocysteine level, and a hemoglobin A1C.  3.  Will order a CT scan of the chest to rule out PE.  Will also assess the      patient further with a 2D echocardiogram to evaluate LV and RV function.  4.  The patient has been typed and crossed 2 units of packed red blood      cells, and the units are being transfused now under the guidance of Dr.      Estell Harpin.  Her hemoglobin and hematocrit will be followed closely.  5.  Will check iron studies.  Will add a multivitamin with iron and continue      iron therapy.  Will also add prophylactic Protonix daily.  6.  Will hold Lovenox for now, but will start aspirin therapy.  7.  Will treat the patient's pain with as-needed oxycodone and Tylenol.      Will also add sublingual nitroglycerin for chest pain if  needed.  8.  Tobacco cessation counseling.  9.  Oxygen therapy. 10. Will hold Metformin and treat the patient's diabetes with a sliding-      scale insulin  regimen and Actos.  The Metformin will be held in      anticipation of the CT scan of the chest with contrast.  11. Consider consultations with gastroenterologist, Dr. Arlyce Dice, and      cardiologist, Dr. Andee Lineman.      Elliot Cousin, M.D.  Electronically Signed     DF/MEDQ  D:  06/25/2005  T:  06/25/2005  Job:  [id number]   cc:   Lovenia Kim, D.O.  Fax: [phone]   Learta Codding, M.D. [facility]  [address]  [address]  [place]  [place] [address]   Barbette Hair. Arlyce Dice, M.D. [facility]  [address]  [place]  [place] [address]

## 2010-06-28 NOTE — Op Note (Signed)
NAME:  Michelle Bowers, Michelle Bowers             ACCOUNT NO.:  [id number]   MEDICAL RECORD NO.:  [id number]          PATIENT TYPE:  INP   LOCATION:  4706                         FACILITY:  [facility]   PHYSICIAN:  Danice Goltz, M.D. LHCDATE OF BIRTH:  09/09/1930   DATE OF PROCEDURE:  06/27/2005  DATE OF DISCHARGE:                                 OPERATIVE REPORT   Michelle Bowers is a 77 year old white female admitted on May 16 due to chest  pain and dyspnea.  She was found to have pulmonary edema presumed secondary  to diastolic dysfunction.  A 2D is pending.  She did undergo CT scan of the  chest for evaluation of her chest pain to rule out PE.  This was negative  for PE, however, she did have an enlarged subcarinal node versus mass which  was noted on the CT.  Because of the patient's history of smoking, it was  deemed prudent for the patient to undergo bronchoscopy for evaluation of  this area.   For the details of her physical examination, please refer to the  consultation note hand written on the chart on Jun 26, 2005.   The patient had the procedure explained to her with benefits, limitations,  and potential complications of the same and she agreed to proceed.   The patient was taken to the endoscopy suite where she was placed under  conscious sedation with Versed 5 mg IV and fentanyl 5 mcg IV after consent  had been obtained.  She had the posterior pharynx anesthetized with  Cetacaine.  An oral bite block was used as the oral route was used for  introduction of the bronchoscope.   The bronchoscope was advanced via the oral route.  The vocal cords were  noted to be normal.  The trachea was normal.  The carina was relatively  sharp. Inspection of the right and left tracheobronchial trees showed no  endobronchial lesions, however, the patient did have some mild chronic  bronchitic changes..  At this point, the bronchoscope was brought back to  the carina area.  There was a mild protrusion on the  left which could  indicate some subcarinal tissue in this area.  I elected to have a Wang  needle aspirate made.  A #20 gauge Exelon Wang needle was used for sampling.  Two passes were made with good tissue obtained.  After inspection of the  area, it was noted the patient had negligible blood loss and good  hemostasis. At this point, the bronchoscope was retracted and the procedure  was terminated.  The patient was taken to the endoscopy recovery area  without any overt complications noted.  Of note, is that her vital signs  were monitored throughout the entire procedure and the patient had no  desaturations and blood pressure and heart rate remained stable throughout.   IMPRESSION:  1.  Subcarinal adenopathy of undetermined etiology.   PLAN:  Await pathology.  If the pathology is negative, the patient will  require follow-ups periodically of this abnormality.  This can be done as an  outpatient.  The findings of all the above  were made known to the patient's  daughter.           ______________________________  Danice Goltz, M.D. [facility]     LG/MEDQ  D:  06/27/2005  T:  06/27/2005  Job:  [id number]   cc:   Lovenia Kim, D.O.  Fax: [phone]

## 2010-06-28 NOTE — Discharge Summary (Signed)
Michelle Bowers, Michelle Bowers             ACCOUNT NO.:  [id number]   MEDICAL RECORD NO.:  [id number]          PATIENT TYPE:  INP   LOCATION:  4706                         FACILITY:  [facility]   PHYSICIAN:  Hillery Aldo, M.D.   DATE OF BIRTH:  04-03-1930   DATE OF ADMISSION:  06/25/2005  DATE OF DISCHARGE:  06/28/2005                                 DISCHARGE SUMMARY   DISCHARGE DIAGNOSES:  1.  Acute pulmonary edema thought to be secondary to a combination of      treatment with Actos and volume overload from blood transfusions  2.  Subcarinal mass status post biopsy, pathology pending.  3.  Diabetes mellitus.  4.  Hypokalemia.  5.  Hypertension.  6.  Anemia with low iron stores and heme-positive stool status post iron,      B12 and folate studies.  7.  Ongoing tobacco abuse.  8.  Degenerative joint disease.   DISCHARGE MEDICATIONS:  1.  Aspirin 81 mg daily.  2.  Metformin 850 mg t.i.d.  3.  Norvasc 10 mg daily.  4.  Diovan/HCTZ 1-1/2 tablets daily.  5.  Metoprolol 25 mg b.i.d.  6.  Chantix as before.  7.  Iron sulfate 325 mg b.i.d.  8.  Spiriva 1 inhalation daily.  9.  Albuterol meter dose inhaler 2 puffs q.4h. p.r.n.  10. Lasix 40 mg daily.  11. Potassium chloride 20 mEq daily.   Note, the patient was instructed to discontinue Actos and to start her  Januvia which she had not yet done.   CONSULTATIONS:  Dr. Danice Goltz of pulmonary medicine.   BRIEF ADMISSION HPI:  The patient is a 77 year old female with past medical  history of chronic anemia who presented to the hospital with complaints of  chest pain, dyspnea and paroxysmal nocturnal dyspnea.  Upon initial  evaluation, she was found to be anemic and had an elevated BNP.  She  subsequently underwent a CT angiogram of the chest which revealed a  subcarinal mass.  She was admitted for further evaluation and workup.   PROCEDURES AND DIAGNOSTIC STUDIES:  1.  Chest x-ray on 5, 16, 2007 showed linear atelectasis or scarring  at the      left base.  There was stable mild cardiomegaly noted.  2.  CT angiogram of the chest on Jun 25, 2005 showed no evidence of      pulmonary embolus.  There were multiple borderline enlarged paratracheal      nodes and an enlarged subcarinal nodal mass which was concerning for      metastatic disease.  There was a small right pleural effusion and      bilateral lower lobe atelectasis.  There was intralobar septal      thickening suggestive of edema.  Focal ground glass attenuation anterior      right upper lobe was thought to reflect atelectasis.  There was mild      cardiomegaly and mitral valve annular calcifications.  There were      bilateral adrenal masses for incompletely characterized and the      radiologist recommended a CT scan with  and without contrast versus an      MRI for further evaluation if clinically indicated.  3.  A 2-D echocardiography on Jun 27, 2005 showed overall left ventricular      systolic function was vigorous.  There were no left regional wall motion      abnormalities of the ventricle.  There was mild fibrocalcific change of      the aortic root.  There was moderate mitral annular calcification.      There was mild mitral valvular regurgitation.  Left atrial size was at      the upper limits of normal.  There was moderate right ventricular      hypertrophy.  The inferior vena caval was mildly dilated.  4.  Bronchoscopy with biopsy of subcarinal mass done on Jun 27, 2005:      Pathology is pending at the time of this dictation.   DISCHARGE LABORATORY VALUES:  CBC showed a white blood cell count of 8.3,  hemoglobin 10.5, hematocrit 32, platelet count 351.  Sodium was 138,  potassium 3.8, chloride 106, bicarb 25, BUN 19, creatinine 0.9, glucose 217.  Hemoglobin A1c was 6.2, TSH 2.048, iron 27, TIBC 334, percent saturation 8,  B12 741, RBC folate 1226, and ferritin 10.  Fecal occult blood testing was  positive.   HOSPITAL COURSE:  Problem 1:  ACUTE  PULMONARY EDEMA:  The patient's clinical  appearance and elevated BNP was suggestive of pulmonary edema.  She also had  a mild elevation of her troponins with normal CK and CK-MB values.  Given  this, her dyspnea was thought to be multifactorial and secondary to anemia,  COPD and mild congestive heart failure thought to be secondary to treatment  with Actos and then complicated by volume overload secondary to 2 units of  packed red blood cells.  She was put on 40 mg of Lasix and a 2-D  echocardiogram was done which did not show any evidence of systolic or  diastolic heart failure.  Her Actos was discontinued and she continued to  diurese with Lasix.  To address her underlying COPD, she was put on Spiriva  and albuterol.  Recommendations were made for outpatient pulmonary function  tests and she is scheduled to follow up with Dr. Jayme Cloud of pulmonary  medicine in one to two weeks' time.  At this point, she is at her baseline  and again has been encouraged to discontinue tobacco smoking.   Problem 2:  SUBCARINAL MASS:  The patient was noted to have a mass in the  subcarinal area on CT scanning of the chest.  Given this, pulmonary  consultation was obtained for bronchoscopy.  The patient underwent  bronchoscopy on Jun 27, 2005.  Biopsies were taken and pathology is pending  at this time.  Incidentally noted was some element of chronic bronchitis on  the bronchoscopy.   Problem 3:  DIABETES:  The patient's metformin was held secondary to  anticipation of contrast dye been given for the CT angiogram of the chest.  Additionally, because of her pulmonary edema, her Actos was discontinued.  While here, she was maintained on a combination of Lantus and sliding scale  insulin.  We will discharge her on metformin and have her continue to hold  her Actos.  The patient states that her primary care physician did start her  on Januvia but she had not started medication.  She was instructed to  start this upon return home and to keep a log  of her blood glucoses so she can  view them with her primary care physician and adjustments can be made to her  hypoglycemic regimen if indicated.  Her hemoglobin A1c was 6.2 revealing  that she has had good control over the past three months.   Problem 4:  HYPERTENSION:  The patient's hypertension was well controlled on  her usual home medicines.   Problem 5:  HYPOKALEMIA:  The patient became slightly hypokalemic with the  addition of Lasix.  She was appropriately repleted and will be discharged on  supplementation.   Problem 6:  ANEMIA:  The patient's admission hemoglobin was 8.4.  She had  recently been seen by her primary care physician and at that time was found  to have heme negative stool.  Her stool was found to be heme positive here.  The patient reports that she has had a GI workup within the last year that  has included an upper and lower endoscopy.  She reports a problem with  anemia chronically.     Given her symptoms, the patient was transfused 2 units packed red blood  cells with an appropriate rise in her hemoglobin.  Iron studies, B12 and  folate studies were checked.  Findings are as noted above.  She was put on  iron supplementation for her low iron stores but she also has a component of  anemia of chronic disease.  Given her recent outpatient GI workup, no  further GI workup was undertaken.  She should follow up with Dr. Arlyce Dice for  any ongoing GI evaluation as an outpatient.   Problem 7:  TOBACCO ABUSE:  The patient was counseled on the importance of  cessation.  Additionally, she states that she has been on Chantix and was  encouraged to continue this at discharge.   DISPOSITION:  The patient will be discharged home.  She is to follow up with  Dr. Jayme Cloud in 1-2 weeks, Dr. Elisabeth Most 1-2 weeks, and Dr. Arlyce Dice on an as-  needed basis.           ______________________________  Hillery Aldo, M.D.     CR/MEDQ   D:  06/28/2005  T:  06/28/2005  Job:  [id number]   cc:   Barbette Hair. Arlyce Dice, M.D. [facility]  [address]  [place]  [place] [address]   Danice Goltz, M.D. [facility]  [address] [place], [place] [address]   Lovenia Kim, D.O.  Fax: [phone]

## 2010-06-28 NOTE — H&P (Signed)
NAME:  Michelle Bowers, Michelle Bowers                       ACCOUNT NO.:  [id number]   MEDICAL RECORD NO.:  [id number]                   PATIENT TYPE:  EMS   LOCATION:  ED                                   FACILITY:  [facility]   PHYSICIAN:  Gracelyn Nurse, M.D.              DATE OF BIRTH:  [birth year]   DATE OF ADMISSION:  10/24/2001  DATE OF DISCHARGE:                                HISTORY & PHYSICAL   CHIEF COMPLAINT:  Weakness.   HISTORY OF PRESENT ILLNESS:  This is a 77 year old white female who comes in  complaining of weakness, palpitations x2 weeks; also shortness of breath on  exertion.  Today she started feeling like she has numbness in her fingertips  on both hands.  She has not noted any dark, tarry stools or any hematemesis.  She started taking Wellbutrin to stop smoking about two weeks ago.   PAST MEDICAL HISTORY:  1. Non-insulin-dependent diabetes.  2. Lower extremity edema.  3. Hypertension.  4. Partial hysterectomy.  5. Degenerative disks.   ALLERGIES:  CODEINE.   CURRENT MEDICATIONS:  1. Wellbutrin 150 mg q.d.  2. Glucophage 850 mg t.i.d.  3. Lotensin 40 mg b.i.d.  4. Actos 30 mg q.d.  5. Amaryl 4 mg q.d.  6. Norvasc 10 mg q.d.  7. Lasix 20 mg q.d.   SOCIAL HISTORY:  She smokes one pack of cigarettes a day, does not drink  alcohol.  She is divorced, has two children.   FAMILY HISTORY:  Mother died of an MI at age 57.  Father died at age 19 of  gastric cancer.   REVIEW OF SYSTEMS:  As per HPI.  Also, chronic cough, joint pain.  All other  systems are reviewed and are normal.   VITAL SIGNS:  Temperature 97.3, pulse 61, respirations 22, blood pressure  168/62.   PHYSICAL EXAMINATION:  GENERAL:  This is a well-nourished white female who  appears pale.  HEENT:  Pupils equal, round, and reactive to light.  Poor pallor.  Oral  mucosa is moist.  Oropharynx is clear.  CARDIOVASCULAR:  Regular rate and rhythm.  There is a 2/6 systolic murmur.  LUNGS:  Clear to  auscultation.  ABDOMEN:  Soft, nontender, nondistended, bowel sounds positive.  EXTREMITIES:  No edema.  NEUROLOGIC:  Cranial nerves II-XII grossly intact.  No focal deficit.  SKIN:  There is poor turgor, no rashes.   ADMISSION LABORATORY DATA:  White blood cells 14, hemoglobin 5.8, platelets  437.  Sodium 130, potassium 3.7, chloride 97, CO2 26, BUN 17, creatinine  0.8, glucose 212.   ASSESSMENT AND PLAN:  1. Weakness.  This is secondary to her severe anemia as these symptoms can     be caused by this.  She was guaiac negative on exam but still suspect     this could be a GI source of bleeding.  2. Severe anemia.  As stated above, suspect  GI bleeding as this is the most     common.  Would like to admit her to hospital for further workup.  The     patient desired to go home and follow up with her doctor.  I did talk     with the patient about being admitted in [place].  At that time I     spoke to her doctor, Dr. Elisabeth Most, who wanted to transfuse her and see     her in her office tomorrow.  She spoke to the patient by phone who agreed     with that, so we are going to transfuse her 2 units of packed red blood     cells and have her follow up with Dr. Elisabeth Most tomorrow morning in her     office.                                                 Gracelyn Nurse, M.D.    JDJ/MEDQ  D:  10/24/2001  T:  10/25/2001  Job:  [id number]   cc:   Lovenia Kim, D.O.   Barbette Hair. Arlyce Dice, M.D. [facility]

## 2010-07-26 ENCOUNTER — Other Ambulatory Visit: Payer: Self-pay | Admitting: Endocrinology

## 2010-07-26 ENCOUNTER — Encounter (HOSPITAL_COMMUNITY): Payer: Medicare Other | Attending: Endocrinology

## 2010-07-26 DIAGNOSIS — D649 Anemia, unspecified: Secondary | ICD-10-CM | POA: Insufficient documentation

## 2010-07-27 LAB — CROSSMATCH: ABO/RH(D): O NEG

## 2010-07-29 LAB — GLUCOSE, CAPILLARY: Glucose-Capillary: 134 mg/dL — ABNORMAL HIGH (ref 70–99)

## 2010-09-03 ENCOUNTER — Emergency Department (HOSPITAL_COMMUNITY): Payer: Medicare Other

## 2010-09-03 ENCOUNTER — Inpatient Hospital Stay (HOSPITAL_COMMUNITY)
Admission: EM | Admit: 2010-09-03 | Discharge: 2010-09-09 | DRG: 378 | Disposition: A | Discharge status: Skilled Nursing Facility | Payer: Medicare Other | Attending: Endocrinology | Admitting: Endocrinology

## 2010-09-03 DIAGNOSIS — I509 Heart failure, unspecified: Secondary | ICD-10-CM | POA: Diagnosis present

## 2010-09-03 DIAGNOSIS — N390 Urinary tract infection, site not specified: Secondary | ICD-10-CM | POA: Diagnosis present

## 2010-09-03 DIAGNOSIS — K5289 Other specified noninfective gastroenteritis and colitis: Secondary | ICD-10-CM | POA: Diagnosis present

## 2010-09-03 DIAGNOSIS — I69959 Hemiplegia and hemiparesis following unspecified cerebrovascular disease affecting unspecified side: Secondary | ICD-10-CM

## 2010-09-03 DIAGNOSIS — J449 Chronic obstructive pulmonary disease, unspecified: Secondary | ICD-10-CM | POA: Diagnosis present

## 2010-09-03 DIAGNOSIS — E785 Hyperlipidemia, unspecified: Secondary | ICD-10-CM | POA: Diagnosis present

## 2010-09-03 DIAGNOSIS — E876 Hypokalemia: Secondary | ICD-10-CM | POA: Diagnosis present

## 2010-09-03 DIAGNOSIS — I10 Essential (primary) hypertension: Secondary | ICD-10-CM | POA: Diagnosis present

## 2010-09-03 DIAGNOSIS — Z9981 Dependence on supplemental oxygen: Secondary | ICD-10-CM

## 2010-09-03 DIAGNOSIS — I5032 Chronic diastolic (congestive) heart failure: Secondary | ICD-10-CM | POA: Diagnosis present

## 2010-09-03 DIAGNOSIS — J4489 Other specified chronic obstructive pulmonary disease: Secondary | ICD-10-CM | POA: Diagnosis present

## 2010-09-03 DIAGNOSIS — K5520 Angiodysplasia of colon without hemorrhage: Secondary | ICD-10-CM

## 2010-09-03 DIAGNOSIS — Z794 Long term (current) use of insulin: Secondary | ICD-10-CM

## 2010-09-03 DIAGNOSIS — D500 Iron deficiency anemia secondary to blood loss (chronic): Secondary | ICD-10-CM

## 2010-09-03 DIAGNOSIS — K5521 Angiodysplasia of colon with hemorrhage: Principal | ICD-10-CM | POA: Diagnosis present

## 2010-09-03 DIAGNOSIS — D6489 Other specified anemias: Secondary | ICD-10-CM | POA: Diagnosis present

## 2010-09-03 DIAGNOSIS — M1990 Unspecified osteoarthritis, unspecified site: Secondary | ICD-10-CM | POA: Diagnosis present

## 2010-09-03 DIAGNOSIS — R627 Adult failure to thrive: Secondary | ICD-10-CM | POA: Diagnosis present

## 2010-09-03 DIAGNOSIS — E119 Type 2 diabetes mellitus without complications: Secondary | ICD-10-CM | POA: Diagnosis present

## 2010-09-03 LAB — DIFFERENTIAL
Basophils Relative: 1 % (ref 0–1)
Eosinophils Absolute: 0.1 10*3/uL (ref 0.0–0.7)
Eosinophils Relative: 1 % (ref 0–5)
Monocytes Absolute: 2.1 10*3/uL — ABNORMAL HIGH (ref 0.1–1.0)
Monocytes Relative: 15 % — ABNORMAL HIGH (ref 3–12)

## 2010-09-03 LAB — CBC
MCH: 23.4 pg — ABNORMAL LOW (ref 26.0–34.0)
MCHC: 29.3 g/dL — ABNORMAL LOW (ref 30.0–36.0)
Platelets: 412 10*3/uL — ABNORMAL HIGH (ref 150–400)
RDW: 16.5 % — ABNORMAL HIGH (ref 11.5–15.5)

## 2010-09-03 LAB — BASIC METABOLIC PANEL
BUN: 23 mg/dL (ref 6–23)
CO2: 24 mEq/L (ref 19–32)
Creatinine, Ser: 1.1 mg/dL (ref 0.50–1.10)
GFR calc Af Amer: 58 mL/min — ABNORMAL LOW (ref >60)
Sodium: 134 mEq/L — ABNORMAL LOW (ref 135–145)

## 2010-09-03 LAB — URINALYSIS, ROUTINE W REFLEX MICROSCOPIC
Bilirubin Urine: NEGATIVE
Hgb urine dipstick: NEGATIVE
Protein, ur: NEGATIVE mg/dL
Urobilinogen, UA: 0.2 mg/dL (ref 0.0–1.0)

## 2010-09-03 LAB — TROPONIN I: Troponin I: <0.3 ng/mL (ref <0.30)

## 2010-09-03 LAB — CK TOTAL AND CKMB (NOT AT ARMC): Relative Index: INVALID (ref 0.0–2.5)

## 2010-09-03 LAB — PRO B NATRIURETIC PEPTIDE: Pro B Natriuretic peptide (BNP): 4127 pg/mL — ABNORMAL HIGH (ref 0–450)

## 2010-09-04 DIAGNOSIS — K5520 Angiodysplasia of colon without hemorrhage: Secondary | ICD-10-CM

## 2010-09-04 DIAGNOSIS — D500 Iron deficiency anemia secondary to blood loss (chronic): Secondary | ICD-10-CM

## 2010-09-04 LAB — GLUCOSE, CAPILLARY
Glucose-Capillary: 171 mg/dL — ABNORMAL HIGH (ref 70–99)
Glucose-Capillary: 217 mg/dL — ABNORMAL HIGH (ref 70–99)

## 2010-09-04 LAB — COMPREHENSIVE METABOLIC PANEL
AST: 11 U/L (ref 0–37)
BUN: 21 mg/dL (ref 6–23)
CO2: 26 mEq/L (ref 19–32)
Calcium: 9 mg/dL (ref 8.4–10.5)
Chloride: 97 mEq/L (ref 96–112)
Creatinine, Ser: 0.88 mg/dL (ref 0.50–1.10)
GFR calc Af Amer: >60 mL/min (ref >60)
GFR calc non Af Amer: >60 mL/min (ref >60)
Glucose, Bld: 192 mg/dL — ABNORMAL HIGH (ref 70–99)
Total Bilirubin: 0.6 mg/dL (ref 0.3–1.2)

## 2010-09-04 LAB — CBC
HCT: 29.3 % — ABNORMAL LOW (ref 36.0–46.0)
Hemoglobin: 9.5 g/dL — ABNORMAL LOW (ref 12.0–15.0)
MCH: 26.5 pg (ref 26.0–34.0)
MCV: 81.6 fL (ref 78.0–100.0)
Platelets: 323 10*3/uL (ref 150–400)
RBC: 3.59 MIL/uL — ABNORMAL LOW (ref 3.87–5.11)
WBC: 11.5 10*3/uL — ABNORMAL HIGH (ref 4.0–10.5)

## 2010-09-05 DIAGNOSIS — K5520 Angiodysplasia of colon without hemorrhage: Secondary | ICD-10-CM

## 2010-09-05 DIAGNOSIS — D500 Iron deficiency anemia secondary to blood loss (chronic): Secondary | ICD-10-CM

## 2010-09-05 LAB — CBC
Hemoglobin: 9.1 g/dL — ABNORMAL LOW (ref 12.0–15.0)
MCV: 82.4 fL (ref 78.0–100.0)
Platelets: 318 10*3/uL (ref 150–400)
RBC: 3.47 MIL/uL — ABNORMAL LOW (ref 3.87–5.11)
WBC: 9.8 10*3/uL (ref 4.0–10.5)

## 2010-09-05 LAB — COMPREHENSIVE METABOLIC PANEL
ALT: 8 U/L (ref 0–35)
AST: 11 U/L (ref 0–37)
CO2: 26 mEq/L (ref 19–32)
Chloride: 101 mEq/L (ref 96–112)
GFR calc non Af Amer: >60 mL/min (ref >60)
Glucose, Bld: 153 mg/dL — ABNORMAL HIGH (ref 70–99)
Sodium: 136 mEq/L (ref 135–145)
Total Bilirubin: 0.4 mg/dL (ref 0.3–1.2)

## 2010-09-06 ENCOUNTER — Inpatient Hospital Stay (HOSPITAL_COMMUNITY): Payer: Medicare Other

## 2010-09-06 ENCOUNTER — Other Ambulatory Visit: Payer: Self-pay | Admitting: Gastroenterology

## 2010-09-06 DIAGNOSIS — D509 Iron deficiency anemia, unspecified: Secondary | ICD-10-CM

## 2010-09-06 DIAGNOSIS — K31819 Angiodysplasia of stomach and duodenum without bleeding: Secondary | ICD-10-CM

## 2010-09-06 LAB — BASIC METABOLIC PANEL
BUN: 14 mg/dL (ref 6–23)
CO2: 25 mEq/L (ref 19–32)
Calcium: 9 mg/dL (ref 8.4–10.5)
GFR calc non Af Amer: >60 mL/min (ref >60)
Glucose, Bld: 176 mg/dL — ABNORMAL HIGH (ref 70–99)

## 2010-09-06 LAB — CBC
HCT: 28.2 % — ABNORMAL LOW (ref 36.0–46.0)
Hemoglobin: 8.9 g/dL — ABNORMAL LOW (ref 12.0–15.0)
MCH: 26.3 pg (ref 26.0–34.0)
MCV: 83.4 fL (ref 78.0–100.0)
RBC: 3.38 MIL/uL — ABNORMAL LOW (ref 3.87–5.11)

## 2010-09-07 LAB — CROSSMATCH
Antibody Screen: NEGATIVE
Unit division: 0
Unit division: 0
Unit division: 0

## 2010-09-07 LAB — GLUCOSE, CAPILLARY
Glucose-Capillary: 198 mg/dL — ABNORMAL HIGH (ref 70–99)
Glucose-Capillary: 214 mg/dL — ABNORMAL HIGH (ref 70–99)
Glucose-Capillary: 186 mg/dL — ABNORMAL HIGH (ref 70–99)
Glucose-Capillary: 177 mg/dL — ABNORMAL HIGH (ref 70–99)

## 2010-09-07 LAB — BASIC METABOLIC PANEL
BUN: 10 mg/dL (ref 6–23)
Chloride: 103 mEq/L (ref 96–112)
GFR calc Af Amer: >60 mL/min (ref >60)
GFR calc non Af Amer: >60 mL/min (ref >60)
Glucose, Bld: 193 mg/dL — ABNORMAL HIGH (ref 70–99)
Potassium: 3.2 mEq/L — ABNORMAL LOW (ref 3.5–5.1)
Sodium: 137 mEq/L (ref 135–145)

## 2010-09-07 LAB — CBC
HCT: 29.1 % — ABNORMAL LOW (ref 36.0–46.0)
Hemoglobin: 9 g/dL — ABNORMAL LOW (ref 12.0–15.0)
MCHC: 30.9 g/dL (ref 30.0–36.0)
RDW: 15.7 % — ABNORMAL HIGH (ref 11.5–15.5)
WBC: 9.4 10*3/uL (ref 4.0–10.5)

## 2010-09-07 NOTE — H&P (Signed)
Michelle Bowers, Michelle Bowers             ACCOUNT NO.:  [id number]  MEDICAL RECORD NO.:  [id number]  LOCATION:  1235                         FACILITY:  [facility]  PHYSICIAN:  Gwen Pounds, MD       DATE OF BIRTH:  [birth year]  DATE OF ADMISSION:  09/03/2010 DATE OF DISCHARGE:                             HISTORY & PHYSICAL   PRIMARY CARE PHYSICIAN:  Tera Mater. Evlyn Kanner, M.D.  CHIEF COMPLAINT:  GI bleed.  HISTORY OF PRESENT ILLNESS:  [name] who has been a resident at [facility] for the last 2 years with history of cecal AVM and a history of prior GI bleeds, last sigmoidoscopy that I found in the computer was August 10, 2009, which was otherwise negative and she has had some recent 2-unit blood transfusion in June and some blood transfusion in April.  The patient reports no blood in the stools, but she was changed by the staff at [facility].  She has had no heartburn, no indigestion, and she denies any melena.  Although everything burns her throat, but that has been constant and chronic since her CVA.  She was transferred to the emergency department today with weakness, failure-to- thrive, low-grade temperature, pale, decreased sats.  In the ED, workup was positive for a hemoglobin of 4+.  She is surprisingly hemodynamically stable and will need admission to the ICU step-down area for transfusion and evaluation and treatment.  Her last outpatient hemoglobin was 7.4 up from 6.6 on August 19, 2010 and this was after given the blood in June.  She has had recent antibiotics given of Keflex and unclear as to what infection she was being treated for and she has also been on Lialda for quite awhile.  She is also on aspirin and Plavix on her medication list and these will be discontinued at this current time.  PAST MEDICAL HISTORY: 1. History of respiratory failure and history of prior chronic oxygen     use. 2. History of right hemiparesis. 3. Stroke in July 2010. 4. Diabetes mellitus type  2. 5. Hypertension. 6. History of COPD. 7. Hypokalemia. 8. History of CHF and diastolic dysfunction. 9. Known cecal AVM 10.Benign left adrenal mass. 11.Hypertension. 12.Chronic anemia. 13.Lower extremity edema. 14.Degenerative joint disease. 15.Breast implants. 16.History of dysphagia. 17.History of gallstones with a negative HIDA.  CODE STATUS:  Full code.  ALLERGIES:  CODEINE.  MEDICATIONS: 1. Norvasc 10 mg p.o. daily. 2. Aspirin 81 daily. 3. Plavix 75 daily. 4. Cymbalta 30 daily. 5. Fish oil 1000 one capsule by mouth daily. 6. Florastor 250 daily. 7. Lasix 40 daily. 8. Amaryl 4 mg p.o. daily. 9. Prevacid 30 daily. 10.Vitamin D 1000 by mouth daily. 11.Nu-Iron 150 daily. 12.Apresoline 100 mg t.i.d. 13.Lialda 1.2 g 2 tablets by mouth once daily. 14.Health shake by mouth twice a day to increase calorie. 15.Humalog sliding scale. 16.Lantus 8 units subcu q.12 h. 17.Status post Keflex.  SOCIAL HISTORY:  Resides at [place].  No tobacco at this current time, status post 55-year tobacco smoking history.  No alcohol.FAMILY HISTORY:  Stomach cancer and congestive heart failure.  REVIEW OF SYSTEMS:  Please see HPI.  She has a right hemiparesis.  She has got no  nausea, no vomiting, no diarrhea.  All other symptoms have been asked and they are negative.  PHYSICAL EXAMINATION:  VITAL SIGNS:  Temperature 97.8 to 99.5, blood pressure 134/43, heart rate is 81, respiratory rate is 18, satting 97% on room air. GENERAL:  Alert and oriented, is pale.  Oropharynx is dry. NECK:  No JVD. PULMONARY:  Clear to auscultation with mild tachypnea noted. CARDIAC:  Regular. ABDOMEN:  Soft, nontender, nondistended.  Bowel sounds positive. RECTAL:  Reported heme-positive. EXTREMITIES:  Right hemiparesis.  No edema.  ANCILLARY DATA:  White count 13.6, hemoglobin 4.8, platelet count 412. Urinalysis, negative.  Chest x-ray, mild congestive heart failure. Sodium 134, potassium 3.8, chloride  98, bicarb 24, BUN 23, creatinine 1.1, glucose 127, CK and troponin I are negative.  ASSESSMENT:  This is an elderly female with multiple medical problems being admitted with symptomatic anemia, gastrointestinal bleed, significantly low hemoglobin, and surprisingly hemodynamically stable.  PLAN: 1. Admit to ICU step-down. 2. Discontinue the aspirin and discontinue the Plavix at this current     time. 3. GI has been already consulted and we will consider a re-scope of     upper and lower.  For the history of cecal AVMs, I already had a     discussion about potential hemicolectomy and possible red blood     cell scan and possible angiogram, but since she is hemodynamically     stable, I do not think this is necessary at this current time. 4. We will continue supportive care. 5. Transfuse aggressively and watch for congestive heart failure. 6. Control diabetes. 7. Watch vitals. 8. Continue Lialda and get GI to comment whether this is necessary. 9. Home medications where they are appropriate. 10.Squeezers for DVT prophylaxis. 11.Follow labs. 12.Oxygen has been ordered. 13.Watch temperature curve if her current chest x-ray and urinalysis     are negative and she does not seem like she is overtly infected or     septic at this current time.     Gwen Pounds, MD     JMR/MEDQ  D:  09/03/2010  T:  09/04/2010  Job:  [id number]  cc:   Tera Mater. Evlyn Kanner, M.D. Fax: [phone]  Electronically Signed by Creola Corn MD on 09/07/2010 08:47:03 PM

## 2010-09-08 ENCOUNTER — Inpatient Hospital Stay (HOSPITAL_COMMUNITY): Payer: Medicare Other

## 2010-09-08 LAB — CBC
HCT: 30.1 % — ABNORMAL LOW (ref 36.0–46.0)
Hemoglobin: 9.4 g/dL — ABNORMAL LOW (ref 12.0–15.0)
MCH: 26 pg (ref 26.0–34.0)
MCV: 83.4 fL (ref 78.0–100.0)
Platelets: 345 10*3/uL (ref 150–400)
RBC: 3.61 MIL/uL — ABNORMAL LOW (ref 3.87–5.11)
WBC: 7.4 10*3/uL (ref 4.0–10.5)

## 2010-09-08 LAB — GLUCOSE, CAPILLARY
Glucose-Capillary: 183 mg/dL — ABNORMAL HIGH (ref 70–99)
Glucose-Capillary: 135 mg/dL — ABNORMAL HIGH (ref 70–99)

## 2010-09-08 LAB — BASIC METABOLIC PANEL
CO2: 25 mEq/L (ref 19–32)
Chloride: 105 mEq/L (ref 96–112)
Glucose, Bld: 199 mg/dL — ABNORMAL HIGH (ref 70–99)
Sodium: 138 mEq/L (ref 135–145)

## 2010-09-09 LAB — URINALYSIS, ROUTINE W REFLEX MICROSCOPIC
Bilirubin Urine: NEGATIVE
Ketones, ur: NEGATIVE mg/dL
Nitrite: POSITIVE — AB
Protein, ur: 100 mg/dL — AB
Urobilinogen, UA: 0.2 mg/dL (ref 0.0–1.0)
pH: 7.5 (ref 5.0–8.0)

## 2010-09-09 LAB — CBC
Hemoglobin: 10 g/dL — ABNORMAL LOW (ref 12.0–15.0)
MCH: 25.9 pg — ABNORMAL LOW (ref 26.0–34.0)
Platelets: 374 10*3/uL (ref 150–400)
RBC: 3.86 MIL/uL — ABNORMAL LOW (ref 3.87–5.11)
WBC: 11.4 10*3/uL — ABNORMAL HIGH (ref 4.0–10.5)

## 2010-09-09 LAB — BASIC METABOLIC PANEL
CO2: 26 mEq/L (ref 19–32)
Calcium: 9.7 mg/dL (ref 8.4–10.5)
Potassium: 3.3 mEq/L — ABNORMAL LOW (ref 3.5–5.1)
Sodium: 139 mEq/L (ref 135–145)

## 2010-09-09 LAB — GLUCOSE, CAPILLARY
Glucose-Capillary: 95 mg/dL (ref 70–99)
Glucose-Capillary: 203 mg/dL — ABNORMAL HIGH (ref 70–99)

## 2010-09-09 LAB — URINE MICROSCOPIC-ADD ON

## 2010-09-09 NOTE — Consult Note (Signed)
NAME:  Michelle Bowers, Michelle Bowers             ACCOUNT NO.:  [id number]  MEDICAL RECORD NO.:  [id number]  LOCATION:  WLED                         FACILITY:  [facility]  PHYSICIAN:  Michelle Boop, MD,FACGDATE OF BIRTH:  Jul 03, 1930  DATE OF CONSULTATION:  09/03/2010 DATE OF DISCHARGE:                                CONSULTATION   Gastroenterology Consultation  REQUESTING PHYSICIAN:  Michelle Bowers, M.D.  REASON FOR CONSULTATION:  Heme-positive stool and anemia, severe  ASSESSMENT:  [name], who presents with recurrent problems with anemia, she is heme-positive.  She has a hemoglobin of 4 and an MCV of 80.  Her ferritin was 10 in April.  She has had two outpatient transfusions since April.  She is severely symptomatic with dyspnea at rest.  No clear etiology at this time.  Her BUN is normal. She has a history of angiodysplasia of the cecum, ablated in 2005.  She takes Plavix and aspirin.  I am suspicious that she has recurrent bleeding from angiodysplasia.  She has what looks like some petechia on her upper chest and her right cheek and may have some telangiectasia on the undersurface of her tongue.  RECOMMENDATIONS AND PLAN: 1. Transfuse as you are to, need to consider repeat endoscopic workup     and possible therapy.  She is debilitated from a stroke with some     degree, but I think once she has had transfusion and her symptoms     improve (she does have an elevated BMP consistent with CHF     problems, but I think this is related to the severe anemia) would     try to prep her for a colonoscopy and then possible upper GI     endoscopy.  Her Plavix and aspirin will need to be held; however,     at least 5 days on Plavix. 2. Further plans pending clinical course. 3. Note, she had a borderline B12 level at 287, an injection of     vitamin B12, if not done would not be unreasonable, x1.  HISTORY:  This is a 77 year old white woman, widowed, a resident of [facility].  Over the last several day, she developed increasing dyspnea, perhaps a low grade temperature.  She was noted to be pale and her hemoglobin was drawn and was 4.  She was sent to the hospital for admission.  There has been no reports of melena or hematochezia, though she does not look at the stool.  There is no reports of that from the nursing staff.  Hemoglobin was 4.8, MCV is 80, hematocrit 16.  She had a hemoglobin of 9 in April.  Her ferritin was 10 and the B12 was 287.  She has received two transfusions as an outpatient since then.  She is significantly short of breath and weak overall.  She denies any chest pain or pressure.  She denies any esophageal dysphagia. She will occasionally have some borborygmi or gas, but denies any significant abdominal pain.  There is no nausea and vomiting.  REVIEW OF SYSTEMS:  She is chronically debilitated and is unable to walk related to stroke resulting in a right hemiplegia in the right  upper extremity and lower extremity.  She needs assistance to transfer to a wheelchair.  She has lost her appetite a little bit and just generally feels unwell.  All other review of systems negative or as per HPI.  MEDICATIONS:  Patient's medications are: 1. Cephalexin 500 mg t.i.d. 2. Florastor 250 mg daily. 3. Tylenol p.r.n. 4. Prevacid 30 mg daily. 5. Glimepiride 2 mg daily. 6. Furosemide 40 mg daily. 7. Cymbalta 30 mg every morning. 8. Fish oil 1000 mg daily. 9. Plavix 75 mg daily. 10.Aspirin 81 mg daily. 11.Norvasc 10 mg daily. 12.Humalog SQ sliding scale. 13.Lialda 1.2 mg 2 tablets daily. 14.Hydralazine 100 mg three times daily. 15.Nu-Iron. 16.Vitamin D.  ALLERGIES:  Sensitive to CODEINE.  PAST MEDICAL HISTORY: 1. Hypertension. 2. Prior stroke. 3. Diabetes mellitus type 2. 4. Lymphocytic colitis diagnosed in 2011, treated with Lialda,     asymptomatic. 5. Dyslipidemia. 6. Congestive heart failure. 7. Iron deficiency anemia. 8.  Depression. 9. Benign left renal mass. 10.COPD. 11.Breast implants. 12.Gallstones with negative HIDA. 13.Osteoarthritis. 14.Degenerative joint disease. 15.History of respiratory failure.  SOCIAL HISTORY:  She is a resident of [facility].  She is a widow.  She has 2 children, 3 grandchildren, former smoker.  No alcohol.  FAMILY HISTORY:  Gastric cancer, CHF.  REVIEW OF SYSTEMS:  As above already.  PHYSICAL EXAMINATION:  GENERAL:  Reveals a well-developed elderly white woman, in no acute distress, though she is very pale. VITAL SIGNS:  Temperature 97.8 to 99.5 Fahrenheit, blood pressure 134/43, pulse 81, respirations 18, 97% on room air. EYES:  Anicteric.  The conjunctivae are pale. MOUTH:  Shows pale mucosa, dentures. NECK:  Supple. CHEST:  Coarse breath sounds throughout the posterior lung fields. HEART:  Distant S1, S2 are heard.  No murmurs, rubs or gallops. ABDOMEN:  Soft and nontender.  Bowel sounds are present.  There is no organomegaly or mass. RECTAL EXAMINATION:  Not repeated and is reported to be heme-positive without obvious melena or hematochezia. EXTREMITIES:  No edema. NEUROLOGIC:  She is awake, alert and oriented.  She has a right hemiparesis. LYMPH NODE:  No neck or supraclavicular nodes palpated. SKIN:  Periorbital skin changes with mildly violaceous and erythematous shiny skin changes that are on the Bowers.  This heliotrope like rash raises some question of dermatomyositis, though that is not entirely clear to me.  She said she developed that after her stroke.  Question if she has some type of autoimmune or collagen vascular overlap.  LABORATORY DATA:  Shows numbers as already described above.  CK-MB and troponin are negative.  Platelet 412,000.  Glucose 127.  BUN is 23, creatinine 1.1, sodium slightly low at 134.     Michelle Boop, MD,FACG     CEG/MEDQ  D:  09/03/2010  T:  09/03/2010  Job:  [id number]  cc:   Michelle Bowers, M.D. Fax:  [phone]  Electronically Signed by Michelle Bowers MDFACG on 09/09/2010 05:50:23 PM

## 2010-09-10 ENCOUNTER — Encounter: Payer: Self-pay | Admitting: Gastroenterology

## 2010-10-14 NOTE — Discharge Summary (Signed)
[name], [name]             ACCOUNT NO.:  [id number]  MEDICAL RECORD NO.:  [id number]  LOCATION:  1406                         FACILITY:  [facility]  PHYSICIAN:  Gwen Pounds, MD       DATE OF BIRTH:  [birth year]  DATE OF ADMISSION:  09/03/2010 DATE OF DISCHARGE:                              DISCHARGE SUMMARY   DISCHARGE DIAGNOSES:  Include: 1. Significant lower gastrointestinal bleed secondary to cecal     arteriovenous malformations. 2. Symptomatic anemia with hemoglobin as low as 4.8 without any     hemodynamic compromise. 3. Known history of congestive heart failure with mild volume overload     due to the fluid and blood resuscitation. 4. Prior cerebrovascular accident with right hemiparesis. 5. Known history of chronic obstructive pulmonary disease, congestive     heart failure and intermittent hypoxia. 6. Diabetes mellitus type 2. 7. Dysuria and probable urinary tract infection.  Urinalysis is     currently pending. 8. Prior history of gastrointestinal bleed, profound anemia and blood     transfusions. 9. History of lymphocytic colitis diagnosed in 2011, treated with     Lialda. 10.Hypertension. 11.Dyslipidemia. 12.Depression. 13.Benign left renal mass. 14.History of breast implant. 15.Gallstones with negative HIDA. 16.Osteoarthritis. 17.Degenerative joint disease. 18.History of respiratory failure. 19.Hypokalemia. 20.Premature ventricular contractions and an episode of a 4-beat run     of ventricular tachycardia when her labs showed hypokalemia.  This     is resolved. 21.History of dysphagia.  DISCHARGE MEDICATION:  List includes: 1. Iron sulfate 325 mg by mouth three times a day. 2. Glimepiride 2 mg by mouth q.a.m. 3. Senokot-S 1 tablet by mouth daily at bedtime, okay to hold if     diarrhea. 4. Cymbalta 30 mg p.o. q.a.m. 5. Fish oil 1000 mg by mouth daily. 6. Florastor 1 capsule by mouth daily for 30 days. 7. Humalog sliding scale insulin and 3 units  subcutaneous with     breakfast to be given the exact same way she had it prior to this     hospital stay when she was in [place]. 8. Hydralazine 100 mg three times a day. 9. Lantus 8 units subcu q.12 h 10.Lasix 40 mg p.o. daily 11.Lialda 1.2 g 2 capsules by mouth daily. 12.K-Dur 40 mEq p.o. daily 13.Norvasc 10 mg p.o. q.a.m. 14.Prevacid 30 mg p.o. daily 15.Tylenol 650 mg 1 tablet by mouth every 6 hours as needed for fever     or pain. 16.Vitamin D3 1000 units by mouth daily.  She is to discontinue aspirin and Plavix at this current time.  DISCHARGE PROCEDURES:  Include: 1. Multiple packed red blood cell transfusion.  Discontinuation of her     aspirin and Plavix and fish oil while in the hospital. 2. Chest x-rays on July 24, July 27 and September 08, 2010, nearly one     suspected mild CHF.  The last one on September 08, 2010, shows stable to     improved.  No acute findings. 3. Consultation with GI 4. Upper and lower endoscopy.  Colonoscopy showed should 5 to 10 mm     AVMs in the cecum, 5 mm AVM in the ascending colon  with  recommendations to consider IV iron replacement and if iron-     deficiency anemia persists or the patient has an over GI bleed,     consider AVM ablation with the patient off Plavix for at least 5 to     7 days.  Upper endoscopy showed 2 mm AVM in the bulb of the     duodenum.  Biopsies were obtained and current report is pending. 5. Medical management.  HISTORY OF PRESENT ILLNESS:  Briefly, [name] is a 77 year old female patient of Dr. Evlyn Kanner who resides at [facility] since her prior stroke, has had issues with her colon in the past including GI bleeds.  She remains on Lialda for microscopic colitis.  She has had blood transfusions in April and June.  She denied any melena or bright red blood per rectum but she is not the one that cleans this up; because of her stroke, she gets cleaned up by the nurses.  She did complain of burning with eating.   On September 03, 2010, she was transferred to the emergency department with weakness, failure to thrive, low-grade temperature, pale and decreased saturations.  In the ED, her workup was positive for hemoglobin of 4.8.  She was hemodynamically stable.  The hemoglobin at 4.8 was down from 7.4 on August 19, 2010.  Other workup was negative.  She was admitted to the ICU for aggressive blood transfusions.  GI was consulted and she was taken off her aspirin and Plavix at the current time.  HOSPITAL COURSE:  [name] was admitted through the emergency department to the ICU step-down area with lower GI bleed, hemoglobin 4.8, she was hemodynamically stable.  Rest of her labs were fine. Options included transfuse, continued GI workup and monitor and if she got sicker, consider surgical consultation for hemicolectomy, order red blood cell scan or consider embolectomy.  None of the more aggressive treatments were necessary.  She did very well with aggressive care and aggressive transfusions.  She eventually underwent the upper endoscopy and lower endoscopy, and there was no continued bleeding.  Her hemoglobin on September 04, 2010, was 9.5; on September 05, 2010, was 9.1; on September 06, 2010, was 8.9; on September 07, 2010, went back up to 9; September 08, 2010, it was 9.4 and September 09, 2010, which is today her hemoglobin is 10.0.  So she remained stable hemoglobin and she has had some IV iron and continues on aggressive oral iron replacement and she is medically stable and ready for discharge back to [facility] today.  Initially with some dietary adjustments, we backed off on her diabetic regimen.  Her blood sugars were stable.  Her blood sugars started going up, we were heading close back to her baseline regimen and her blood sugars in the last 24 hours including this morning at 135, the last few were 183, 174, 205 and 180.  She is to follow the same Humalog sliding scale and CBG checks that she had prior to coming  to the hospital and I believe Dr. Evlyn Kanner will increase her regimen back to where it was prior over the next week or so.  She remained on DVT prophylaxis with squeezers throughout her hospital stay.  Obviously Lovenox seemed to be too dangerous at this point.  She had some intermittent needs for oxygen as her oxygen sats sometimes dropped.  She was placed back on her Lasix after she was hemodynamically stable but she did very well, her chest x-rays as listed above,  and she is not overtly ill from a pulmonary standpoint.  I supposed at this point oxygen 2 L nasal cannula to be used as needed or overnight when oxygen sats fall below 92% is appropriate.  Dr. Stan Head is the GI doctor who saw her in the emergency department, saw that she had a B12 borderline 287 in the past and gave her B12 shot in the emergency department.  He was also worried about a heliotropic rash and possible autoimmune condition.  She does have some discoloration of her skin around her eyes but she does not have any evidence of autoimmune condition.  This will be followed up by Dr. Evlyn Kanner and then worked up only if needed.  On monitor, she did have 4 PVCs in a row, consider nonsustained V-tach. She was sleeping, there were no symptoms.  Telemetry has been reviewed. She was hypokalemic.  At that time, her potassium was replaced.  She has had no further issues on telemetry.  She was followed throughout July 28, July 29, and September 09, 2010, over the weekend and she has had no evidence of lower GI bleeding.  Her hemoglobin is stable.  She is eating.  She is not short of breath.  She is back to her baseline.  Her Foley came out.  She got mild dysuria and may even have urinary tract infection.  Urinalysis has just been checked.  If it is positive, she will call over and order for antibiotics just to complete at the nursing home.  She normally is independent and those will go back on.  Her physical exam is  unchanged. She has got right paresis.  Her sodium was 139, potassium 3.3 and will be replaced and she will stay on potassium from here out.  Her chloride is 102, bicarb 26, BUN 10, creatinine 0.73, glucose 99, hemoglobin is 10.  White count 1.4, platelet count 374.  Therefore, her anemia is stable.  Her lower GI bleed in stable and she is ready for discharge back to [facility] and I will leave it to Dr. Evlyn Kanner whether he wants to restart aspirin or Plavix or both in the future.  The patient is reluctant and I told her that maybe aspirin at low dose maybe in 3 to 4 weeks may be appropriate based on the history of her stroke.  For her cecal AVM, she may need a procedure in the next few weeks if there is any further evidence of GI bleeding.  She will need a CBC and BMET probably in the next week or so just to follow up on these values including the potassium and hemoglobin.  For COPD, congestive heart failure, and hypoxia, I think oxygen as needed is appropriate and p.r.n. chest x-rays depending on her symptoms.  As far as her diabetes is concerned, her blood sugars returned to baseline and she is doing well.  As far as her dysuria, urinalysis has been checked and will be followed up upon.  She will be discharged back to [facility] in stable condition.  The UA came back Positive and she was treated for a UTI at her facility.  Gwen Pounds, MD     JMR/MEDQ  D:  09/09/2010  T:  09/09/2010  Job:  [id number]  cc:   Tera Mater. Evlyn Kanner, M.D. Fax: [phone]  Venita Lick. Russella Dar, MD, FACG [address] [place] [address]  Electronically Signed by Creola Corn MD on 10/14/2010 09:40:47 AM

## 2010-11-06 LAB — CROSSMATCH
ABO/RH(D): O NEG
Antibody Screen: NEGATIVE

## 2013-11-02 ENCOUNTER — Other Ambulatory Visit: Payer: Self-pay | Admitting: Endocrinology

## 2013-11-02 DIAGNOSIS — N63 Unspecified lump in unspecified breast: Secondary | ICD-10-CM

## 2013-11-09 ENCOUNTER — Ambulatory Visit
Admission: RE | Admit: 2013-11-09 | Discharge: 2013-11-09 | Disposition: A | Discharge status: Home / Self Care | Payer: Medicare Other | Source: Ambulatory Visit | Attending: Endocrinology | Admitting: Endocrinology

## 2013-11-09 DIAGNOSIS — N63 Unspecified lump in unspecified breast: Secondary | ICD-10-CM

## 2014-07-07 ENCOUNTER — Emergency Department (HOSPITAL_COMMUNITY): Payer: Medicare Other

## 2014-07-07 ENCOUNTER — Emergency Department (HOSPITAL_COMMUNITY)
Admission: EM | Admit: 2014-07-07 | Discharge: 2014-07-07 | Disposition: A | Discharge status: Home / Self Care | Payer: Medicare Other | Attending: Emergency Medicine | Admitting: Emergency Medicine

## 2014-07-07 ENCOUNTER — Encounter (HOSPITAL_COMMUNITY): Payer: Self-pay | Admitting: Nurse Practitioner

## 2014-07-07 DIAGNOSIS — Z8673 Personal history of transient ischemic attack (TIA), and cerebral infarction without residual deficits: Secondary | ICD-10-CM | POA: Diagnosis not present

## 2014-07-07 DIAGNOSIS — R938 Abnormal findings on diagnostic imaging of other specified body structures: Secondary | ICD-10-CM | POA: Insufficient documentation

## 2014-07-07 DIAGNOSIS — Z87891 Personal history of nicotine dependence: Secondary | ICD-10-CM | POA: Diagnosis not present

## 2014-07-07 DIAGNOSIS — N189 Chronic kidney disease, unspecified: Secondary | ICD-10-CM | POA: Insufficient documentation

## 2014-07-07 DIAGNOSIS — Z87828 Personal history of other (healed) physical injury and trauma: Secondary | ICD-10-CM | POA: Diagnosis not present

## 2014-07-07 DIAGNOSIS — E119 Type 2 diabetes mellitus without complications: Secondary | ICD-10-CM | POA: Diagnosis not present

## 2014-07-07 DIAGNOSIS — M25511 Pain in right shoulder: Secondary | ICD-10-CM | POA: Diagnosis present

## 2014-07-07 DIAGNOSIS — R52 Pain, unspecified: Secondary | ICD-10-CM

## 2014-07-07 DIAGNOSIS — I129 Hypertensive chronic kidney disease with stage 1 through stage 4 chronic kidney disease, or unspecified chronic kidney disease: Secondary | ICD-10-CM | POA: Diagnosis not present

## 2014-07-07 DIAGNOSIS — R9389 Abnormal findings on diagnostic imaging of other specified body structures: Secondary | ICD-10-CM

## 2014-07-07 DIAGNOSIS — Z8659 Personal history of other mental and behavioral disorders: Secondary | ICD-10-CM | POA: Insufficient documentation

## 2014-07-07 DIAGNOSIS — J9811 Atelectasis: Secondary | ICD-10-CM | POA: Diagnosis not present

## 2014-07-07 DIAGNOSIS — J449 Chronic obstructive pulmonary disease, unspecified: Secondary | ICD-10-CM | POA: Insufficient documentation

## 2014-07-07 HISTORY — DX: Major depressive disorder, single episode, unspecified: F32.9

## 2014-07-07 HISTORY — DX: Cerebral infarction, unspecified: I63.9

## 2014-07-07 HISTORY — DX: Disorder of kidney and ureter, unspecified: N28.9

## 2014-07-07 HISTORY — DX: Type 2 diabetes mellitus without complications: E11.9

## 2014-07-07 HISTORY — DX: Chronic obstructive pulmonary disease, unspecified: J44.9

## 2014-07-07 HISTORY — DX: Chronic kidney disease, unspecified: N18.9

## 2014-07-07 HISTORY — DX: Depression, unspecified: F32.A

## 2014-07-07 HISTORY — DX: Essential (primary) hypertension: I10

## 2014-07-07 HISTORY — DX: Pure hypercholesterolemia, unspecified: E78.00

## 2014-07-07 LAB — CBG MONITORING, ED
Glucose-Capillary: 126 mg/dL — ABNORMAL HIGH (ref 65–99)
Glucose-Capillary: 83 mg/dL (ref 65–99)

## 2014-07-07 NOTE — ED Provider Notes (Signed)
CSN: [id number]     Arrival date & time 07/07/14  1102 History   First MD Initiated Contact with Patient 07/07/14 1117     Chief Complaint  Patient presents with  . Shoulder Pain     HPI Per EMS pt from [facility] to be evaluated for shoulder injury, staff denies fall or injury, [facility] and noted right shoulder is dislocated. Pt has 2+ radial and ulnar pulses in right arm. Patient skin warm and dry. Pain 5/10. Sensation in tact, but patient unable to move fingers.  Past Medical History  Diagnosis Date  . Stroke   . Depression   . Hypercholesteremia   . Hypertension   . Diabetes mellitus without complication   . COPD (chronic obstructive pulmonary disease)   . Renal disorder   . Chronic kidney disease    Past Surgical History  Procedure Laterality Date  . Abdominal hysterectomy      partial   No family history on file. History  Substance Use Topics  . Smoking status: Former Smoker    Quit date: 07/06/2008  . Smokeless tobacco: Not on file  . Alcohol Use: No   OB History    No data available     Review of Systems  All other systems reviewed and are negative.     Allergies  Codeine; Doxazosin mesylate; Gemfibrozil; Prednisone; and Rosiglitazone maleate  Home Medications   Prior to Admission medications   Not on File   BP 151/64 mmHg  Pulse 85  Temp(Src) 98.5 F (36.9 C) (Oral)  Resp 26  Ht 5\' 2"  (1.575 m)  Wt 123 lb (55.792 kg)  BMI 22.49 kg/m2  SpO2 93% Physical Exam  Constitutional: She is oriented to person, place, and time. She appears well-developed and well-nourished. No distress.  HENT:  Head: Normocephalic and atraumatic.  Eyes: Pupils are equal, round, and reactive to light.  Neck: Normal range of motion.  Cardiovascular: Normal rate and intact distal pulses.   Pulmonary/Chest: No respiratory distress. She has no rales. She exhibits no tenderness.  Abdominal: Normal appearance. She exhibits no distension. There  is no tenderness. There is no rebound and no guarding.  Musculoskeletal: Normal range of motion.  Neurological: She is alert and oriented to person, place, and time. No cranial nerve deficit.  Skin: Skin is warm and dry. No rash noted.  Psychiatric: She has a normal mood and affect. Her behavior is normal.  Nursing note and vitals reviewed.   ED Course  Procedures (including critical care time) Labs Review Labs Reviewed  CBG MONITORING, ED - Abnormal; Notable for the following:    Glucose-Capillary 126 (*)    All other components within normal limits    Imaging Review Dg Chest 1 View  07/07/2014   CLINICAL DATA:  Fall on right shoulder.  Pain.  EXAM: CHEST  1 VIEW  COMPARISON:  09/08/2010  FINDINGS: Patient rotated minimally right. Cardiomegaly accentuated by AP portable technique. Atherosclerosis in the transverse aorta. Bilateral calcified breast implants. Probable small bilateral pleural effusions. No pneumothorax. Interval right hemidiaphragm elevation versus right base airspace disease. Mild pulmonary venous congestion, superimposed upon low lung volumes.  IMPRESSION: Cardiomegaly and low lung volumes with mild pulmonary venous congestion.  Interval right hemidiaphragm elevation versus right base airspace disease. Consider further evaluation with PA and lateral radiographs.  Probable small bilateral pleural effusions.   Electronically Signed   By: Jeronimo GreavesKyle  Talbot M.D.   On: 07/07/2014 12:47   Dg  Chest 2 View  07/07/2014   CLINICAL DATA:  Shortness of breath.  EXAM: CHEST  2 VIEW  COMPARISON:  Same day.  FINDINGS: Stable cardiomegaly. Hypoinflation of the lungs is noted. No pneumothorax or significant pleural effusion is noted. Mild central pulmonary vascular congestion is noted. Elevated right hemidiaphragm is again noted. Probable right basilar atelectasis is noted. Calcified aortic valve is noted. Calcified bilateral breast implants are noted. Bony thorax is intact.  IMPRESSION:  Hypoinflation of the lungs is noted. Mild central pulmonary vascular congestion is noted. Probable right basilar atelectasis. Elevated right hemidiaphragm is noted.   Electronically Signed   By: Lupita Raider, M.D.   On: 07/07/2014 14:26   Dg Shoulder Right  07/07/2014   CLINICAL DATA:  Pain following fall  EXAM: RIGHT SHOULDER - 2+ VIEW  COMPARISON:  None.  FINDINGS: Frontal and Y scapular images obtained. There is no demonstrable fracture or dislocation. There is mild generalized joint space narrowing. No erosive change. There is axillary artery calcification. Bones are osteoporotic.  IMPRESSION: Osteoarthritic change. Bones osteoporotic. No acute fracture or dislocation apparent.   Electronically Signed   By: Bretta Bang III M.D.   On: 07/07/2014 12:48      MDM   Final diagnoses:  Abnormal chest x-ray  Atelectasis        Nelva Nay, MD 07/07/14 1434

## 2014-07-07 NOTE — ED Notes (Signed)
Checked patient blood sugar it was 83 notified RN of blood sugar

## 2014-07-07 NOTE — ED Notes (Signed)
Gave the patient a turkey sandwich.  

## 2014-07-07 NOTE — Discharge Instructions (Signed)
Atelectasis Atelectasis is a collapse of the small air sacs in the lungs (alveoli). When this occurs, all or part of a lung collapses and becomes airless. It can be caused by various things and is a common problem after surgery. The severity of atelectasis will vary depending on the size of the area involved and the underlying cause of the condition. CAUSES  There are multiple causes for atelectasis:   Shallow breathing, particularly if there is an injury to your chest wall or abdomen that makes it painful to take a deep breath. This commonly occurs after surgery.  Obstruction of your airways (bronchi or bronchioles). This may be caused by a buildup of mucus (mucus plug), tumors, blood clots (pulmonary embolus), or inhaled foreign bodies. Mucus plugs occur when the lungs do not expand enough to get rid of mucus.  Outside pressure on the lung. This may be caused by tumors, fluid (pleural effusion), or a leakage of air between the lung and rib cage (pneumothorax).   Infections such as pneumonia.  Scarring in lung tissue left over from previous infection or injury.  Some diseases such as cystic fibrosis. SIGNS AND SYMPTOMS  Often, atelectasis will have no symptoms. When symptoms occur, they include:  Shortness of breath.   Bluish color to your nails, lips, or mouth (cyanosis). DIAGNOSIS  Your health care provider may suspect atelectasis based on symptoms and physical findings. A chest X-ray may be done to confirm the diagnosis. More specialized X-ray exams are sometimes required.  TREATMENT  Treatment will depend on the cause of the atelectasis. Treatment may include:  Purposeful coughing to loosen mucus plugs in the lungs.  Chest physiotherapy. This consists of clapping or percussion on the chest over the lungs to further loosen mucus plugs.  Postural drainage techniques. This involves positioning your body so your head is lower than your chest. HOME CARE INSTRUCTIONS  Practice  relaxed deep breathing whenever you are sitting down. A good technique is to take a few relaxed deep breaths each time a commercial comes on if you are watching television.  If you were given a deep breathing device (such as an incentive spirometer) or a mucus clearance device, use this regularly as directed by your health care provider.  Try to cough several times a day as directed by your health care provider.  Perform any chest physiotherapy or postural drainage techniques as directed by your health care provider. If necessary, have someone (such as a family member) assist you with these techniques.  When lying down, lie on the unaffected side to encourage mucus drainage.  Stay physically active as much as possible. SEEK IMMEDIATE MEDICAL CARE IF:   You develop increasing problems with your breathing.   You develop severe chest pain.   You develop severe coughing, or you cough up blood.   You have a fever or persistent symptoms for more than 2-3 days.   You have a fever and your symptoms suddenly get worse.  MAKE SURE YOU:  Understand these instructions.  Will watch your condition.  Will get help right away if you are not doing well or get worse. Document Released: 01/27/2005 Document Revised: 02/01/2013 Document Reviewed: 08/04/2012 ExitCare Patient Information 2015 [employer]. This information is not intended to replace advice given to you by your health care provider. Make sure you discuss any questions you have with your health care provider.  

## 2014-07-07 NOTE — ED Notes (Signed)
Per EMS pt from [facility] to be evaluated for shoulder injury, staff denies fall or injury, [facility] and noted right shoulder is dislocated. Pt has 2+ radial and ulnar pulses in right arm. Patient skin warm and dry. Pain 5/10. Sensation in tact, but patient unable to move fingers.

## 2014-07-07 NOTE — ED Notes (Signed)
Pt returned from X-ray.  

## 2014-07-07 NOTE — ED Notes (Signed)
Notified [facility] for transportation back home 

## 2014-08-07 ENCOUNTER — Emergency Department (HOSPITAL_COMMUNITY)
Admission: EM | Admit: 2014-08-07 | Discharge: 2014-08-07 | Disposition: A | Discharge status: Home / Self Care | Payer: Medicare Other | Attending: Emergency Medicine | Admitting: Emergency Medicine

## 2014-08-07 ENCOUNTER — Emergency Department (HOSPITAL_COMMUNITY): Payer: Medicare Other

## 2014-08-07 ENCOUNTER — Encounter (HOSPITAL_COMMUNITY): Payer: Self-pay | Admitting: Emergency Medicine

## 2014-08-07 DIAGNOSIS — S0081XA Abrasion of other part of head, initial encounter: Secondary | ICD-10-CM | POA: Diagnosis not present

## 2014-08-07 DIAGNOSIS — X58XXXA Exposure to other specified factors, initial encounter: Secondary | ICD-10-CM | POA: Insufficient documentation

## 2014-08-07 DIAGNOSIS — Z87891 Personal history of nicotine dependence: Secondary | ICD-10-CM | POA: Insufficient documentation

## 2014-08-07 DIAGNOSIS — Z8659 Personal history of other mental and behavioral disorders: Secondary | ICD-10-CM | POA: Diagnosis not present

## 2014-08-07 DIAGNOSIS — Y939 Activity, unspecified: Secondary | ICD-10-CM | POA: Diagnosis not present

## 2014-08-07 DIAGNOSIS — I129 Hypertensive chronic kidney disease with stage 1 through stage 4 chronic kidney disease, or unspecified chronic kidney disease: Secondary | ICD-10-CM | POA: Diagnosis not present

## 2014-08-07 DIAGNOSIS — Y998 Other external cause status: Secondary | ICD-10-CM | POA: Diagnosis not present

## 2014-08-07 DIAGNOSIS — E11649 Type 2 diabetes mellitus with hypoglycemia without coma: Secondary | ICD-10-CM | POA: Insufficient documentation

## 2014-08-07 DIAGNOSIS — Y929 Unspecified place or not applicable: Secondary | ICD-10-CM | POA: Insufficient documentation

## 2014-08-07 DIAGNOSIS — N189 Chronic kidney disease, unspecified: Secondary | ICD-10-CM | POA: Insufficient documentation

## 2014-08-07 DIAGNOSIS — Z8673 Personal history of transient ischemic attack (TIA), and cerebral infarction without residual deficits: Secondary | ICD-10-CM | POA: Diagnosis not present

## 2014-08-07 DIAGNOSIS — I509 Heart failure, unspecified: Secondary | ICD-10-CM | POA: Insufficient documentation

## 2014-08-07 DIAGNOSIS — E162 Hypoglycemia, unspecified: Secondary | ICD-10-CM

## 2014-08-07 DIAGNOSIS — J441 Chronic obstructive pulmonary disease with (acute) exacerbation: Secondary | ICD-10-CM | POA: Insufficient documentation

## 2014-08-07 HISTORY — DX: Heart failure, unspecified: I50.9

## 2014-08-07 LAB — URINALYSIS, ROUTINE W REFLEX MICROSCOPIC
BILIRUBIN URINE: NEGATIVE
Glucose, UA: NEGATIVE mg/dL
HGB URINE DIPSTICK: NEGATIVE
KETONES UR: NEGATIVE mg/dL
Nitrite: NEGATIVE
Protein, ur: 100 mg/dL — AB
SPECIFIC GRAVITY, URINE: 1.014 (ref 1.005–1.030)
UROBILINOGEN UA: 0.2 mg/dL (ref 0.0–1.0)
pH: 5 (ref 5.0–8.0)

## 2014-08-07 LAB — COMPREHENSIVE METABOLIC PANEL
ALT: 14 U/L (ref 14–54)
ANION GAP: 10 (ref 5–15)
AST: 20 U/L (ref 15–41)
Albumin: 3.5 g/dL (ref 3.5–5.0)
Alkaline Phosphatase: 81 U/L (ref 38–126)
BUN: 28 mg/dL — AB (ref 6–20)
CALCIUM: 9.5 mg/dL (ref 8.9–10.3)
CO2: 26 mmol/L (ref 22–32)
Chloride: 101 mmol/L (ref 101–111)
Creatinine, Ser: 1.28 mg/dL — ABNORMAL HIGH (ref 0.44–1.00)
GFR, EST AFRICAN AMERICAN: 44 mL/min — AB (ref >60)
GFR, EST NON AFRICAN AMERICAN: 38 mL/min — AB (ref >60)
GLUCOSE: 186 mg/dL — AB (ref 65–99)
Potassium: 4.4 mmol/L (ref 3.5–5.1)
SODIUM: 137 mmol/L (ref 135–145)
TOTAL PROTEIN: 6.3 g/dL — AB (ref 6.5–8.1)
Total Bilirubin: 0.3 mg/dL (ref 0.3–1.2)

## 2014-08-07 LAB — CBC WITH DIFFERENTIAL/PLATELET
BASOS ABS: 0 10*3/uL (ref 0.0–0.1)
BASOS PCT: 0 % (ref 0–1)
EOS PCT: 1 % (ref 0–5)
Eosinophils Absolute: 0.1 10*3/uL (ref 0.0–0.7)
HEMATOCRIT: 42.8 % (ref 36.0–46.0)
Hemoglobin: 13.9 g/dL (ref 12.0–15.0)
Lymphocytes Relative: 4 % — ABNORMAL LOW (ref 12–46)
Lymphs Abs: 0.5 10*3/uL — ABNORMAL LOW (ref 0.7–4.0)
MCH: 33.2 pg (ref 26.0–34.0)
MCHC: 32.5 g/dL (ref 30.0–36.0)
MCV: 102.1 fL — AB (ref 78.0–100.0)
MONO ABS: 1 10*3/uL (ref 0.1–1.0)
MONOS PCT: 7 % (ref 3–12)
Neutro Abs: 12 10*3/uL — ABNORMAL HIGH (ref 1.7–7.7)
Neutrophils Relative %: 88 % — ABNORMAL HIGH (ref 43–77)
Platelets: 238 10*3/uL (ref 150–400)
RBC: 4.19 MIL/uL (ref 3.87–5.11)
RDW: 13.6 % (ref 11.5–15.5)
WBC: 13.7 10*3/uL — AB (ref 4.0–10.5)

## 2014-08-07 LAB — CBG MONITORING, ED
GLUCOSE-CAPILLARY: 137 mg/dL — AB (ref 65–99)
GLUCOSE-CAPILLARY: 190 mg/dL — AB (ref 65–99)
Glucose-Capillary: 68 mg/dL (ref 65–99)
Glucose-Capillary: 204 mg/dL — ABNORMAL HIGH (ref 65–99)
Glucose-Capillary: 63 mg/dL — ABNORMAL LOW (ref 65–99)
Glucose-Capillary: 230 mg/dL — ABNORMAL HIGH (ref 65–99)

## 2014-08-07 LAB — TROPONIN I: Troponin I: 0.03 ng/mL (ref <0.031)

## 2014-08-07 LAB — URINE MICROSCOPIC-ADD ON

## 2014-08-07 MED ORDER — DEXTROSE 50 % IV SOLN
INTRAVENOUS | Status: AC
  Filled 2014-08-07: qty 50

## 2014-08-07 MED ORDER — SODIUM CHLORIDE 0.9 % IV BOLUS (SEPSIS): 500.0000 mL | Freq: Once | INTRAVENOUS | Status: DC

## 2014-08-07 MED ORDER — DEXTROSE 50 % IV SOLN
1.0000 | Freq: Once | INTRAVENOUS | Status: AC
  Administered 2014-08-07: 50 mL via INTRAVENOUS

## 2014-08-07 MED ORDER — DEXTROSE-NACL 5-0.45 % IV SOLN
INTRAVENOUS | Status: DC
  Administered 2014-08-07: 16:00:00 via INTRAVENOUS

## 2014-08-07 NOTE — ED Notes (Signed)
Pt returned from xray, placed back on monitor.

## 2014-08-07 NOTE — ED Notes (Signed)
Attempted to call report to [facility]- No answer.

## 2014-08-07 NOTE — ED Notes (Addendum)
Pt CBG, 230. Nurse was notified.

## 2014-08-07 NOTE — ED Provider Notes (Signed)
Patient was signed out from Dr. Rubin PayorPickering for continued monitoring of hypoglycemia. The patient's mental status had cleared. She was alert upon several rechecks by myself. She ate without difficulty. We have monitored her blood sugar and there has been no recurrence of hypoglycemia. At this point the patient appears stable for ongoing monitoring in her nursing home environment.  Arby BarretteMarcy Murdis Flitton, MD 08/07/14 2037

## 2014-08-07 NOTE — Discharge Instructions (Signed)
Blood Glucose Monitoring °Monitoring your blood glucose (also know as blood sugar) helps you to manage your diabetes. It also helps you and your health care provider monitor your diabetes and determine how well your treatment plan is working. °WHY SHOULD YOU MONITOR YOUR BLOOD GLUCOSE? °· It can help you understand how food, exercise, and medicine affect your blood glucose. °· It allows you to know what your blood glucose is at any given moment. You can quickly tell if you are having low blood glucose (hypoglycemia) or high blood glucose (hyperglycemia). °· It can help you and your health care provider know how to adjust your medicines. °· It can help you understand how to manage an illness or adjust medicine for exercise. °WHEN SHOULD YOU TEST? °Your health care provider will help you decide how often you should check your blood glucose. This may depend on the type of diabetes you have, your diabetes control, or the types of medicines you are taking. Be sure to write down all of your blood glucose readings so that this information can be reviewed with your health care provider. See below for examples of testing times that your health care provider may suggest. °Type 1 Diabetes °· Test 4 times a day if you are in good control, using an insulin pump, or perform multiple daily injections. °· If your diabetes is not well controlled or if you are sick, you may need to monitor more often. °· It is a good idea to also monitor: °· Before and after exercise. °· Between meals and 2 hours after a meal. °· Occasionally between 2:00 a.m. and 3:00 a.m. °Type 2 Diabetes °· It can vary with each person, but generally, if you are on insulin, test 4 times a day. °· If you take medicines by mouth (orally), test 2 times a day. °· If you are on a controlled diet, test once a day. °· If your diabetes is not well controlled or if you are sick, you may need to monitor more often. °HOW TO MONITOR YOUR BLOOD GLUCOSE °Supplies  Needed °· Blood glucose meter. °· Test strips for your meter. Each meter has its own strips. You must use the strips that go with your own meter. °· A pricking needle (lancet). °· A device that holds the lancet (lancing device). °· A journal or log book to write down your results. °Procedure °· Wash your hands with soap and water. Alcohol is not preferred. °· Prick the side of your finger (not the tip) with the lancet. °· Gently milk the finger until a small drop of blood appears. °· Follow the instructions that come with your meter for inserting the test strip, applying blood to the strip, and using your blood glucose meter. °Other Areas to Get Blood for Testing °Some meters allow you to use other areas of your body (other than your finger) to test your blood. These areas are called alternative sites. The most common alternative sites are: °· The forearm. °· The thigh. °· The back area of the lower leg. °· The palm of the hand. °The blood flow in these areas is slower. Therefore, the blood glucose values you get may be delayed, and the numbers are different from what you would get from your fingers. Do not use alternative sites if you think you are having hypoglycemia. Your reading will not be accurate. Always use a finger if you are having hypoglycemia. Also, if you cannot feel your lows (hypoglycemia unawareness), always use your fingers for your   blood glucose checks. °ADDITIONAL TIPS FOR GLUCOSE MONITORING °· Do not reuse lancets. °· Always carry your supplies with you. °· All blood glucose meters have a 24-hour "hotline" number to call if you have questions or need help. °· Adjust (calibrate) your blood glucose meter with a control solution after finishing a few boxes of strips. °BLOOD GLUCOSE RECORD KEEPING °It is a good idea to keep a daily record or log of your blood glucose readings. Most glucose meters, if not all, keep your glucose records stored in the meter. Some meters come with the ability to download  your records to your home computer. Keeping a record of your blood glucose readings is especially helpful if you are wanting to look for patterns. Make notes to go along with the blood glucose readings because you might forget what happened at that exact time. Keeping good records helps you and your health care provider to work together to achieve good diabetes management.  °Document Released: 01/30/2003 Document Revised: 06/13/2013 Document Reviewed: 06/21/2012 °ExitCare® Patient Information ©2015 [employer]. This information is not intended to replace advice given to you by your health care provider. Make sure you discuss any questions you have with your health care provider. ° °Hypoglycemia °Hypoglycemia occurs when the glucose in your blood is too low. Glucose is a type of sugar that is your body's main energy source. Hormones, such as insulin and glucagon, control the level of glucose in the blood. Insulin lowers blood glucose and glucagon increases blood glucose. Having too much insulin in your blood stream, or not eating enough food containing sugar, can result in hypoglycemia. Hypoglycemia can happen to people with or without diabetes. It can develop quickly and can be a medical emergency.  °CAUSES  °· Missing or delaying meals. °· Not eating enough carbohydrates at meals. °· Taking too much diabetes medicine. °· Not timing your oral diabetes medicine or insulin doses with meals, snacks, and exercise. °· Nausea and vomiting. °· Certain medicines. °· Severe illnesses, such as hepatitis, kidney disorders, and certain eating disorders. °· Increased activity or exercise without eating something extra or adjusting medicines. °· Drinking too much alcohol. °· A nerve disorder that affects body functions like your heart rate, blood pressure, and digestion (autonomic neuropathy). °· A condition where the stomach muscles do not function properly (gastroparesis). Therefore, medicines and food may not absorb  properly. °· Rarely, a tumor of the pancreas can produce too much insulin. °SYMPTOMS  °· Hunger. °· Sweating (diaphoresis). °· Change in body temperature. °· Shakiness. °· Headache. °· Anxiety. °· Lightheadedness. °· Irritability. °· Difficulty concentrating. °· Dry mouth. °· Tingling or numbness in the hands or feet. °· Restless sleep or sleep disturbances. °· Altered speech and coordination. °· Change in mental status. °· Seizures or prolonged convulsions. °· Combativeness. °· Drowsiness (lethargic). °· Weakness. °· Increased heart rate or palpitations. °· Confusion. °· Pale, gray skin color. °· Blurred or double vision. °· Fainting. °DIAGNOSIS  °A physical exam and medical history will be performed. Your caregiver may make a diagnosis based on your symptoms. Blood tests and other lab tests may be performed to confirm a diagnosis. Once the diagnosis is made, your caregiver will see if your signs and symptoms go away once your blood glucose is raised.  °TREATMENT  °Usually, you can easily treat your hypoglycemia when you notice symptoms. °· Check your blood glucose. If it is less than 70 mg/dl, take one of the following:   °¨ 3-4 glucose tablets.   °¨ ½ cup   juice.   °¨ ½ cup regular soda.   °¨ 1 cup skim milk.   °¨ ½-1 tube of glucose gel.   °¨ 5-6 hard candies.   °· Avoid high-fat drinks or food that may delay a rise in blood glucose levels. °· Do not take more than the recommended amount of sugary foods, drinks, gel, or tablets. Doing so will cause your blood glucose to go too high.   °· Wait 10-15 minutes and recheck your blood glucose. If it is still less than 70 mg/dl or below your target range, repeat treatment.   °· Eat a snack if it is more than 1 hour until your next meal.   °There may be a time when your blood glucose may go so low that you are unable to treat yourself at home when you start to notice symptoms. You may need someone to help you. You may even faint or be unable to swallow. If you cannot  treat yourself, someone will need to bring you to the hospital.  °HOME CARE INSTRUCTIONS °· If you have diabetes, follow your diabetes management plan by: °¨ Taking your medicines as directed. °¨ Following your exercise plan. °¨ Following your meal plan. Do not skip meals. Eat on time. °¨ Testing your blood glucose regularly. Check your blood glucose before and after exercise. If you exercise longer or different than usual, be sure to check blood glucose more frequently. °¨ Wearing your medical alert jewelry that says you have diabetes. °· Identify the cause of your hypoglycemia. Then, develop ways to prevent the recurrence of hypoglycemia. °· Do not take a hot bath or shower right after an insulin shot. °· Always carry treatment with you. Glucose tablets are the easiest to carry. °· If you are going to drink alcohol, drink it only with meals. °· Tell friends or family members ways to keep you safe during a seizure. This may include removing hard or sharp objects from the area or turning you on your side. °· Maintain a healthy weight. °SEEK MEDICAL CARE IF:  °· You are having problems keeping your blood glucose in your target range. °· You are having frequent episodes of hypoglycemia. °· You feel you might be having side effects from your medicines. °· You are not sure why your blood glucose is dropping so low. °· You notice a change in vision or a new problem with your vision. °SEEK IMMEDIATE MEDICAL CARE IF:  °· Confusion develops. °· A change in mental status occurs. °· The inability to swallow develops. °· Fainting occurs. °Document Released: 01/27/2005 Document Revised: 02/01/2013 Document Reviewed: 05/26/2011 °ExitCare® Patient Information ©2015 [employer]. This information is not intended to replace advice given to you by your health care provider. Make sure you discuss any questions you have with your health care provider. ° °

## 2014-08-07 NOTE — ED Notes (Signed)
Pt here via EMS from [facility] with c/o hypoglycemia and SOB since this AM. Facility staff reports that she was given her normal insulin this AM and did not eat her tray and that she has been lethargic all day. Pt is normally a/o x 4, now oriented to self, time. Pt c/o SOB starting this AM. Pt always wears 2L Gray but now is wearing 4L to ease berathing.

## 2014-08-07 NOTE — ED Provider Notes (Addendum)
CSN: [id number]     Arrival date & time 08/07/14  1526 History   First MD Initiated Contact with Patient 08/07/14 1533     Chief Complaint  Patient presents with  . Shortness of Breath  . Hypoglycemia  . Fatigue    Level V caveat due to altered mental status (Consider location/radiation/quality/duration/timing/severity/associated sxs/prior Treatment) Patient is a 81 y.o. female presenting with shortness of breath and hypoglycemia. The history is provided by the patient.  Shortness of Breath Hypoglycemia Associated symptoms: shortness of breath    EMS was reported to call out for shortness of breath. Had been hypoglycemic with sugar of 24 and had been given D50 and glucagon. Reportedly had "insulin and not eaten earlier in the day. May have been complaining of chest pain the patient cannot really tell me. Sugar went up and that has come back down and was 80 upon arrival. Still confused. Patient is apparently very talkative at baseline is now rather sedate and somewhat confused.  Past Medical History  Diagnosis Date  . Depression   . Hypercholesteremia   . Hypertension   . Diabetes mellitus without complication   . COPD (chronic obstructive pulmonary disease)   . Renal disorder   . Chronic kidney disease   . CHF (congestive heart failure)   . Stroke     right deficits   Past Surgical History  Procedure Laterality Date  . Abdominal hysterectomy      partial   History reviewed. No pertinent family history. History  Substance Use Topics  . Smoking status: Former Smoker    Quit date: 07/06/2008  . Smokeless tobacco: Not on file  . Alcohol Use: No   OB History    No data available     Review of Systems  Unable to perform ROS Respiratory: Positive for shortness of breath.       Allergies  Codeine; Doxazosin mesylate; Gemfibrozil; Prednisone; and Rosiglitazone maleate  Home Medications   Prior to Admission medications   Not on File   BP 127/62 mmHg  Pulse 60   Temp(Src) 97.4 F (36.3 C) (Oral)  Resp 21  SpO2 96% Physical Exam  Constitutional: She appears well-developed.  HENT:  Abrasion to upper forehead.  Eyes: Pupils are equal, round, and reactive to light.  Cardiovascular: Normal rate.   Pulmonary/Chest: Effort normal.  Abdominal: Soft. There is no tenderness.  Musculoskeletal: Normal range of motion.  Neurological:  Patient is somewhat sleepy but arouses to voice. Answer questions but appears somewhat confused. Will follow some commands. Difficult to get a good exam on due to her sleepiness.  Skin: Skin is warm.    ED Course  Procedures (including critical care time) Labs Review Labs Reviewed  COMPREHENSIVE METABOLIC PANEL  CBC WITH DIFFERENTIAL/PLATELET  URINALYSIS, ROUTINE W REFLEX MICROSCOPIC (NOT AT [facility])  TROPONIN I    Imaging Review No results found.   EKG Interpretation   Date/Time:  Monday August 07 2014 15:30:11 EDT Ventricular Rate:  63 PR Interval:  151 QRS Duration: 96 QT Interval:  453 QTC Calculation: 464 R Axis:   22 Text Interpretation:  Sinus rhythm Left ventricular hypertrophy Anterior  ST elevation, probably due to LVH Confirmed by Rubin Payor  MD, Harrold Donath  [id number]) on 08/07/2014 3:57:00 PM      MDM   Final diagnoses:  Hypoglycemia    Patient with altered mental status and recurrent hypoglycemia. Had to be started on D5 drip. Lab work still pending at this time.   Benjiman Core, MD  08/07/14 1557  Patient now more awake. No abdominal pain no swelling. Will feed patient. X-ray showed possible free air under diaphragm but clinically this is unlikely. Repeat chest x-ray is a 2 view and also add two-view abdomen. Care signed out to Dr. Antionette Fairy, MD 08/07/14 [phone]

## 2014-08-30 ENCOUNTER — Other Ambulatory Visit: Payer: Self-pay | Admitting: Endocrinology

## 2014-08-30 DIAGNOSIS — R9389 Abnormal findings on diagnostic imaging of other specified body structures: Secondary | ICD-10-CM

## 2014-09-01 ENCOUNTER — Other Ambulatory Visit: Payer: Medicare Other

## 2014-09-03 ENCOUNTER — Inpatient Hospital Stay (HOSPITAL_COMMUNITY)
Admission: EM | Admit: 2014-09-03 | Discharge: 2014-09-12 | DRG: 871 | Disposition: A | Discharge status: Skilled Nursing Facility | Payer: Medicare Other | Attending: Internal Medicine | Admitting: Internal Medicine

## 2014-09-03 ENCOUNTER — Other Ambulatory Visit (HOSPITAL_COMMUNITY): Payer: Medicare Other

## 2014-09-03 ENCOUNTER — Emergency Department (HOSPITAL_COMMUNITY): Payer: Medicare Other

## 2014-09-03 ENCOUNTER — Encounter (HOSPITAL_COMMUNITY): Payer: Self-pay

## 2014-09-03 ENCOUNTER — Inpatient Hospital Stay (HOSPITAL_COMMUNITY): Payer: Medicare Other

## 2014-09-03 DIAGNOSIS — Z87891 Personal history of nicotine dependence: Secondary | ICD-10-CM | POA: Diagnosis not present

## 2014-09-03 DIAGNOSIS — F329 Major depressive disorder, single episode, unspecified: Secondary | ICD-10-CM | POA: Diagnosis present

## 2014-09-03 DIAGNOSIS — N183 Chronic kidney disease, stage 3 (moderate): Secondary | ICD-10-CM | POA: Diagnosis present

## 2014-09-03 DIAGNOSIS — E876 Hypokalemia: Secondary | ICD-10-CM | POA: Diagnosis present

## 2014-09-03 DIAGNOSIS — Y95 Nosocomial condition: Secondary | ICD-10-CM | POA: Diagnosis present

## 2014-09-03 DIAGNOSIS — R4182 Altered mental status, unspecified: Secondary | ICD-10-CM | POA: Diagnosis not present

## 2014-09-03 DIAGNOSIS — I10 Essential (primary) hypertension: Secondary | ICD-10-CM | POA: Diagnosis present

## 2014-09-03 DIAGNOSIS — I6340 Cerebral infarction due to embolism of unspecified cerebral artery: Secondary | ICD-10-CM | POA: Diagnosis present

## 2014-09-03 DIAGNOSIS — N179 Acute kidney failure, unspecified: Secondary | ICD-10-CM | POA: Diagnosis present

## 2014-09-03 DIAGNOSIS — R0600 Dyspnea, unspecified: Secondary | ICD-10-CM | POA: Diagnosis not present

## 2014-09-03 DIAGNOSIS — F411 Generalized anxiety disorder: Secondary | ICD-10-CM | POA: Diagnosis present

## 2014-09-03 DIAGNOSIS — I6350 Cerebral infarction due to unspecified occlusion or stenosis of unspecified cerebral artery: Secondary | ICD-10-CM | POA: Diagnosis not present

## 2014-09-03 DIAGNOSIS — E785 Hyperlipidemia, unspecified: Secondary | ICD-10-CM | POA: Diagnosis present

## 2014-09-03 DIAGNOSIS — IMO0002 Reserved for concepts with insufficient information to code with codable children: Secondary | ICD-10-CM | POA: Diagnosis present

## 2014-09-03 DIAGNOSIS — I6521 Occlusion and stenosis of right carotid artery: Secondary | ICD-10-CM | POA: Diagnosis present

## 2014-09-03 DIAGNOSIS — J449 Chronic obstructive pulmonary disease, unspecified: Secondary | ICD-10-CM | POA: Diagnosis present

## 2014-09-03 DIAGNOSIS — E872 Acidosis: Secondary | ICD-10-CM | POA: Diagnosis present

## 2014-09-03 DIAGNOSIS — J90 Pleural effusion, not elsewhere classified: Secondary | ICD-10-CM

## 2014-09-03 DIAGNOSIS — E1165 Type 2 diabetes mellitus with hyperglycemia: Secondary | ICD-10-CM | POA: Diagnosis present

## 2014-09-03 DIAGNOSIS — R0602 Shortness of breath: Secondary | ICD-10-CM

## 2014-09-03 DIAGNOSIS — J9621 Acute and chronic respiratory failure with hypoxia: Secondary | ICD-10-CM | POA: Diagnosis present

## 2014-09-03 DIAGNOSIS — G8191 Hemiplegia, unspecified affecting right dominant side: Secondary | ICD-10-CM | POA: Diagnosis present

## 2014-09-03 DIAGNOSIS — Z794 Long term (current) use of insulin: Secondary | ICD-10-CM

## 2014-09-03 DIAGNOSIS — E118 Type 2 diabetes mellitus with unspecified complications: Secondary | ICD-10-CM

## 2014-09-03 DIAGNOSIS — Z888 Allergy status to other drugs, medicaments and biological substances status: Secondary | ICD-10-CM

## 2014-09-03 DIAGNOSIS — Z885 Allergy status to narcotic agent status: Secondary | ICD-10-CM | POA: Diagnosis not present

## 2014-09-03 DIAGNOSIS — R1310 Dysphagia, unspecified: Secondary | ICD-10-CM | POA: Diagnosis present

## 2014-09-03 DIAGNOSIS — I129 Hypertensive chronic kidney disease with stage 1 through stage 4 chronic kidney disease, or unspecified chronic kidney disease: Secondary | ICD-10-CM | POA: Diagnosis present

## 2014-09-03 DIAGNOSIS — N189 Chronic kidney disease, unspecified: Secondary | ICD-10-CM

## 2014-09-03 DIAGNOSIS — J9602 Acute respiratory failure with hypercapnia: Secondary | ICD-10-CM | POA: Diagnosis present

## 2014-09-03 DIAGNOSIS — D649 Anemia, unspecified: Secondary | ICD-10-CM | POA: Diagnosis present

## 2014-09-03 DIAGNOSIS — R401 Stupor: Secondary | ICD-10-CM | POA: Diagnosis not present

## 2014-09-03 DIAGNOSIS — A419 Sepsis, unspecified organism: Secondary | ICD-10-CM | POA: Diagnosis present

## 2014-09-03 DIAGNOSIS — E780 Pure hypercholesterolemia: Secondary | ICD-10-CM | POA: Diagnosis present

## 2014-09-03 DIAGNOSIS — E875 Hyperkalemia: Secondary | ICD-10-CM | POA: Diagnosis present

## 2014-09-03 DIAGNOSIS — Z8673 Personal history of transient ischemic attack (TIA), and cerebral infarction without residual deficits: Secondary | ICD-10-CM | POA: Diagnosis present

## 2014-09-03 DIAGNOSIS — I509 Heart failure, unspecified: Secondary | ICD-10-CM

## 2014-09-03 DIAGNOSIS — Z79899 Other long term (current) drug therapy: Secondary | ICD-10-CM

## 2014-09-03 DIAGNOSIS — R400 Somnolence: Secondary | ICD-10-CM | POA: Diagnosis not present

## 2014-09-03 DIAGNOSIS — J811 Chronic pulmonary edema: Secondary | ICD-10-CM | POA: Diagnosis present

## 2014-09-03 DIAGNOSIS — Z9981 Dependence on supplemental oxygen: Secondary | ICD-10-CM | POA: Diagnosis not present

## 2014-09-03 DIAGNOSIS — J9622 Acute and chronic respiratory failure with hypercapnia: Secondary | ICD-10-CM | POA: Diagnosis present

## 2014-09-03 DIAGNOSIS — K838 Other specified diseases of biliary tract: Secondary | ICD-10-CM | POA: Diagnosis present

## 2014-09-03 DIAGNOSIS — J810 Acute pulmonary edema: Secondary | ICD-10-CM | POA: Diagnosis not present

## 2014-09-03 DIAGNOSIS — R410 Disorientation, unspecified: Secondary | ICD-10-CM | POA: Diagnosis not present

## 2014-09-03 DIAGNOSIS — J690 Pneumonitis due to inhalation of food and vomit: Secondary | ICD-10-CM | POA: Diagnosis present

## 2014-09-03 DIAGNOSIS — J189 Pneumonia, unspecified organism: Secondary | ICD-10-CM | POA: Diagnosis present

## 2014-09-03 DIAGNOSIS — Z720 Tobacco use: Secondary | ICD-10-CM | POA: Diagnosis present

## 2014-09-03 DIAGNOSIS — I6930 Unspecified sequelae of cerebral infarction: Secondary | ICD-10-CM | POA: Diagnosis present

## 2014-09-03 DIAGNOSIS — E119 Type 2 diabetes mellitus without complications: Secondary | ICD-10-CM | POA: Diagnosis present

## 2014-09-03 DIAGNOSIS — I639 Cerebral infarction, unspecified: Secondary | ICD-10-CM | POA: Diagnosis present

## 2014-09-03 DIAGNOSIS — I5031 Acute diastolic (congestive) heart failure: Secondary | ICD-10-CM | POA: Diagnosis present

## 2014-09-03 DIAGNOSIS — I6529 Occlusion and stenosis of unspecified carotid artery: Secondary | ICD-10-CM | POA: Diagnosis present

## 2014-09-03 DIAGNOSIS — R34 Anuria and oliguria: Secondary | ICD-10-CM

## 2014-09-03 DIAGNOSIS — J9690 Respiratory failure, unspecified, unspecified whether with hypoxia or hypercapnia: Secondary | ICD-10-CM | POA: Diagnosis present

## 2014-09-03 LAB — CBG MONITORING, ED: Glucose-Capillary: 239 mg/dL — ABNORMAL HIGH (ref 65–99)

## 2014-09-03 LAB — BLOOD GAS, ARTERIAL
ACID-BASE EXCESS: 7.2 mmol/L — AB (ref 0.0–2.0)
Acid-Base Excess: 8.4 mmol/L — ABNORMAL HIGH (ref 0.0–2.0)
Bicarbonate: 32.8 mEq/L — ABNORMAL HIGH (ref 20.0–24.0)
Bicarbonate: 34.1 mEq/L — ABNORMAL HIGH (ref 20.0–24.0)
DELIVERY SYSTEMS: POSITIVE
DELIVERY SYSTEMS: POSITIVE
DRAWN BY: 105521
DRAWN BY: 105521
EXPIRATORY PAP: 5
Expiratory PAP: 5
FIO2: 0.5 %
FIO2: 0.4 %
INSPIRATORY PAP: 12
Inspiratory PAP: 12
O2 Saturation: 98.4 %
O2 Saturation: 95.9 %
PATIENT TEMPERATURE: 98.6
PH ART: 7.35 (ref 7.350–7.450)
PO2 ART: 101 mmHg — AB (ref 80.0–100.0)
Patient temperature: 98.6
TCO2: 34.7 mmol/L (ref 0–100)
TCO2: 36 mmol/L (ref 0–100)
pCO2 arterial: 61.6 mmHg (ref 35.0–45.0)
pCO2 arterial: 63.4 mmHg (ref 35.0–45.0)
pH, Arterial: 7.346 — ABNORMAL LOW (ref 7.350–7.450)
pO2, Arterial: 80.3 mmHg (ref 80.0–100.0)

## 2014-09-03 LAB — GLUCOSE, CAPILLARY
GLUCOSE-CAPILLARY: 180 mg/dL — AB (ref 65–99)
GLUCOSE-CAPILLARY: 163 mg/dL — AB (ref 65–99)
GLUCOSE-CAPILLARY: 119 mg/dL — AB (ref 65–99)
Glucose-Capillary: 160 mg/dL — ABNORMAL HIGH (ref 65–99)

## 2014-09-03 LAB — POTASSIUM
POTASSIUM: 6 mmol/L — AB (ref 3.5–5.1)
Potassium: 5.6 mmol/L — ABNORMAL HIGH (ref 3.5–5.1)

## 2014-09-03 LAB — CBC WITH DIFFERENTIAL/PLATELET
Basophils Absolute: 0 10*3/uL (ref 0.0–0.1)
Basophils Relative: 0 % (ref 0–1)
EOS ABS: 0.2 10*3/uL (ref 0.0–0.7)
EOS PCT: 1 % (ref 0–5)
HCT: 35.3 % — ABNORMAL LOW (ref 36.0–46.0)
HEMOGLOBIN: 11.2 g/dL — AB (ref 12.0–15.0)
LYMPHS PCT: 5 % — AB (ref 12–46)
Lymphs Abs: 0.6 10*3/uL — ABNORMAL LOW (ref 0.7–4.0)
MCH: 33.3 pg (ref 26.0–34.0)
MCHC: 31.7 g/dL (ref 30.0–36.0)
MCV: 105.1 fL — AB (ref 78.0–100.0)
MONOS PCT: 6 % (ref 3–12)
Monocytes Absolute: 0.9 10*3/uL (ref 0.1–1.0)
NEUTROS PCT: 88 % — AB (ref 43–77)
Neutro Abs: 12.5 10*3/uL — ABNORMAL HIGH (ref 1.7–7.7)
PLATELETS: 265 10*3/uL (ref 150–400)
RBC: 3.36 MIL/uL — ABNORMAL LOW (ref 3.87–5.11)
RDW: 14.3 % (ref 11.5–15.5)
WBC: 14.2 10*3/uL — AB (ref 4.0–10.5)

## 2014-09-03 LAB — BASIC METABOLIC PANEL
Anion gap: 9 (ref 5–15)
Anion gap: 8 (ref 5–15)
BUN: 41 mg/dL — AB (ref 6–20)
BUN: 38 mg/dL — ABNORMAL HIGH (ref 6–20)
CALCIUM: 9.8 mg/dL (ref 8.9–10.3)
CALCIUM: 8.9 mg/dL (ref 8.9–10.3)
CO2: 31 mmol/L (ref 22–32)
CO2: 34 mmol/L — AB (ref 22–32)
CREATININE: 1.42 mg/dL — AB (ref 0.44–1.00)
Chloride: 106 mmol/L (ref 101–111)
Chloride: 98 mmol/L — ABNORMAL LOW (ref 101–111)
Creatinine, Ser: 1.52 mg/dL — ABNORMAL HIGH (ref 0.44–1.00)
GFR calc Af Amer: 36 mL/min — ABNORMAL LOW (ref >60)
GFR calc non Af Amer: 31 mL/min — ABNORMAL LOW (ref >60)
GFR, EST AFRICAN AMERICAN: 39 mL/min — AB (ref >60)
GFR, EST NON AFRICAN AMERICAN: 33 mL/min — AB (ref >60)
GLUCOSE: 188 mg/dL — AB (ref 65–99)
GLUCOSE: 151 mg/dL — AB (ref 65–99)
Potassium: 5.3 mmol/L — ABNORMAL HIGH (ref 3.5–5.1)
Potassium: 3.4 mmol/L — ABNORMAL LOW (ref 3.5–5.1)
SODIUM: 140 mmol/L (ref 135–145)
Sodium: 146 mmol/L — ABNORMAL HIGH (ref 135–145)

## 2014-09-03 LAB — COMPREHENSIVE METABOLIC PANEL
ALT: 12 U/L — AB (ref 14–54)
ANION GAP: 9 (ref 5–15)
AST: 14 U/L — ABNORMAL LOW (ref 15–41)
Albumin: 3 g/dL — ABNORMAL LOW (ref 3.5–5.0)
Alkaline Phosphatase: 73 U/L (ref 38–126)
BUN: 42 mg/dL — AB (ref 6–20)
CO2: 29 mmol/L (ref 22–32)
Calcium: 9.3 mg/dL (ref 8.9–10.3)
Chloride: 100 mmol/L — ABNORMAL LOW (ref 101–111)
Creatinine, Ser: 1.52 mg/dL — ABNORMAL HIGH (ref 0.44–1.00)
GFR calc non Af Amer: 31 mL/min — ABNORMAL LOW (ref >60)
GFR, EST AFRICAN AMERICAN: 36 mL/min — AB (ref >60)
Glucose, Bld: 173 mg/dL — ABNORMAL HIGH (ref 65–99)
Potassium: 6.3 mmol/L (ref 3.5–5.1)
SODIUM: 138 mmol/L (ref 135–145)
TOTAL PROTEIN: 6 g/dL — AB (ref 6.5–8.1)
Total Bilirubin: 0.5 mg/dL (ref 0.3–1.2)

## 2014-09-03 LAB — I-STAT ARTERIAL BLOOD GAS, ED
Acid-Base Excess: 3 mmol/L — ABNORMAL HIGH (ref 0.0–2.0)
BICARBONATE: 30.4 meq/L — AB (ref 20.0–24.0)
O2 Saturation: 93 %
PCO2 ART: 58.9 mmHg — AB (ref 35.0–45.0)
PH ART: 7.32 — AB (ref 7.350–7.450)
PO2 ART: 75 mmHg — AB (ref 80.0–100.0)
TCO2: 32 mmol/L (ref 0–100)

## 2014-09-03 LAB — URINALYSIS, ROUTINE W REFLEX MICROSCOPIC
BILIRUBIN URINE: NEGATIVE
GLUCOSE, UA: NEGATIVE mg/dL
Hgb urine dipstick: NEGATIVE
Ketones, ur: NEGATIVE mg/dL
Nitrite: NEGATIVE
PROTEIN: 100 mg/dL — AB
Specific Gravity, Urine: 1.016 (ref 1.005–1.030)
Urobilinogen, UA: 0.2 mg/dL (ref 0.0–1.0)
pH: 5 (ref 5.0–8.0)

## 2014-09-03 LAB — URINE MICROSCOPIC-ADD ON

## 2014-09-03 LAB — LIPID PANEL
Cholesterol: 169 mg/dL (ref 0–200)
HDL: 76 mg/dL (ref >40)
LDL Cholesterol: 76 mg/dL (ref 0–99)
TRIGLYCERIDES: 84 mg/dL (ref <150)
Total CHOL/HDL Ratio: 2.2 RATIO
VLDL: 17 mg/dL (ref 0–40)

## 2014-09-03 LAB — LACTIC ACID, PLASMA
LACTIC ACID, VENOUS: 1 mmol/L (ref 0.5–2.0)
LACTIC ACID, VENOUS: 1.1 mmol/L (ref 0.5–2.0)
Lactic Acid, Venous: 0.9 mmol/L (ref 0.5–2.0)

## 2014-09-03 LAB — SODIUM, URINE, RANDOM: Sodium, Ur: 64 mmol/L

## 2014-09-03 LAB — MAGNESIUM
MAGNESIUM: 2.3 mg/dL (ref 1.7–2.4)
MAGNESIUM: 2.3 mg/dL (ref 1.7–2.4)

## 2014-09-03 LAB — PROTEIN / CREATININE RATIO, URINE
Creatinine, Urine: 46.1 mg/dL
Protein Creatinine Ratio: 0.63 mg/mg{Cre} — ABNORMAL HIGH (ref 0.00–0.15)
TOTAL PROTEIN, URINE: 29 mg/dL

## 2014-09-03 LAB — TROPONIN I
TROPONIN I: 0.03 ng/mL (ref <0.031)
Troponin I: 0.03 ng/mL (ref <0.031)

## 2014-09-03 LAB — MRSA PCR SCREENING: MRSA BY PCR: NEGATIVE

## 2014-09-03 LAB — CREATININE, URINE, 24 HOUR: Creatinine, Urine: 45.91 mg/dL

## 2014-09-03 LAB — STREP PNEUMONIAE URINARY ANTIGEN: Strep Pneumo Urinary Antigen: NEGATIVE

## 2014-09-03 LAB — I-STAT CG4 LACTIC ACID, ED: Lactic Acid, Venous: 0.75 mmol/L (ref 0.5–2.0)

## 2014-09-03 LAB — BRAIN NATRIURETIC PEPTIDE: B NATRIURETIC PEPTIDE 5: 377.8 pg/mL — AB (ref 0.0–100.0)

## 2014-09-03 MED ORDER — INSULIN ASPART 100 UNIT/ML ~~LOC~~ SOLN: 0.0000 [IU] | SUBCUTANEOUS | Status: DC

## 2014-09-03 MED ORDER — SODIUM CHLORIDE 0.9 % IJ SOLN
10.0000 mL | INTRAMUSCULAR | Status: DC | PRN
  Administered 2014-09-04: 10 mL
  Administered 2014-09-05: 30 mL
  Administered 2014-09-07 (×2): 10 mL
  Administered 2014-09-12: 20 mL
  Filled 2014-09-03 (×5): qty 40

## 2014-09-03 MED ORDER — SODIUM POLYSTYRENE SULFONATE 15 GM/60ML PO SUSP
45.0000 g | Freq: Once | ORAL | Status: AC
  Administered 2014-09-03: 45 g via RECTAL
  Filled 2014-09-03: qty 180

## 2014-09-03 MED ORDER — DEXTROSE 50 % IV SOLN
50.0000 mL | Freq: Once | INTRAVENOUS | Status: AC
  Administered 2014-09-03: 50 mL via INTRAVENOUS
  Filled 2014-09-03: qty 50

## 2014-09-03 MED ORDER — IPRATROPIUM-ALBUTEROL 0.5-2.5 (3) MG/3ML IN SOLN
3.0000 mL | Freq: Four times a day (QID) | RESPIRATORY_TRACT | Status: DC
  Administered 2014-09-03 – 2014-09-07 (×18): 3 mL via RESPIRATORY_TRACT
  Filled 2014-09-03 (×19): qty 3

## 2014-09-03 MED ORDER — INSULIN ASPART 100 UNIT/ML IV SOLN
10.0000 [IU] | Freq: Once | INTRAVENOUS | Status: AC
  Administered 2014-09-03: 10 [IU] via INTRAVENOUS
  Filled 2014-09-03: qty 1

## 2014-09-03 MED ORDER — SODIUM POLYSTYRENE SULFONATE 15 GM/60ML PO SUSP
60.0000 g | Freq: Once | ORAL | Status: AC
  Administered 2014-09-03: 60 g via RECTAL
  Filled 2014-09-03: qty 240

## 2014-09-03 MED ORDER — SODIUM CHLORIDE 0.9 % IV SOLN: INTRAVENOUS | Status: DC

## 2014-09-03 MED ORDER — IPRATROPIUM-ALBUTEROL 0.5-2.5 (3) MG/3ML IN SOLN: 3.0000 mL | Freq: Four times a day (QID) | RESPIRATORY_TRACT | Status: DC | PRN

## 2014-09-03 MED ORDER — ATORVASTATIN CALCIUM 10 MG PO TABS
10.0000 mg | ORAL_TABLET | Freq: Every day | ORAL | Status: DC
  Administered 2014-09-04: 10 mg via ORAL
  Filled 2014-09-03 (×2): qty 1

## 2014-09-03 MED ORDER — SODIUM POLYSTYRENE SULFONATE 15 GM/60ML PO SUSP
60.0000 g | Freq: Once | ORAL | Status: DC
  Filled 2014-09-03: qty 240

## 2014-09-03 MED ORDER — AMLODIPINE BESYLATE 10 MG PO TABS
10.0000 mg | ORAL_TABLET | Freq: Every day | ORAL | Status: DC
  Administered 2014-09-04 – 2014-09-12 (×9): 10 mg via ORAL
  Filled 2014-09-03 (×10): qty 1

## 2014-09-03 MED ORDER — INSULIN DETEMIR 100 UNIT/ML ~~LOC~~ SOLN: 8.0000 [IU] | SUBCUTANEOUS | Status: DC

## 2014-09-03 MED ORDER — ENOXAPARIN SODIUM 40 MG/0.4ML ~~LOC~~ SOLN
40.0000 mg | SUBCUTANEOUS | Status: DC
  Administered 2014-09-03 – 2014-09-05 (×3): 40 mg via SUBCUTANEOUS
  Filled 2014-09-03 (×3): qty 0.4

## 2014-09-03 MED ORDER — INSULIN ASPART 100 UNIT/ML ~~LOC~~ SOLN
0.0000 [IU] | Freq: Three times a day (TID) | SUBCUTANEOUS | Status: DC
  Administered 2014-09-03: 2 [IU] via SUBCUTANEOUS
  Administered 2014-09-04 (×2): 7 [IU] via SUBCUTANEOUS

## 2014-09-03 MED ORDER — ALBUTEROL SULFATE (2.5 MG/3ML) 0.083% IN NEBU
2.5000 mg | INHALATION_SOLUTION | RESPIRATORY_TRACT | Status: DC
  Administered 2014-09-03: 2.5 mg via RESPIRATORY_TRACT
  Filled 2014-09-03: qty 3

## 2014-09-03 MED ORDER — HYDRALAZINE HCL 50 MG PO TABS
100.0000 mg | ORAL_TABLET | Freq: Three times a day (TID) | ORAL | Status: DC
Start: 2014-09-03 — End: 2014-09-12
  Administered 2014-09-04 – 2014-09-12 (×25): 100 mg via ORAL
  Filled 2014-09-03 (×28): qty 2

## 2014-09-03 MED ORDER — ALBUTEROL SULFATE (2.5 MG/3ML) 0.083% IN NEBU: 2.5000 mg | INHALATION_SOLUTION | RESPIRATORY_TRACT | Status: DC

## 2014-09-03 MED ORDER — SODIUM CHLORIDE 0.9 % IJ SOLN
10.0000 mL | Freq: Two times a day (BID) | INTRAMUSCULAR | Status: DC
  Administered 2014-09-04: 20 mL
  Administered 2014-09-04: 10 mL
  Administered 2014-09-05: 20 mL
  Administered 2014-09-05 – 2014-09-11 (×10): 10 mL

## 2014-09-03 MED ORDER — DEXTROSE-NACL 5-0.45 % IV SOLN
INTRAVENOUS | Status: DC
  Administered 2014-09-03 – 2014-09-10 (×3): via INTRAVENOUS

## 2014-09-03 MED ORDER — INSULIN ASPART 100 UNIT/ML ~~LOC~~ SOLN
0.0000 [IU] | Freq: Three times a day (TID) | SUBCUTANEOUS | Status: DC
  Administered 2014-09-03: 2 [IU] via SUBCUTANEOUS

## 2014-09-03 MED ORDER — DOCUSATE SODIUM 100 MG PO CAPS
100.0000 mg | ORAL_CAPSULE | Freq: Two times a day (BID) | ORAL | Status: DC
  Administered 2014-09-04 – 2014-09-12 (×16): 100 mg via ORAL
  Filled 2014-09-03 (×18): qty 1

## 2014-09-03 MED ORDER — DEXTROSE 5 % IV SOLN
160.0000 mg | Freq: Once | INTRAVENOUS | Status: AC
  Administered 2014-09-03: 160 mg via INTRAVENOUS
  Filled 2014-09-03: qty 16

## 2014-09-03 MED ORDER — SODIUM CHLORIDE 0.9 % IV SOLN
1.0000 g | Freq: Once | INTRAVENOUS | Status: AC
  Administered 2014-09-03: 1 g via INTRAVENOUS
  Filled 2014-09-03: qty 10

## 2014-09-03 MED ORDER — PIPERACILLIN-TAZOBACTAM 3.375 G IVPB
3.3750 g | Freq: Three times a day (TID) | INTRAVENOUS | Status: AC
  Administered 2014-09-03 – 2014-09-09 (×21): 3.375 g via INTRAVENOUS
  Filled 2014-09-03 (×21): qty 50

## 2014-09-03 MED ORDER — DM-GUAIFENESIN ER 30-600 MG PO TB12
1.0000 | ORAL_TABLET | Freq: Two times a day (BID) | ORAL | Status: DC
  Administered 2014-09-04 – 2014-09-11 (×14): 1 via ORAL
  Filled 2014-09-03 (×18): qty 1

## 2014-09-03 MED ORDER — ALPRAZOLAM 0.25 MG PO TABS: 0.2500 mg | ORAL_TABLET | Freq: Two times a day (BID) | ORAL | Status: DC | PRN

## 2014-09-03 MED ORDER — VANCOMYCIN HCL 500 MG IV SOLR
500.0000 mg | INTRAVENOUS | Status: DC
  Administered 2014-09-03 – 2014-09-06 (×4): 500 mg via INTRAVENOUS
  Filled 2014-09-03 (×5): qty 500

## 2014-09-03 NOTE — Progress Notes (Signed)
CRITICAL VALUE ALERT  Critical value received:  PH (7.35), Pco2 (63.4), bicarbonate (34.1)  Date of notification:  09/03/2014  Time of notification:  1656  Critical value read back:Yes.    Nurse who received alert:  Lovie Macadamia RN  MD notified (1st page):  Dr Joseph Art (MD was on unit)  Time of first page:  1700  MD notified (2nd page):  Time of second page:  Responding MD:  Dr Joseph Art  Time MD responded:  [phone]

## 2014-09-03 NOTE — ED Notes (Signed)
Pt from [facility] for SOB, resp distress, and tachypnea in the 40's. Pt here via [facility]. 78 % on 2 liters and increased to 85 % on 5 liters [place]. Placed on NRB by EMS and then unable to obtain saturation in route. Had PNA earlier this month finished levaquin on 10th. On the 15th started getting sick. Unable to obtain IV access.

## 2014-09-03 NOTE — ED Provider Notes (Signed)
CSN: [id number]     Arrival date & time 09/03/14  0242 History   This chart was scribed for Loren Racer, MD by Arlan Organ, ED Scribe. This patient was seen in room D36C/D36C and the patient's care was started 2:49 AM.   Chief Complaint  Patient presents with  . Shortness of Breath  . Respiratory Distress   The history is provided by the EMS personnel. No language interpreter was used.     LEVEL 5 CAVEAT DUE TO RESPIRATORY DISTRESS  HPI Comments: [facility] brought in by EMS from nursing facility is a 81 y.o. female with a PMHx of HTN, DM, COPD, and CHF who presents to the Emergency Department here for respiratory distress this evening. Shortness of breath is made worse when she is laying down flat. No alleviating factors at this time. Per EMS, pt recently recovered from PNA 7/15 which was initially diagnosed on 7/1. Staff states pts current symptoms and presentation are similar to what she experienced with initial diagnosis of PNA. Patient denies chest pain.  Past Medical History  Diagnosis Date  . Depression   . Hypercholesteremia   . Hypertension   . Diabetes mellitus without complication   . COPD (chronic obstructive pulmonary disease)   . Renal disorder   . Chronic kidney disease   . CHF (congestive heart failure)   . Stroke     right deficits   Past Surgical History  Procedure Laterality Date  . Abdominal hysterectomy      partial   No family history on file. History  Substance Use Topics  . Smoking status: Former Smoker    Quit date: 07/06/2008  . Smokeless tobacco: Not on file  . Alcohol Use: No   OB History    No data available     Review of Systems  Unable to perform ROS: Severe respiratory distress  Respiratory: Positive for shortness of breath.   Cardiovascular: Negative for chest pain and leg swelling.  Gastrointestinal: Negative for nausea, vomiting and abdominal pain.      Allergies  Codeine; Doxazosin mesylate; Gemfibrozil;  Prednisone; and Rosiglitazone maleate  Home Medications   Prior to Admission medications   Medication Sig Start Date End Date Taking? Authorizing Provider  albuterol (PROVENTIL HFA;VENTOLIN HFA) 108 (90 BASE) MCG/ACT inhaler Inhale 2 puffs into the lungs every 6 (six) hours as needed for shortness of breath.   Yes Historical Provider, MD  ALPRAZolam (XANAX) 0.25 MG tablet Take 0.25 mg by mouth 2 (two) times daily as needed for anxiety.   Yes Historical Provider, MD  amLODipine (NORVASC) 10 MG tablet Take 10 mg by mouth daily.   Yes Historical Provider, MD  atorvastatin (LIPITOR) 10 MG tablet Take 10 mg by mouth daily.   Yes Historical Provider, MD  Cholecalciferol 1000 UNITS capsule Take 1,000 Units by mouth 2 (two) times daily.   Yes Historical Provider, MD  docusate sodium (COLACE) 100 MG capsule Take 100 mg by mouth 2 (two) times daily.   Yes Historical Provider, MD  DULoxetine (CYMBALTA) 60 MG capsule Take 60 mg by mouth daily.   Yes Historical Provider, MD  ferrous sulfate 325 (65 FE) MG tablet Take 325 mg by mouth 3 (three) times daily.   Yes Historical Provider, MD  fluticasone (FLONASE) 50 MCG/ACT nasal spray Place 2 sprays into both nostrils 2 (two) times daily.    Yes Historical Provider, MD  furosemide (LASIX) 20 MG tablet Take 20-40 mg by mouth 2 (two) times daily. Take   in the morning and 20mg  midday.   Yes Historical Provider, MD  glimepiride (AMARYL) 2 MG tablet Take 2 mg by mouth daily with breakfast.   Yes Historical Provider, MD  guaifenesin (ROBITUSSIN) 100 MG/5ML syrup Take 300 mg by mouth every 4 (four) hours as needed for cough.   Yes Historical Provider, MD  hydrALAZINE (APRESOLINE) 100 MG tablet Take 100 mg by mouth 3 (three) times daily.   Yes Historical Provider, MD  insulin detemir (LEVEMIR) 100 UNIT/ML injection Inject 8-10 Units into the skin See admin instructions. Take 8 units at bedtime. Also take 10 units in the AM   Yes Historical Provider, MD  insulin lispro  (HUMALOG) 100 UNIT/ML injection Inject 0-7 Units into the skin daily as needed for high blood sugar. 120=0 units 120-150=3 units 151-175=5 units >176=7 units   Yes Historical Provider, MD  ipratropium-albuterol (DUONEB) 0.5-2.5 (3) MG/3ML SOLN Take 3 mLs by nebulization every 6 (six) hours as needed. For shortness of breath   Yes Historical Provider, MD  lansoprazole (PREVACID) 30 MG capsule Take 30 mg by mouth daily at 12 noon.   Yes Historical Provider, MD  loratadine (CLARITIN) 10 MG tablet Take 10 mg by mouth daily as needed for allergies.   Yes Historical Provider, MD  mesalamine (LIALDA) 1.2 G EC tablet Take 2.4 g by mouth every evening.   Yes Historical Provider, MD  Omega-3 Fatty Acids (FISH OIL) 1000 MG CAPS Take 1 capsule by mouth 2 (two) times daily.   Yes Historical Provider, MD  phenol (CHLORASEPTIC) 1.4 % LIQD Use as directed 1 spray in the mouth or throat every 2 (two) hours as needed for throat irritation / pain.   Yes Historical Provider, MD  potassium chloride (MICRO-K) 10 MEQ CR capsule Take 20 mEq by mouth daily.    Yes Historical Provider, MD  Propylene Glycol (SYSTANE BALANCE) 0.6 % SOLN Place 1 drop into both eyes 2 (two) times daily.   Yes Historical Provider, MD  sennosides-docusate sodium (SENOKOT-S) 8.6-50 MG tablet Take 1 tablet by mouth 2 (two) times daily.   Yes Historical Provider, MD  traMADol (ULTRAM) 50 MG tablet Take 50 mg by mouth 2 (two) times daily as needed for moderate pain.   Yes Historical Provider, MD  vitamin B-12 (CYANOCOBALAMIN) 500 MCG tablet Take 500 mcg by mouth daily.   Yes Historical Provider, MD   Triage Vitals: BP 122/45 mmHg  Pulse 85  Temp(Src) 97.4 F (36.3 C) (Axillary)  Resp 20  SpO2 99%   Physical Exam  Constitutional: She is oriented to person, place, and time. She appears well-developed and well-nourished. She appears distressed.  HENT:  Head: Normocephalic and atraumatic.  Mouth/Throat: Oropharynx is clear and moist.  Eyes: EOM  are normal. Pupils are equal, round, and reactive to light.  Neck: Normal range of motion. Neck supple. JVD present.  Cardiovascular: Normal rate and regular rhythm.   Pulmonary/Chest: No stridor. No respiratory distress. She has no wheezes. She has rales (crackles in bilateral bases left greater than right.).  Increased respiratory effort with tachypnea and accessory muscle use.  Abdominal: Soft. Bowel sounds are normal. She exhibits no distension and no mass. There is no tenderness. There is no rebound and no guarding.  Musculoskeletal: Normal range of motion. She exhibits no edema or tenderness.  No lower extremity swelling or tenderness.  Neurological: She is alert and oriented to person, place, and time.  Right-sided weakness chronic is nature  Skin: Skin is warm and dry. No  rash noted. No erythema.  Psychiatric: She has a normal mood and affect. Her behavior is normal.  Nursing note and vitals reviewed.   ED Course  Procedures (including critical care time)  DIAGNOSTIC STUDIES: Oxygen Saturation is 96% on NRB, low by my interpretation.    COORDINATION OF CARE: 2:55 AM- Will order CXR, CBC, BNP, CMP, EKG, Troponin I, and urinalysis. Discussed treatment plan with pt at bedside and pt agreed to plan.  a   Labs Review Labs Reviewed  CBC WITH DIFFERENTIAL/PLATELET - Abnormal; Notable for the following:    WBC 14.2 (*)    RBC 3.36 (*)    Hemoglobin 11.2 (*)    HCT 35.3 (*)    MCV 105.1 (*)    Neutrophils Relative % 88 (*)    Neutro Abs 12.5 (*)    Lymphocytes Relative 5 (*)    Lymphs Abs 0.6 (*)    All other components within normal limits  BRAIN NATRIURETIC PEPTIDE - Abnormal; Notable for the following:    B Natriuretic Peptide 377.8 (*)    All other components within normal limits  COMPREHENSIVE METABOLIC PANEL - Abnormal; Notable for the following:    Potassium 6.3 (*)    Chloride 100 (*)    Glucose, Bld 173 (*)    BUN 42 (*)    Creatinine, Ser 1.52 (*)    Total  Protein 6.0 (*)    Albumin 3.0 (*)    AST 14 (*)    ALT 12 (*)    GFR calc non Af Amer 31 (*)    GFR calc Af Amer 36 (*)    All other components within normal limits  URINALYSIS, ROUTINE W REFLEX MICROSCOPIC (NOT AT [facility]) - Abnormal; Notable for the following:    APPearance CLOUDY (*)    Protein, ur 100 (*)    Leukocytes, UA SMALL (*)    All other components within normal limits  POTASSIUM - Abnormal; Notable for the following:    Potassium 6.0 (*)    All other components within normal limits  URINE MICROSCOPIC-ADD ON - Abnormal; Notable for the following:    Bacteria, UA MANY (*)    All other components within normal limits  POTASSIUM - Abnormal; Notable for the following:    Potassium 5.6 (*)    All other components within normal limits  BLOOD GAS, ARTERIAL - Abnormal; Notable for the following:    pH, Arterial 7.346 (*)    pCO2 arterial 61.6 (*)    pO2, Arterial 101 (*)    Bicarbonate 32.8 (*)    Acid-Base Excess 7.2 (*)    All other components within normal limits  GLUCOSE, CAPILLARY - Abnormal; Notable for the following:    Glucose-Capillary 180 (*)    All other components within normal limits  GLUCOSE, CAPILLARY - Abnormal; Notable for the following:    Glucose-Capillary 160 (*)    All other components within normal limits  BASIC METABOLIC PANEL - Abnormal; Notable for the following:    Sodium 146 (*)    Potassium 5.3 (*)    Glucose, Bld 188 (*)    BUN 41 (*)    Creatinine, Ser 1.42 (*)    GFR calc non Af Amer 33 (*)    GFR calc Af Amer 39 (*)    All other components within normal limits  PROTEIN / CREATININE RATIO, URINE - Abnormal; Notable for the following:    Protein Creatinine Ratio 0.63 (*)    All other components within  normal limits  BASIC METABOLIC PANEL - Abnormal; Notable for the following:    Potassium 3.4 (*)    Chloride 98 (*)    CO2 34 (*)    Glucose, Bld 151 (*)    BUN 38 (*)    Creatinine, Ser 1.52 (*)    GFR calc non Af Amer 31 (*)    GFR  calc Af Amer 36 (*)    All other components within normal limits  BLOOD GAS, ARTERIAL - Abnormal; Notable for the following:    pCO2 arterial 63.4 (*)    Bicarbonate 34.1 (*)    Acid-Base Excess 8.4 (*)    All other components within normal limits  GLUCOSE, CAPILLARY - Abnormal; Notable for the following:    Glucose-Capillary 163 (*)    All other components within normal limits  GLUCOSE, CAPILLARY - Abnormal; Notable for the following:    Glucose-Capillary 119 (*)    All other components within normal limits  I-STAT ARTERIAL BLOOD GAS, ED - Abnormal; Notable for the following:    pH, Arterial 7.320 (*)    pCO2 arterial 58.9 (*)    pO2, Arterial 75.0 (*)    Bicarbonate 30.4 (*)    Acid-Base Excess 3.0 (*)    All other components within normal limits  CBG MONITORING, ED - Abnormal; Notable for the following:    Glucose-Capillary 239 (*)    All other components within normal limits  MRSA PCR SCREENING  CULTURE, EXPECTORATED SPUTUM-ASSESSMENT  CULTURE, BLOOD (ROUTINE X 2)  CULTURE, BLOOD (ROUTINE X 2)  URINE CULTURE  TROPONIN I  MAGNESIUM  LIPID PANEL  STREP PNEUMONIAE URINARY ANTIGEN  TROPONIN I  LACTIC ACID, PLASMA  LACTIC ACID, PLASMA  CREATININE, URINE, 24 HOUR  SODIUM, URINE, RANDOM  LACTIC ACID, PLASMA  MAGNESIUM  HEMOGLOBIN A1C  LEGIONELLA ANTIGEN, URINE  HIV ANTIBODY (ROUTINE TESTING)  PROTEIN ELECTROPHORESIS, SERUM  IMMUNOFIXATION ELECTROPHORESIS, URINE (WITH TOT PROT)  COMPREHENSIVE METABOLIC PANEL  CBC WITH DIFFERENTIAL/PLATELET  MAGNESIUM  BLOOD GAS, ARTERIAL  I-STAT CG4 LACTIC ACID, ED    Imaging Review Ct Head Wo Contrast  09/03/2014   CLINICAL DATA:  Altered mental status  EXAM: CT HEAD WITHOUT CONTRAST  TECHNIQUE: Contiguous axial images were obtained from the base of the skull through the vertex without intravenous contrast.  COMPARISON:  Head CT August 24, 2008; brain MRI August 24, 2008  FINDINGS: Moderate diffuse atrophy is stable. There is no  intracranial mass, hemorrhage, extra-axial fluid collection, or midline shift. There is small vessel disease throughout the centra semiovale bilaterally, most pronounced superiorly and posteriorly on the left. There is evidence of a prior infarct involving the tail of the caudate nucleus on the left as well as a portion of the left thalamus. There is decreased attenuation in the right thalamus which was not present previously and is consistent with an age uncertain infarct. There is also decreased attenuation in the inferior cerebellum on the right slightly inferior to the right dentate nucleus. This area has an appearance concerning for recent infarct. Bony calvarium appears intact. Mastoid air cells are clear.  IMPRESSION: Atrophy with multiple areas of prior infarct. Suspect recent infarct in the right cerebellum slightly inferior to the right dentate nucleus. Age uncertain infarct in the right thalamus. Chronic infarct left thalamus as well as in the tail of the caudate nucleus on the left. Extensive periventricular small vessel disease present. No hemorrhage or mass effect.   Electronically Signed   By: Bretta Bang III M.D.  On: 09/03/2014 16:08   Dg Chest Port 1 View  09/03/2014   CLINICAL DATA:  Acute onset of shortness of breath. Initial encounter.  EXAM: PORTABLE CHEST - 1 VIEW  COMPARISON:  Chest radiograph performed 08/07/2014  FINDINGS: The lungs are hypoexpanded. Bibasilar airspace opacity could reflect pneumonia. No pleural effusion or pneumothorax is seen.  The cardiomediastinal silhouette is mildly enlarged. No acute osseous abnormalities are identified.  IMPRESSION: Lungs hypoexpanded. Bibasilar airspace opacities could reflect pneumonia. Mild cardiomegaly.   Electronically Signed   By: Roanna Raider M.D.   On: 09/03/2014 04:33     EKG Interpretation   Date/Time:  Sunday September 03 2014 02:58:08 EDT Ventricular Rate:  72 PR Interval:  143 QRS Duration: 84 QT Interval:  397 QTC  Calculation: 434 R Axis:   12 Text Interpretation:  Sinus rhythm Probable left atrial enlargement Left  ventricular hypertrophy Confirmed by Ranae Palms  MD, Ahmaud Duthie ([id number]) on  09/03/2014 3:13:44 AM Also confirmed by Ranae Palms  MD, Daine Gunther ([id number])  on  09/03/2014 4:12:14 AM     CRITICAL CARE Performed by: Ranae Palms, Jansen Goodpasture Total critical care time: 30 min Critical care time was exclusive of separately billable procedures and treating other patients. Critical care was necessary to treat or prevent imminent or life-threatening deterioration. Critical care was time spent personally by me on the following activities: development of treatment plan with patient and/or surrogate as well as nursing, discussions with consultants, evaluation of patient's response to treatment, examination of patient, obtaining history from patient or surrogate, ordering and performing treatments and interventions, ordering and review of laboratory studies, ordering and review of radiographic studies, pulse oximetry and re-evaluation of patient's condition.  MDM   Final diagnoses:  SOB (shortness of breath)  Hyperkalemia  CHF exacerbation    I personally performed the services described in this documentation, which was scribed in my presence. The recorded information has been reviewed and is accurate.  Patient placed on BiPAP with improvement of her respiratory distress. EKG with peaked T waves. Laboratory workup shows elevated potassium. Treated with calcium, insulin and glucose. Discussed with nephrology on-call, Dr. Missy Sabins. Recommended repeat potassium and giving Kayexalate. Does not believe the patient needs to be dialyzed at this time. Discussed with [facility] and will admit to step down bed.  Patient's clinical exam most consistent with pulmonary edema. Mildly elevated white blood cell count. Normal lactic acid.  Loren Racer, MD 09/04/14 [phone]

## 2014-09-03 NOTE — Progress Notes (Addendum)
[facility] TEAM 1 - Stepdown/ICU TEAM Progress Note  [facility] WUJ:[id number] DOB: [birth year] DOA: 09/03/2014 PCP: Julian Hy, MD  Admit HPI / Brief Narrative: 81 y.o. WF PMHx Depression, CVA, HTN, CHF DM type II, COPD, presenting with cc of dyspnea. Patient is currently on Bipap and can't provide history. No family at bedside ; she is a resident at [facility]. History taking from ER physician. She reported orthopnea. She had a pneumonia three weeks ago and she reported that her symptoms now are similar to her symptoms from 3 weeks ago. No chest pain.   HPI/Subjective: 7/24  A/O 0, patient only barely withdraws to needle sticks during lab draws. Patient does moan during these lab draws, does not spontaneously open her eyes follows no commands. Daughter states is on O2 at [facility] (unknown amount). States ~29 June began to have increased altered mental status secondary to pneumonia. Was treated with a course of antibiotics recovered but not fully, and then again began to have increased altered mental status, dysphagia, and increased WOB/SOB. Patient is a 1 PPD 50 years smoker (stopped July 2010).    Assessment/Plan: Acute diastolic HF:  -10/24 PCXR; bibasilar opacification see results below. -Elevated BNP possibly secondary to AKI.  -Given 160 mg IV lasix in the ER after consulting with Nephro. Will monitor output -Not on BB or ACEI -Echocardiogram pending  -Troponin trending; first set negative  Sepsis due to HCAP?/Aspiration pneumonia?  -Continue Vancomycin and Zosyn  -Lactic Acid within normal limit  -Strep pneumo urine antigen, Legionella urine antigen pending -Sputum pending -DuoNeb QID -Mucinex DM BID  Acute respiratory failure with Hypercapnia   -Likely multifactorial due to Pneumonia vs. Acute HF vs. COPD exacerbation  -Continue  BiPAP and oxygen therapy PRN  -Will r/o ACS with serial trops and EKG -Continue albuterol Q4H -ABG at 1600; shows worsening  PCO2---> patient requires intubation. Have spoken with PCCM and they concur  Hyperkalemia:  -Iatrogenic ? -Given lasix IV in the ED and Ca gluconate in the ED -7/24 Repeat potassium 5.6 and magnesium 2.3; --> Kayexalate 45 g rectal; repeat K= 5.3--> Kayexalate 60 gm  DM Type II controlled? -Hemoglobin A1c pending -Lipid panel pending -Increase to moderate SSI  Acute on chronic kidney failure (Cr Baseline ~ 1.3).  -Due to HF vs. Sepsis -S/p lasix per Nephro recs to ER.  -Nephro to see patient in am.  Altered mental status -Will hold off on all sedating medication, DC Xanax -Patient's mental status appears to be worsening. Head CT shows acute on chronic stroke   Acute stroke on chronic strokes -7/24 CT head shows probable acute stroke with multiple chronic stroke. See results below -Allow for permissive HTN -After patient is intubated and stabilized will require completion of stroke workup    Code Status: FULL Family Communication: Spoke with [facility] (daughter) Avon Gully [phone] and she would like to have her mother intubated at this time, to see if it would help.  Disposition Plan:     Consultants: Dr. Pasty Arch (PCCM)    Procedure/Significant Events: 7/24 PCXR; poor respiratory effort resulted in hyperexpansion, bibasilar airspace opacification to my: Pneumonia? 7/24 CT head without contrast; Atrophy with multiple areas of prior infarct.  -Suspect recent infarct Rt cerebellum slightly inferior to the right dentate nucleus.  -Age uncertain infarct Rt thalamus. -Chronic infarct Lt thalamus as well as in the tail of the caudate nucleus     Culture 7/24 MRSA by PCR negative 7/24 blood pending 7/24 urine pending  Antibiotics: Zosyn 7/24>> Vancomycin 7/24>>   DVT prophylaxis: Lovenox   Devices    LINES / TUBES:      Continuous Infusions: . dextrose 5 % and 0.45% NaCl      Objective: VITAL SIGNS: Temp: 98 F (36.7 C) (07/24 1658) Temp  Source: Axillary (07/24 1658) BP: 158/48 mmHg (07/24 1658) Pulse Rate: 95 (07/24 1658) SPO2; FIO2:   Intake/Output Summary (Last 24 hours) at 09/03/14 1800 Last data filed at 09/03/14 0703  Gross per 24 hour  Intake      0 ml  Output    400 ml  Net   -400 ml     Exam: General:A/O 0, patient only barely withdraws to needle sticks during lab draws. Patient does moan during these lab draws, does not spontaneously open her eyes follows no commands, positive acute respiratory distress Eyes: Unable to assess  ENT: negative gingival bleeding Neck:  Negative scars, masses, torticollis, lymphadenopathy, JVD Lungs: poor inspiratory effort, decreased breath sounds lateral, diffuse expiratory wheezing.  Cardiovascular: Regular rate and rhythm without murmur gallop or rub normal S1 and S2 Abdomen: Unresponsive to painful stimuli, soft bowel sounds positive, Extremities: No significant cyanosis, clubbing, or edema bilateral lower extremities Psychiatric:  Unable to assess  Neurologic:  Unable to assess; patient would moan slightly with needle sticks.    Data Reviewed: Basic Metabolic Panel:  Recent Labs Lab 09/03/14 0309 09/03/14 0533 09/03/14 0918 09/03/14 1330  NA 138  --   --  146*  K 6.3* 6.0* 5.6* 5.3*  CL 100*  --   --  106  CO2 29  --   --  31  GLUCOSE 173*  --   --  188*  BUN 42*  --   --  41*  CREATININE 1.52*  --   --  1.42*  CALCIUM 9.3  --   --  9.8  MG  --   --  2.3  --    Liver Function Tests:  Recent Labs Lab 09/03/14 0309  AST 14*  ALT 12*  ALKPHOS 73  BILITOT 0.5  PROT 6.0*  ALBUMIN 3.0*   No results for input(s): LIPASE, AMYLASE in the last 168 hours. No results for input(s): AMMONIA in the last 168 hours. CBC:  Recent Labs Lab 09/03/14 0309  WBC 14.2*  NEUTROABS 12.5*  HGB 11.2*  HCT 35.3*  MCV 105.1*  PLT 265   Cardiac Enzymes:  Recent Labs Lab 09/03/14 0309 09/03/14 1055  TROPONINI 0.03 0.03   BNP (last 3 results)  Recent  Labs  09/03/14 0309  BNP 377.8*    ProBNP (last 3 results) No results for input(s): PROBNP in the last 8760 hours.  CBG:  Recent Labs Lab 09/03/14 0600 09/03/14 0834 09/03/14 1255 09/03/14 1654  GLUCAP 239* 180* 160* 163*    Recent Results (from the past 240 hour(s))  MRSA PCR Screening     Status: None   Collection Time: 09/03/14  8:41 AM  Result Value Ref Range Status   MRSA by PCR NEGATIVE NEGATIVE Final    Comment:        The GeneXpert MRSA Assay (FDA approved for NASAL specimens only), is one component of a comprehensive MRSA colonization surveillance program. It is not intended to diagnose MRSA infection nor to guide or monitor treatment for MRSA infections.      Studies:  Recent x-ray studies have been reviewed in detail by the Attending Physician  Scheduled Meds:  Scheduled Meds: . amLODipine  10 mg  Oral Daily  . atorvastatin  10 mg Oral Daily  . dextromethorphan-guaiFENesin  1 tablet Oral BID  . docusate sodium  100 mg Oral BID  . enoxaparin (LOVENOX) injection  40 mg Subcutaneous Q24H  . hydrALAZINE  100 mg Oral 3 times per day  . insulin aspart  0-9 Units Subcutaneous TID WC  . ipratropium-albuterol  3 mL Nebulization Q6H  . piperacillin-tazobactam (ZOSYN)  IV  3.375 g Intravenous 3 times per day  . sodium polystyrene  60 g Rectal Once  . vancomycin  500 mg Intravenous Q24H    Time spent on care of this patient: 40 mins   Jamil Castillo, Roselind Messier , MD  [facility] Office  [phone] Pager [phone]  On-Call/Text Page:      [email or web address]      password [email or web address]  If 7PM-7AM, please contact night-coverage [email or web address] Password [email or web address] 09/03/2014, 6:00 PM   LOS: 0 days   Care during the described time interval was provided by me .  I have reviewed this patient's available data, including medical history, events of note, physical examination, and all test results as part of my evaluation. I have personally reviewed and interpreted all  radiology studies.   Carolyne Littles, MD [phone] Pager

## 2014-09-03 NOTE — Progress Notes (Signed)
ANTIBIOTIC CONSULT NOTE - INITIAL  Pharmacy Consult for Vancomycin per Rx (Zosyn per MD) Indication: rule out pneumonia  Allergies  Allergen Reactions  . Codeine     unknown  . Doxazosin Mesylate     unknown  . Gemfibrozil     unknown  . Prednisone     REACTION: sensitivity  . Rosiglitazone Maleate     REACTION: sensitivity    Vital Signs: BP: 166/42 mmHg (07/24 0307) Pulse Rate: 71 (07/24 0307)  Labs:  Recent Labs  09/03/14 0309  WBC 14.2*  HGB 11.2*  PLT 265  CREATININE 1.52*    Medical History: Past Medical History  Diagnosis Date  . Depression   . Hypercholesteremia   . Hypertension   . Diabetes mellitus without complication   . COPD (chronic obstructive pulmonary disease)   . Renal disorder   . Chronic kidney disease   . CHF (congestive heart failure)   . Stroke     right deficits    Assessment: Broad spectrum anti-biotics for r/o PNA. WBC mildly elevated. Noted renal dysfunction.   Goal of Therapy:  Vancomycin trough level 15-20 mcg/ml  Plan:  -Vancomycin 500 mg IV q24h -Zosyn 3.375G IV q8h to be infused over 4 hours -Trend WBC, temp, renal function  -Drug levels as indicated   Abran Duke 09/03/2014,5:44 AM

## 2014-09-03 NOTE — Consult Note (Signed)
Reason for Consult: hyperkalemia Referring Physician: ED provider  Michelle Bowers is an 81 y.o. female.  HPI: 81 y/o F w/ PMHx of HTN, DM, COPD on 2L home O2, and CHF. who presented to the ED w/ shortness of breath and orthopnea. Pt is on BIPAP and non responsive to sternal rub and painful stimulation to nail bed, thus history obtained per chart review and telephone conversation with [facility]. Pt was in her usual state of health until last night when she developed SOB. She was then taken to the ED with sx of SOB, tachypnea, and orthopnea. She was hypoxic satting at 85% on 5L [place] and placed on a non rebreather by EMS. She was recently dx w/ pneumonia on 7/1 and completed course of levaquin on 7/10. She has an extensive >50 year smoking history and is on home O2, per family she is schedule for a CT of her chest. Nursing home facility states she has good cognition and do not believe pt had any urinary issues. She is on ultram BID prn but has not been taking it. Review of medications with nursing staff reveals that she has been taking lasix 23m BID and KCl 178mdaily appropriately. In the ED her potassium was 6.3 and creatinine 1.52. She was given calcium gluconate, D50, insulin, and kayexalate 60 grams. She was also given lasix 16019mV. Nephrology was consulted for hyperkalemia.    Past Medical History  Diagnosis Date  . Depression   . Hypercholesteremia   . Hypertension   . Diabetes mellitus without complication   . COPD (chronic obstructive pulmonary disease)   . Renal disorder   . Chronic kidney disease   . CHF (congestive heart failure)   . Stroke     right deficits    Past Surgical History  Procedure Laterality Date  . Abdominal hysterectomy      partial    No family history on file.  Social History:  reports that she quit smoking about 6 years ago. She does not have any smokeless tobacco history on file. She reports that she does not drink alcohol or use illicit  drugs.  Allergies:  Allergies  Allergen Reactions  . Codeine     unknown  . Doxazosin Mesylate     unknown  . Gemfibrozil     unknown  . Prednisone     REACTION: sensitivity  . Rosiglitazone Maleate     REACTION: sensitivity    Medications:  Prior to Admission:  Prescriptions prior to admission  Medication Sig Dispense Refill Last Dose  . albuterol (PROVENTIL HFA;VENTOLIN HFA) 108 (90 BASE) MCG/ACT inhaler Inhale 2 puffs into the lungs every 6 (six) hours as needed for shortness of breath.   09/03/2014 at Unknown time  . ALPRAZolam (XANAX) 0.25 MG tablet Take 0.25 mg by mouth 2 (two) times daily as needed for anxiety.   09/02/2014 at Unknown time  . amLODipine (NORVASC) 10 MG tablet Take 10 mg by mouth daily.   09/02/2014 at Unknown time  . atorvastatin (LIPITOR) 10 MG tablet Take 10 mg by mouth daily.   09/02/2014 at Unknown time  . Cholecalciferol 1000 UNITS capsule Take 1,000 Units by mouth 2 (two) times daily.   09/02/2014 at Unknown time  . docusate sodium (COLACE) 100 MG capsule Take 100 mg by mouth 2 (two) times daily.   09/02/2014 at Unknown time  . DULoxetine (CYMBALTA) 60 MG capsule Take 60 mg by mouth daily.   09/02/2014 at Unknown time  .  ferrous sulfate 325 (65 FE) MG tablet Take 325 mg by mouth 3 (three) times daily.   09/02/2014 at Unknown time  . fluticasone (FLONASE) 50 MCG/ACT nasal spray Place 2 sprays into both nostrils 2 (two) times daily.    09/02/2014 at Unknown time  . furosemide (LASIX) 20 MG tablet Take 20-40 mg by mouth 2 (two) times daily. Take 60m in the morning and 259mmidday.   09/02/2014 at Unknown time  . glimepiride (AMARYL) 2 MG tablet Take 2 mg by mouth daily with breakfast.   09/02/2014 at Unknown time  . guaifenesin (ROBITUSSIN) 100 MG/5ML syrup Take 300 mg by mouth every 4 (four) hours as needed for cough.   unk  . hydrALAZINE (APRESOLINE) 100 MG tablet Take 100 mg by mouth 3 (three) times daily.   09/02/2014 at Unknown time  . insulin detemir (LEVEMIR)  100 UNIT/ML injection Inject 8-10 Units into the skin See admin instructions. Take 8 units at bedtime. Also take 10 units in the AM   09/02/2014 at Unknown time  . insulin lispro (HUMALOG) 100 UNIT/ML injection Inject 0-7 Units into the skin daily as needed for high blood sugar. 120=0 units 120-150=3 units 151-175=5 units >176=7 units   09/02/2014 at Unknown time  . ipratropium-albuterol (DUONEB) 0.5-2.5 (3) MG/3ML SOLN Take 3 mLs by nebulization every 6 (six) hours as needed. For shortness of breath   09/02/2014 at Unknown time  . lansoprazole (PREVACID) 30 MG capsule Take 30 mg by mouth daily at 12 noon.   09/03/2014 at Unknown time  . loratadine (CLARITIN) 10 MG tablet Take 10 mg by mouth daily as needed for allergies.   unk  . mesalamine (LIALDA) 1.2 G EC tablet Take 2.4 g by mouth every evening.   09/02/2014 at Unknown time  . Omega-3 Fatty Acids (FISH OIL) 1000 MG CAPS Take 1 capsule by mouth 2 (two) times daily.   09/02/2014 at Unknown time  . phenol (CHLORASEPTIC) 1.4 % LIQD Use as directed 1 spray in the mouth or throat every 2 (two) hours as needed for throat irritation / pain.   unk  . potassium chloride (MICRO-K) 10 MEQ CR capsule Take 20 mEq by mouth daily.    09/02/2014 at Unknown time  . Propylene Glycol (SYSTANE BALANCE) 0.6 % SOLN Place 1 drop into both eyes 2 (two) times daily.   09/02/2014 at Unknown time  . sennosides-docusate sodium (SENOKOT-S) 8.6-50 MG tablet Take 1 tablet by mouth 2 (two) times daily.   09/02/2014 at Unknown time  . traMADol (ULTRAM) 50 MG tablet Take 50 mg by mouth 2 (two) times daily as needed for moderate pain.   09/02/2014 at Unknown time  . vitamin B-12 (CYANOCOBALAMIN) 500 MCG tablet Take 500 mcg by mouth daily.   09/02/2014 at Unknown time   Scheduled: . amLODipine  10 mg Oral Daily  . atorvastatin  10 mg Oral Daily  . dextromethorphan-guaiFENesin  1 tablet Oral BID  . docusate sodium  100 mg Oral BID  . enoxaparin (LOVENOX) injection  40 mg Subcutaneous  Q24H  . hydrALAZINE  100 mg Oral 3 times per day  . insulin aspart  0-9 Units Subcutaneous TID WC  . ipratropium-albuterol  3 mL Nebulization Q6H  . piperacillin-tazobactam (ZOSYN)  IV  3.375 g Intravenous 3 times per day  . vancomycin  500 mg Intravenous Q24H     Results for orders placed or performed during the hospital encounter of 09/03/14 (from the past 48 hour(s))  CBC with  Differential/Platelet     Status: Abnormal   Collection Time: 09/03/14  3:09 AM  Result Value Ref Range   WBC 14.2 (H) 4.0 - 10.5 K/uL   RBC 3.36 (L) 3.87 - 5.11 MIL/uL   Hemoglobin 11.2 (L) 12.0 - 15.0 g/dL   HCT 35.3 (L) 36.0 - 46.0 %   MCV 105.1 (H) 78.0 - 100.0 fL   MCH 33.3 26.0 - 34.0 pg   MCHC 31.7 30.0 - 36.0 g/dL   RDW 14.3 11.5 - 15.5 %   Platelets 265 150 - 400 K/uL   Neutrophils Relative % 88 (H) 43 - 77 %   Neutro Abs 12.5 (H) 1.7 - 7.7 K/uL   Lymphocytes Relative 5 (L) 12 - 46 %   Lymphs Abs 0.6 (L) 0.7 - 4.0 K/uL   Monocytes Relative 6 3 - 12 %   Monocytes Absolute 0.9 0.1 - 1.0 K/uL   Eosinophils Relative 1 0 - 5 %   Eosinophils Absolute 0.2 0.0 - 0.7 K/uL   Basophils Relative 0 0 - 1 %   Basophils Absolute 0.0 0.0 - 0.1 K/uL  Brain natriuretic peptide     Status: Abnormal   Collection Time: 09/03/14  3:09 AM  Result Value Ref Range   B Natriuretic Peptide 377.8 (H) 0.0 - 100.0 pg/mL  Comprehensive metabolic panel     Status: Abnormal   Collection Time: 09/03/14  3:09 AM  Result Value Ref Range   Sodium 138 135 - 145 mmol/L   Potassium 6.3 (HH) 3.5 - 5.1 mmol/L    Comment: NO VISIBLE HEMOLYSIS REPEATED TO VERIFY CRITICAL RESULT CALLED TO, READ BACK BY AND VERIFIED WITH: HINSON D,RN 09/03/14 0357 WAYK    Chloride 100 (L) 101 - 111 mmol/L   CO2 29 22 - 32 mmol/L   Glucose, Bld 173 (H) 65 - 99 mg/dL   BUN 42 (H) 6 - 20 mg/dL   Creatinine, Ser 1.52 (H) 0.44 - 1.00 mg/dL   Calcium 9.3 8.9 - 10.3 mg/dL   Total Protein 6.0 (L) 6.5 - 8.1 g/dL   Albumin 3.0 (L) 3.5 - 5.0 g/dL    AST 14 (L) 15 - 41 U/L   ALT 12 (L) 14 - 54 U/L   Alkaline Phosphatase 73 38 - 126 U/L   Total Bilirubin 0.5 0.3 - 1.2 mg/dL   GFR calc non Af Amer 31 (L) >60 mL/min   GFR calc Af Amer 36 (L) >60 mL/min    Comment: (NOTE) The eGFR has been calculated using the CKD EPI equation. This calculation has not been validated in all clinical situations. eGFR's persistently <60 mL/min signify possible Chronic Kidney Disease.    Anion gap 9 5 - 15  Troponin I     Status: None   Collection Time: 09/03/14  3:09 AM  Result Value Ref Range   Troponin I 0.03 <0.031 ng/mL    Comment:        NO INDICATION OF MYOCARDIAL INJURY.   I-Stat arterial blood gas, ED     Status: Abnormal   Collection Time: 09/03/14  3:25 AM  Result Value Ref Range   pH, Arterial 7.320 (L) 7.350 - 7.450   pCO2 arterial 58.9 (HH) 35.0 - 45.0 mmHg   pO2, Arterial 75.0 (L) 80.0 - 100.0 mmHg   Bicarbonate 30.4 (H) 20.0 - 24.0 mEq/L   TCO2 32 0 - 100 mmol/L   O2 Saturation 93.0 %   Acid-Base Excess 3.0 (H) 0.0 - 2.0 mmol/L  Sample type ARTERIAL    Comment NOTIFIED PHYSICIAN   Potassium     Status: Abnormal   Collection Time: 09/03/14  5:33 AM  Result Value Ref Range   Potassium 6.0 (H) 3.5 - 5.1 mmol/L  I-Stat CG4 Lactic Acid, ED     Status: None   Collection Time: 09/03/14  5:40 AM  Result Value Ref Range   Lactic Acid, Venous 0.75 0.5 - 2.0 mmol/L  CBG monitoring, ED     Status: Abnormal   Collection Time: 09/03/14  6:00 AM  Result Value Ref Range   Glucose-Capillary 239 (H) 65 - 99 mg/dL  Urinalysis, Routine w reflex microscopic (not at [facility])     Status: Abnormal   Collection Time: 09/03/14  6:03 AM  Result Value Ref Range   Color, Urine YELLOW YELLOW   APPearance CLOUDY (A) CLEAR   Specific Gravity, Urine 1.016 1.005 - 1.030   pH 5.0 5.0 - 8.0   Glucose, UA NEGATIVE NEGATIVE mg/dL   Hgb urine dipstick NEGATIVE NEGATIVE   Bilirubin Urine NEGATIVE NEGATIVE   Ketones, ur NEGATIVE NEGATIVE mg/dL   Protein, ur  100 (A) NEGATIVE mg/dL   Urobilinogen, UA 0.2 0.0 - 1.0 mg/dL   Nitrite NEGATIVE NEGATIVE   Leukocytes, UA SMALL (A) NEGATIVE  Urine microscopic-add on     Status: Abnormal   Collection Time: 09/03/14  6:03 AM  Result Value Ref Range   Squamous Epithelial / LPF RARE RARE   WBC, UA 3-6 <3 WBC/hpf   RBC / HPF 0-2 <3 RBC/hpf   Bacteria, UA MANY (A) RARE  MRSA PCR Screening     Status: None   Collection Time: 09/03/14  8:41 AM  Result Value Ref Range   MRSA by PCR NEGATIVE NEGATIVE    Comment:        The GeneXpert MRSA Assay (FDA approved for NASAL specimens only), is one component of a comprehensive MRSA colonization surveillance program. It is not intended to diagnose MRSA infection nor to guide or monitor treatment for MRSA infections.   Potassium     Status: Abnormal   Collection Time: 09/03/14  9:18 AM  Result Value Ref Range   Potassium 5.6 (H) 3.5 - 5.1 mmol/L  Magnesium     Status: None   Collection Time: 09/03/14  9:18 AM  Result Value Ref Range   Magnesium 2.3 1.7 - 2.4 mg/dL    Dg Chest Port 1 View  09/03/2014   CLINICAL DATA:  Acute onset of shortness of breath. Initial encounter.  EXAM: PORTABLE CHEST - 1 VIEW  COMPARISON:  Chest radiograph performed 08/07/2014  FINDINGS: The lungs are hypoexpanded. Bibasilar airspace opacity could reflect pneumonia. No pleural effusion or pneumothorax is seen.  The cardiomediastinal silhouette is mildly enlarged. No acute osseous abnormalities are identified.  IMPRESSION: Lungs hypoexpanded. Bibasilar airspace opacities could reflect pneumonia. Mild cardiomegaly.   Electronically Signed   By: Garald Balding M.D.   On: 09/03/2014 04:33    Review of Systems  Unable to perform ROS: patient unresponsive   Blood pressure 161/60, pulse 99, temperature 97.9 F (36.6 C), temperature source Oral, resp. rate 20, SpO2 100 %. Physical Exam  Constitutional: She appears well-developed and well-nourished.  HENT:  BIPAP   Cardiovascular:  Normal rate and regular rhythm.   No murmur heard. Respiratory: Effort normal.  Rhonchi   GI: Soft. Bowel sounds are normal. She exhibits no distension. There is no tenderness.  Musculoskeletal: She exhibits no edema.  Skin: Skin is  warm and dry.     Assessment/Plan: 1 Acute on chronic kidney disease stage 3- creatinine 1.52, baseline around 0.7-0.8. Likely pre renal in setting of volume depletion as she appears dry on exam. Pt is not acidemic which is concerning for her as she has COPD and you would expect her to have a higher level. ABG reveals an acute on chronic respiratory acidosis. She may also have a mild RTA resulting in her hyperkalemia.  - NS at 100cc/hr - FENa - urine protein/cr  - renal u/s - SPEP/UPEP - strict I/Os  Will eval cKD, ie U/s, SPEP, UPEP,   2 Hyperkalemia- Received 60gram kayexalate per primary, receiving another 45gram today. Suspect exogenous K, mild MA (mixed disorder), and AKI with vol depletion.    3.. Respiratory distress- PNA vs CHF vs COPD exacerbation. On vanc and zosyn. On BIPAP, would hold all sedating meds including xanax and cymbalta.  5. HTN 4. Abnormal chest xray- on review, pt has poor inspiration w/ elevated rt hemidiaphragm concerning for rt lower lung collapse from questionable malignancy or mucous plugging. Per family she is scheduled to have a CT chest in the future.  6. DM   Julious Oka 09/03/2014, 11:43 AM I have seen and examined this patient and agree with the plan of care seen, examined,eval,discussed with resident and Dr. Sherral Hammers.  .  Mart Colpitts L 09/03/2014, 4:13 PM

## 2014-09-03 NOTE — Progress Notes (Signed)
Peripherally Inserted Central Catheter/Midline Placement  The IV Nurse has discussed with the patient and/or persons authorized to consent for the patient, the purpose of this procedure and the potential benefits and risks involved with this procedure.  The benefits include less needle sticks, lab draws from the catheter and patient may be discharged home with the catheter.  Risks include, but not limited to, infection, bleeding, blood clot (thrombus formation), and puncture of an artery; nerve damage and irregular heat beat.  Alternatives to this procedure were also discussed.  Consent obtained by RN from family due to pt lethargic and unable to sign.  Pt was arousable upon entering the room and was explained the procedure and that family gave consent.  Pt nodded her agreement to procedure and was cooperative for PICC placement.  PICC/Midline Placement Documentation  PICC / Midline Double Lumen 09/03/14 PICC Left Basilic 41 cm 1 cm (Active)  Indication for Insertion or Continuance of Line Chronic illness with exacerbations (CF, Sickle Cell, etc.);Prolonged intravenous therapies 09/03/2014  6:18 PM  Exposed Catheter (cm) 1 cm 09/03/2014  6:18 PM  Site Assessment Clean;Dry;Intact 09/03/2014  6:18 PM  Lumen #1 Status Flushed;Saline locked;Blood return noted 09/03/2014  6:18 PM  Lumen #2 Status Flushed;Saline locked;Blood return noted 09/03/2014  6:18 PM  Dressing Type Transparent 09/03/2014  6:18 PM  Dressing Status Clean;Dry;Intact;Antimicrobial disc in place 09/03/2014  6:18 PM  Line Care Connections checked and tightened 09/03/2014  6:18 PM  Dressing Intervention New dressing 09/03/2014  6:18 PM  Dressing Change Due 09/10/14 09/03/2014  6:18 PM       Elliot Dally 09/03/2014, 6:19 PM

## 2014-09-03 NOTE — Progress Notes (Signed)
Utilization review complete. Nissi Doffing RN CCM Case Mgmt phone [phone] 

## 2014-09-03 NOTE — ED Notes (Signed)
Pt CBG, 239. Nurse was notified. 

## 2014-09-03 NOTE — Consult Note (Signed)
PULMONARY / CRITICAL CARE MEDICINE   Name: Michelle Bowers MRN: [id number] DOB: 1930/07/01    ADMISSION DATE:  09/03/2014 CONSULTATION DATE:  09/03/14  REFERRING MD :  Dr Joseph Art, [facility]  CHIEF COMPLAINT:  Obtundation, Acute respiratory fafilure  INITIAL PRESENTATION: 81 yo woman w hx CVA and hemiparesis, COPD, HTN, chronic systolic CHF, recent hospitalizations for PNA end of June and then mid July. Brought to [facility] 7/24 with dyspnea, orthopnea. Placed on BiPAP and developed progressive obtundation. PCCM consulted for respiratory failure and altered MS.   STUDIES:  Head Ct scan 7/24 >> atrophy w prior L thalamic infarct, acute to subacute infarcts in R cerebellum, R thalamus  SIGNIFICANT EVENTS: BiPAP 7/24   HISTORY OF PRESENT ILLNESS:  81 yo woman, debilitated resident of [facility] following L thalamic CVA 6 years ago. Has R hemiplegia, also notes some increasing swallowing difficulty and coughing w PO intake. She was dx with PNA late June, was treated but has never fully recovered due to recurrent sx and suspected recurrent PNA treated w abx. On 7/24 she was dyspneic, hypoxemic. Brought to [facility] ED in resp distress, was placed on BiPAP. Noted to become less responsive. Labs showed acute on chronic renal insufficiency with hyperkalemia (treated medically). ABG with chronic hypercapnia and a normal pH. PCCM was asked to eval for resp failure and possible need for intubation.   PAST MEDICAL HISTORY :   has a past medical history of Depression; Hypercholesteremia; Hypertension; Diabetes mellitus without complication; COPD (chronic obstructive pulmonary disease); Renal disorder; Chronic kidney disease; CHF (congestive heart failure); and Stroke.  has past surgical history that includes Abdominal hysterectomy. Prior to Admission medications   Medication Sig Start Date End Date Taking? Authorizing Provider  albuterol (PROVENTIL HFA;VENTOLIN HFA) 108 (90 BASE) MCG/ACT inhaler Inhale 2 puffs into  the lungs every 6 (six) hours as needed for shortness of breath.   Yes Historical Provider, MD  ALPRAZolam (XANAX) 0.25 MG tablet Take 0.25 mg by mouth 2 (two) times daily as needed for anxiety.   Yes Historical Provider, MD  amLODipine (NORVASC) 10 MG tablet Take 10 mg by mouth daily.   Yes Historical Provider, MD  atorvastatin (LIPITOR) 10 MG tablet Take 10 mg by mouth daily.   Yes Historical Provider, MD  Cholecalciferol 1000 UNITS capsule Take 1,000 Units by mouth 2 (two) times daily.   Yes Historical Provider, MD  docusate sodium (COLACE) 100 MG capsule Take 100 mg by mouth 2 (two) times daily.   Yes Historical Provider, MD  DULoxetine (CYMBALTA) 60 MG capsule Take 60 mg by mouth daily.   Yes Historical Provider, MD  ferrous sulfate 325 (65 FE) MG tablet Take 325 mg by mouth 3 (three) times daily.   Yes Historical Provider, MD  fluticasone (FLONASE) 50 MCG/ACT nasal spray Place 2 sprays into both nostrils 2 (two) times daily.    Yes Historical Provider, MD  furosemide (LASIX) 20 MG tablet Take 20-40 mg by mouth 2 (two) times daily. Take 40mg  in the morning and 20mg  midday.   Yes Historical Provider, MD  glimepiride (AMARYL) 2 MG tablet Take 2 mg by mouth daily with breakfast.   Yes Historical Provider, MD  guaifenesin (ROBITUSSIN) 100 MG/5ML syrup Take 300 mg by mouth every 4 (four) hours as needed for cough.   Yes Historical Provider, MD  hydrALAZINE (APRESOLINE) 100 MG tablet Take 100 mg by mouth 3 (three) times daily.   Yes Historical Provider, MD  insulin detemir (LEVEMIR) 100 UNIT/ML injection Inject  8-10 Units into the skin See admin instructions. Take 8 units at bedtime. Also take 10 units in the AM   Yes Historical Provider, MD  insulin lispro (HUMALOG) 100 UNIT/ML injection Inject 0-7 Units into the skin daily as needed for high blood sugar. 120=0 units 120-150=3 units 151-175=5 units >176=7 units   Yes Historical Provider, MD  ipratropium-albuterol (DUONEB) 0.5-2.5 (3) MG/3ML SOLN  Take 3 mLs by nebulization every 6 (six) hours as needed. For shortness of breath   Yes Historical Provider, MD  lansoprazole (PREVACID) 30 MG capsule Take 30 mg by mouth daily at 12 noon.   Yes Historical Provider, MD  loratadine (CLARITIN) 10 MG tablet Take 10 mg by mouth daily as needed for allergies.   Yes Historical Provider, MD  mesalamine (LIALDA) 1.2 G EC tablet Take 2.4 g by mouth every evening.   Yes Historical Provider, MD  Omega-3 Fatty Acids (FISH OIL) 1000 MG CAPS Take 1 capsule by mouth 2 (two) times daily.   Yes Historical Provider, MD  phenol (CHLORASEPTIC) 1.4 % LIQD Use as directed 1 spray in the mouth or throat every 2 (two) hours as needed for throat irritation / pain.   Yes Historical Provider, MD  potassium chloride (MICRO-K) 10 MEQ CR capsule Take 20 mEq by mouth daily.    Yes Historical Provider, MD  Propylene Glycol (SYSTANE BALANCE) 0.6 % SOLN Place 1 drop into both eyes 2 (two) times daily.   Yes Historical Provider, MD  sennosides-docusate sodium (SENOKOT-S) 8.6-50 MG tablet Take 1 tablet by mouth 2 (two) times daily.   Yes Historical Provider, MD  traMADol (ULTRAM) 50 MG tablet Take 50 mg by mouth 2 (two) times daily as needed for moderate pain.   Yes Historical Provider, MD  vitamin B-12 (CYANOCOBALAMIN) 500 MCG tablet Take 500 mcg by mouth daily.   Yes Historical Provider, MD   Allergies  Allergen Reactions  . Codeine     unknown  . Doxazosin Mesylate     unknown  . Gemfibrozil     unknown  . Prednisone     REACTION: sensitivity  . Rosiglitazone Maleate     REACTION: sensitivity    FAMILY HISTORY:  has no family status information on file.  SOCIAL HISTORY:  reports that she quit smoking about 6 years ago. She does not have any smokeless tobacco history on file. She reports that she does not drink alcohol or use illicit drugs.  REVIEW OF SYSTEMS:  States that she has had dysphagia, dyspnea, cough with food and drink,   SUBJECTIVE:  Feels slightly  better, notes BiPAP uncomfortable  VITAL SIGNS: Temp:  [97.3 F (36.3 C)-99.5 F (37.5 C)] 98 F (36.7 C) (07/24 1658) Pulse Rate:  [60-99] 95 (07/24 1658) Resp:  [16-28] 17 (07/24 1658) BP: (117-173)/(34-60) 158/48 mmHg (07/24 1658) SpO2:  [94 %-100 %] 98 % (07/24 1658) FiO2 (%):  [40 %-50 %] 40 % (07/24 1450) HEMODYNAMICS:   VENTILATOR SETTINGS: Vent Mode:  [-]  FiO2 (%):  [40 %-50 %] 40 % INTAKE / OUTPUT:  Intake/Output Summary (Last 24 hours) at 09/03/14 1825 Last data filed at 09/03/14 0703  Gross per 24 hour  Intake      0 ml  Output    400 ml  Net   -400 ml    PHYSICAL EXAMINATION: General:  Elderly debilitated woman, BiPAp in place Neuro:  Awake, able to speak, weak cough, no movement on R, able to move L UE and LE HEENT:  OP clear, PERRL Cardiovascular:  Regular, no M Lungs:  Small volumes, no crackles or wheezes Abdomen:  Mildly distended, non-tender Musculoskeletal:  Poor tone on R Skin:  No rash  LABS:  CBC  Recent Labs Lab 09/03/14 0309  WBC 14.2*  HGB 11.2*  HCT 35.3*  PLT 265   Coag's No results for input(s): APTT, INR in the last 168 hours. BMET  Recent Labs Lab 09/03/14 0309 09/03/14 0533 09/03/14 0918 09/03/14 1330  NA 138  --   --  146*  K 6.3* 6.0* 5.6* 5.3*  CL 100*  --   --  106  CO2 29  --   --  31  BUN 42*  --   --  41*  CREATININE 1.52*  --   --  1.42*  GLUCOSE 173*  --   --  188*   Electrolytes  Recent Labs Lab 09/03/14 0309 09/03/14 0918 09/03/14 1330  CALCIUM 9.3  --  9.8  MG  --  2.3  --    Sepsis Markers  Recent Labs Lab 09/03/14 0540 09/03/14 1055 09/03/14 1330  LATICACIDVEN 0.75 1.0 1.1   ABG  Recent Labs Lab 09/03/14 0325 09/03/14 1300 09/03/14 1655  PHART 7.320* 7.346* 7.350  PCO2ART 58.9* 61.6* 63.4*  PO2ART 75.0* 101* 80.3   Liver Enzymes  Recent Labs Lab 09/03/14 0309  AST 14*  ALT 12*  ALKPHOS 73  BILITOT 0.5  ALBUMIN 3.0*   Cardiac Enzymes  Recent Labs Lab  09/03/14 0309 09/03/14 1055  TROPONINI 0.03 0.03   Glucose  Recent Labs Lab 09/03/14 0600 09/03/14 0834 09/03/14 1255 09/03/14 1654  GLUCAP 239* 180* 160* 163*    Imaging Ct Head Wo Contrast  09/03/2014   CLINICAL DATA:  Altered mental status  EXAM: CT HEAD WITHOUT CONTRAST  TECHNIQUE: Contiguous axial images were obtained from the base of the skull through the vertex without intravenous contrast.  COMPARISON:  Head CT August 24, 2008; brain MRI August 24, 2008  FINDINGS: Moderate diffuse atrophy is stable. There is no intracranial mass, hemorrhage, extra-axial fluid collection, or midline shift. There is small vessel disease throughout the centra semiovale bilaterally, most pronounced superiorly and posteriorly on the left. There is evidence of a prior infarct involving the tail of the caudate nucleus on the left as well as a portion of the left thalamus. There is decreased attenuation in the right thalamus which was not present previously and is consistent with an age uncertain infarct. There is also decreased attenuation in the inferior cerebellum on the right slightly inferior to the right dentate nucleus. This area has an appearance concerning for recent infarct. Bony calvarium appears intact. Mastoid air cells are clear.  IMPRESSION: Atrophy with multiple areas of prior infarct. Suspect recent infarct in the right cerebellum slightly inferior to the right dentate nucleus. Age uncertain infarct in the right thalamus. Chronic infarct left thalamus as well as in the tail of the caudate nucleus on the left. Extensive periventricular small vessel disease present. No hemorrhage or mass effect.   Electronically Signed   By: Bretta Bang III M.D.   On: 09/03/2014 16:08   Dg Chest Port 1 View  09/03/2014   CLINICAL DATA:  Acute onset of shortness of breath. Initial encounter.  EXAM: PORTABLE CHEST - 1 VIEW  COMPARISON:  Chest radiograph performed 08/07/2014  FINDINGS: The lungs are hypoexpanded.  Bibasilar airspace opacity could reflect pneumonia. No pleural effusion or pneumothorax is seen.  The cardiomediastinal silhouette is mildly enlarged.  No acute osseous abnormalities are identified.  IMPRESSION: Lungs hypoexpanded. Bibasilar airspace opacities could reflect pneumonia. Mild cardiomegaly.   Electronically Signed   By: Roanna Raider M.D.   On: 09/03/2014 04:33     ASSESSMENT / PLAN:  PULMONARY A: Acute respiratory failure COPD without exacerbation Possible acute aspiration PNA Possible cardiogenic pulm edema P:   Agree with support with BiPAP. ABG is compensated and MS is improving significantly compared with this am   Discussed status and prognosis with one of the patient's daughters at bedside for 30 minutes. Explained the potential indications for ETT and MV, also the patient's barriers to successful outcome should she require MV. The patient's other daughter and her HCPOA will be here tomorrow 7/25. We agreed that we should talk further about goals for care, particularly invasive measures, even if Ms Tidd stabilizes on current regimen since she will ultimately have similar problem at some point. I have recommended that the patient should probably not be intubated or undergo CPR because her likelihood of a meaningful survival in such a situation is very small. Her daughter acknowledged and understood this, wants to discuss with her sister and mother.   CARDIOVASCULAR CVL none A: acute on chronic systolic CHF P:  BiPAP support Diuresis if / when pt can tolerate from renal standpoint Cardiac meds held until she can safely take PO, lipitor and amlodipine ordered  RENAL A:  Acute on chronic renal failure Hyperkalemia P:   Appreciate Dr Deterding's assistance Follow BMP Renal US Kayexalate given  GASTROINTESTINAL A:  Nutrition SUP P:   NPO for now  HEMATOLOGIC A:  DVT prophylaxis P:  Enoxaparin   INFECTIOUS A:  Suspected aspiration PNA vs HCAP P:   BCx2  7/24 >> UC  7/24 >>  Sputum 7/24 >>   Vanco 7/24 >>  Pip/tazo 7/24 >>   ENDOCRINE A:  DM   P:   SSI  NEUROLOGIC A:  Obtundation, likely toxic metabolic, component of hypercapnia P:   RASS goal: 0 Minimize any sedating meds   FAMILY  - Updates: spoke with pt's daughter at bedside as detailed above  - Inter-disciplinary family meet or Palliative Care meeting due by:  09/10/14     TODAY'S SUMMARY: Acute on chronic resp failure due to PNA and possible CHF, obtunded. Improved pm 7/24. Continue BiPAP. Need to expand on goals of care discussion 7/25 with daughters.     Independent CC time 60 minutes  Levy Pupa, MD, PhD 09/03/2014, 7:41 PM [facility] [phone] or if no answer [phone]

## 2014-09-03 NOTE — H&P (Signed)
History and Physical  Michelle Bowers WUJ:[id number] DOB: 1931/01/18 DOA: 09/03/2014  PCP: Julian Hy, MD   Chief Complaint: dyspnea   HPI: Michelle Bowers is a 81 y.o. female with history of HTN, DM, COPD, CHF presenting with cc of dyspnea. Patient is currently on Bipap and can't provide history. No family at bedside ; she is a resident at [facility]. History taking from ER physician. She reported orthopnea. She had a pneumonia three weeks ago and she reported that her symptoms now are similar to her symptoms from 3 weeks ago. No chest pain.     Review of Systems:  Unable to perform due to clinical status.    Past Medical History  Diagnosis Date  . Depression   . Hypercholesteremia   . Hypertension   . Diabetes mellitus without complication   . COPD (chronic obstructive pulmonary disease)   . Renal disorder   . Chronic kidney disease   . CHF (congestive heart failure)   . Stroke     right deficits   Past Surgical History  Procedure Laterality Date  . Abdominal hysterectomy      partial   Social History:  reports that she quit smoking about 6 years ago. She does not have any smokeless tobacco history on file. She reports that she does not drink alcohol or use illicit drugs. she lives in a nursing home.   Allergies  Allergen Reactions  . Codeine     unknown  . Doxazosin Mesylate     unknown  . Gemfibrozil     unknown  . Prednisone     REACTION: sensitivity  . Rosiglitazone Maleate     REACTION: sensitivity    Unable to get FH due to clinical status.   Prior to Admission medications   Medication Sig Start Date End Date Taking? Authorizing Provider  albuterol (PROVENTIL HFA;VENTOLIN HFA) 108 (90 BASE) MCG/ACT inhaler Inhale 2 puffs into the lungs every 6 (six) hours as needed for shortness of breath.   Yes Historical Provider, MD  ALPRAZolam (XANAX) 0.25 MG tablet Take 0.25 mg by mouth 2 (two) times daily as needed for anxiety.   Yes Historical Provider, MD    amLODipine (NORVASC) 10 MG tablet Take 10 mg by mouth daily.   Yes Historical Provider, MD  atorvastatin (LIPITOR) 10 MG tablet Take 10 mg by mouth daily.   Yes Historical Provider, MD  Cholecalciferol 1000 UNITS capsule Take 1,000 Units by mouth 2 (two) times daily.   Yes Historical Provider, MD  docusate sodium (COLACE) 100 MG capsule Take 100 mg by mouth 2 (two) times daily.   Yes Historical Provider, MD  DULoxetine (CYMBALTA) 60 MG capsule Take 60 mg by mouth daily.   Yes Historical Provider, MD  ferrous sulfate 325 (65 FE) MG tablet Take 325 mg by mouth 3 (three) times daily.   Yes Historical Provider, MD  fluticasone (FLONASE) 50 MCG/ACT nasal spray Place 2 sprays into both nostrils 2 (two) times daily.    Yes Historical Provider, MD  furosemide (LASIX) 20 MG tablet Take 20-40 mg by mouth 2 (two) times daily. Take 40mg  in the morning and 20mg  midday.   Yes Historical Provider, MD  glimepiride (AMARYL) 2 MG tablet Take 2 mg by mouth daily with breakfast.   Yes Historical Provider, MD  guaifenesin (ROBITUSSIN) 100 MG/5ML syrup Take 300 mg by mouth every 4 (four) hours as needed for cough.   Yes Historical Provider, MD  hydrALAZINE (APRESOLINE) 100 MG tablet Take 100  mg by mouth 3 (three) times daily.   Yes Historical Provider, MD  insulin detemir (LEVEMIR) 100 UNIT/ML injection Inject 8-10 Units into the skin See admin instructions. Take 8 units at bedtime. Also take 10 units in the AM   Yes Historical Provider, MD  ipratropium-albuterol (DUONEB) 0.5-2.5 (3) MG/3ML SOLN Take 3 mLs by nebulization every 6 (six) hours as needed. For shortness of breath   Yes Historical Provider, MD  lansoprazole (PREVACID) 30 MG capsule Take 30 mg by mouth daily at 12 noon.   Yes Historical Provider, MD  loratadine (CLARITIN) 10 MG tablet Take 10 mg by mouth daily as needed for allergies.   Yes Historical Provider, MD  mesalamine (LIALDA) 1.2 G EC tablet Take 2.4 g by mouth every evening.   Yes Historical  Provider, MD  Omega-3 Fatty Acids (FISH OIL) 1000 MG CAPS Take 1 capsule by mouth 2 (two) times daily.   Yes Historical Provider, MD  phenol (CHLORASEPTIC) 1.4 % LIQD Use as directed 1 spray in the mouth or throat every 2 (two) hours as needed for throat irritation / pain.   Yes Historical Provider, MD  potassium chloride (MICRO-K) 10 MEQ CR capsule Take 20 mEq by mouth daily.    Yes Historical Provider, MD  Propylene Glycol (SYSTANE BALANCE) 0.6 % SOLN Place 1 drop into both eyes 2 (two) times daily.   Yes Historical Provider, MD  sennosides-docusate sodium (SENOKOT-S) 8.6-50 MG tablet Take 1 tablet by mouth 2 (two) times daily.   Yes Historical Provider, MD  traMADol (ULTRAM) 50 MG tablet Take 50 mg by mouth 2 (two) times daily as needed for moderate pain.   Yes Historical Provider, MD  vitamin B-12 (CYANOCOBALAMIN) 500 MCG tablet Take 500 mcg by mouth daily.   Yes Historical Provider, MD    Physical Exam: BP 166/42 mmHg  Pulse 71  Resp 22  SpO2 94%  General:  Appears in mild acute distress.  Eyes: non icteric.  ENT: moist mucous membranes.  Cardiovascular: RRR. No murmurs.  Respiratory: mild crackles in lung bases with shallow breaths and tachypnea.  Abdomen: soft. Non tender.  Skin: bruises on lower extremities.  Neurologic: alert, respond to commands ( squeeze hands ).           Labs on Admission:  Basic Metabolic Panel:  Recent Labs Lab 09/03/14 0309  NA 138  K 6.3*  CL 100*  CO2 29  GLUCOSE 173*  BUN 42*  CREATININE 1.52*  CALCIUM 9.3   Liver Function Tests:  Recent Labs Lab 09/03/14 0309  AST 14*  ALT 12*  ALKPHOS 73  BILITOT 0.5  PROT 6.0*  ALBUMIN 3.0*   No results for input(s): LIPASE, AMYLASE in the last 168 hours. No results for input(s): AMMONIA in the last 168 hours. CBC:  Recent Labs Lab 09/03/14 0309  WBC 14.2*  NEUTROABS 12.5*  HGB 11.2*  HCT 35.3*  MCV 105.1*  PLT 265   Cardiac Enzymes:  Recent Labs Lab 09/03/14 0309   TROPONINI 0.03    BNP (last 3 results)  Recent Labs  09/03/14 0309  BNP 377.8*    ProBNP (last 3 results) No results for input(s): PROBNP in the last 8760 hours.  CBG: No results for input(s): GLUCAP in the last 168 hours.  Radiological Exams on Admission: Dg Chest Port 1 View  09/03/2014   CLINICAL DATA:  Acute onset of shortness of breath. Initial encounter.  EXAM: PORTABLE CHEST - 1 VIEW  COMPARISON:  Chest radiograph  performed 08/07/2014  FINDINGS: The lungs are hypoexpanded. Bibasilar airspace opacity could reflect pneumonia. No pleural effusion or pneumothorax is seen.  The cardiomediastinal silhouette is mildly enlarged. No acute osseous abnormalities are identified.  IMPRESSION: Lungs hypoexpanded. Bibasilar airspace opacities could reflect pneumonia. Mild cardiomegaly.   Electronically Signed   By: Roanna Raider M.D.   On: 09/03/2014 04:33    EKG: Independently reviewed. Likely LVH with no significant new ST/T ischemic changes.   Assessment/Plan  Acute diastolic HF:  CXR with mild congestion and BNP is elevated although it could be falsely high due to AKI and old age.  Given 160 mg IV lasix in the ER after consulting with Nephro. Will monitor output Not on BB or ACEI Will check Echo  Possible Sepsis due to HCAP:  Started on vancomycin and zosyn  Will check lactic acid   Hypoxic and hypercapnic respiratory failure:  Likely multifactorial due to pneumonia vs. Acute HF vs. COPD exacerbation  On BiPAP and oxygen therapy Will r/o ACS with serial trops and EKG Continue albuterol Q4H Will repeat ABG  Hyperkalemia:  Likely iatrogenic  Given lasix IV in the ED; will repeat and monitor No EKG changes; given Ca gluconate in the ED  DM: start on low dose correction for now  AKI on CKD: baseline ~ 1.3.  Due to HF vs. Sepsis S/p lasix per Nephro recs to ER.  Nephro to see patient in am.  DVT prophylaxis: [place] enoxaparin.   Prognosis is guarded : tachypnea,  respiratory failure, change in mental status.   Consultants: pulmonary in am.   Code Status: full   Family Communication: none at bedside.   Disposition Plan: admit to step down.    Eston Esters MD [facility]

## 2014-09-03 NOTE — Progress Notes (Signed)
09/03/2014 Patient transfer from the emergency room to 2central at 0815. She arrive on unit and patient was on bipap and not interacting. Patient arrive on unit had Kayexalate enema in the Emergency room. Patient have bruising on arms, legs and sacrum red, but blanches. After patient was settle in room, she would open eyes when name was called. She is weak from a prior stroke on the left side. Patient was able to squeeze with the left arm, but she is weak. [facility] RN.

## 2014-09-04 ENCOUNTER — Inpatient Hospital Stay (HOSPITAL_COMMUNITY): Payer: Medicare Other

## 2014-09-04 ENCOUNTER — Ambulatory Visit (HOSPITAL_COMMUNITY): Payer: Medicare Other

## 2014-09-04 DIAGNOSIS — J811 Chronic pulmonary edema: Secondary | ICD-10-CM | POA: Diagnosis present

## 2014-09-04 DIAGNOSIS — E1165 Type 2 diabetes mellitus with hyperglycemia: Secondary | ICD-10-CM | POA: Diagnosis present

## 2014-09-04 DIAGNOSIS — R4182 Altered mental status, unspecified: Secondary | ICD-10-CM | POA: Diagnosis present

## 2014-09-04 DIAGNOSIS — F411 Generalized anxiety disorder: Secondary | ICD-10-CM | POA: Diagnosis present

## 2014-09-04 DIAGNOSIS — E785 Hyperlipidemia, unspecified: Secondary | ICD-10-CM | POA: Diagnosis present

## 2014-09-04 DIAGNOSIS — Z8673 Personal history of transient ischemic attack (TIA), and cerebral infarction without residual deficits: Secondary | ICD-10-CM | POA: Diagnosis present

## 2014-09-04 DIAGNOSIS — I639 Cerebral infarction, unspecified: Secondary | ICD-10-CM

## 2014-09-04 DIAGNOSIS — E876 Hypokalemia: Secondary | ICD-10-CM | POA: Diagnosis present

## 2014-09-04 DIAGNOSIS — IMO0002 Reserved for concepts with insufficient information to code with codable children: Secondary | ICD-10-CM | POA: Diagnosis present

## 2014-09-04 DIAGNOSIS — J810 Acute pulmonary edema: Secondary | ICD-10-CM

## 2014-09-04 DIAGNOSIS — I10 Essential (primary) hypertension: Secondary | ICD-10-CM

## 2014-09-04 DIAGNOSIS — R0600 Dyspnea, unspecified: Secondary | ICD-10-CM

## 2014-09-04 DIAGNOSIS — J189 Pneumonia, unspecified organism: Secondary | ICD-10-CM | POA: Diagnosis present

## 2014-09-04 LAB — BLOOD GAS, ARTERIAL
Acid-Base Excess: 6 mmol/L — ABNORMAL HIGH (ref 0.0–2.0)
BICARBONATE: 30.9 meq/L — AB (ref 20.0–24.0)
DRAWN BY: 42624
Delivery systems: POSITIVE
Expiratory PAP: 5
FIO2: 0.4 %
INSPIRATORY PAP: 12
LHR: 16 {breaths}/min
O2 Saturation: 96.7 %
PH ART: 7.384 (ref 7.350–7.450)
Patient temperature: 98.6
TCO2: 32.5 mmol/L (ref 0–100)
pCO2 arterial: 53 mmHg — ABNORMAL HIGH (ref 35.0–45.0)
pO2, Arterial: 90.6 mmHg (ref 80.0–100.0)

## 2014-09-04 LAB — LEGIONELLA ANTIGEN, URINE

## 2014-09-04 LAB — CBC WITH DIFFERENTIAL/PLATELET
BASOS ABS: 0 10*3/uL (ref 0.0–0.1)
BASOS PCT: 1 % (ref 0–1)
EOS ABS: 0.2 10*3/uL (ref 0.0–0.7)
EOS PCT: 2 % (ref 0–5)
HCT: 29.5 % — ABNORMAL LOW (ref 36.0–46.0)
HEMOGLOBIN: 9.3 g/dL — AB (ref 12.0–15.0)
LYMPHS ABS: 0.6 10*3/uL — AB (ref 0.7–4.0)
LYMPHS PCT: 8 % — AB (ref 12–46)
MCH: 33.5 pg (ref 26.0–34.0)
MCHC: 31.5 g/dL (ref 30.0–36.0)
MCV: 106.1 fL — ABNORMAL HIGH (ref 78.0–100.0)
MONOS PCT: 9 % (ref 3–12)
Monocytes Absolute: 0.7 10*3/uL (ref 0.1–1.0)
Neutro Abs: 6 10*3/uL (ref 1.7–7.7)
Neutrophils Relative %: 80 % — ABNORMAL HIGH (ref 43–77)
Platelets: 209 10*3/uL (ref 150–400)
RBC: 2.78 MIL/uL — ABNORMAL LOW (ref 3.87–5.11)
RDW: 14.1 % (ref 11.5–15.5)
WBC: 7.5 10*3/uL (ref 4.0–10.5)

## 2014-09-04 LAB — GLUCOSE, CAPILLARY
GLUCOSE-CAPILLARY: 345 mg/dL — AB (ref 65–99)
GLUCOSE-CAPILLARY: 217 mg/dL — AB (ref 65–99)
Glucose-Capillary: 255 mg/dL — ABNORMAL HIGH (ref 65–99)
Glucose-Capillary: 348 mg/dL — ABNORMAL HIGH (ref 65–99)
Glucose-Capillary: 239 mg/dL — ABNORMAL HIGH (ref 65–99)

## 2014-09-04 LAB — COMPREHENSIVE METABOLIC PANEL
ALBUMIN: 2.3 g/dL — AB (ref 3.5–5.0)
ALK PHOS: 57 U/L (ref 38–126)
ALT: 11 U/L — AB (ref 14–54)
AST: 10 U/L — ABNORMAL LOW (ref 15–41)
Anion gap: 10 (ref 5–15)
BILIRUBIN TOTAL: 0.8 mg/dL (ref 0.3–1.2)
BUN: 38 mg/dL — ABNORMAL HIGH (ref 6–20)
CHLORIDE: 103 mmol/L (ref 101–111)
CO2: 32 mmol/L (ref 22–32)
Calcium: 8.8 mg/dL — ABNORMAL LOW (ref 8.9–10.3)
Creatinine, Ser: 1.64 mg/dL — ABNORMAL HIGH (ref 0.44–1.00)
GFR calc Af Amer: 33 mL/min — ABNORMAL LOW (ref >60)
GFR calc non Af Amer: 28 mL/min — ABNORMAL LOW (ref >60)
GLUCOSE: 264 mg/dL — AB (ref 65–99)
Potassium: 3.4 mmol/L — ABNORMAL LOW (ref 3.5–5.1)
Sodium: 145 mmol/L (ref 135–145)
Total Protein: 4.9 g/dL — ABNORMAL LOW (ref 6.5–8.1)

## 2014-09-04 LAB — HEMOGLOBIN A1C
Hgb A1c MFr Bld: 7.5 % — ABNORMAL HIGH (ref 4.8–5.6)
Mean Plasma Glucose: 169 mg/dL

## 2014-09-04 LAB — MAGNESIUM: MAGNESIUM: 2.1 mg/dL (ref 1.7–2.4)

## 2014-09-04 LAB — HIV ANTIBODY (ROUTINE TESTING W REFLEX): HIV SCREEN 4TH GENERATION: NONREACTIVE

## 2014-09-04 MED ORDER — INSULIN ASPART 100 UNIT/ML ~~LOC~~ SOLN
0.0000 [IU] | SUBCUTANEOUS | Status: DC
  Administered 2014-09-04 (×2): 7 [IU] via SUBCUTANEOUS
  Administered 2014-09-05: 3 [IU] via SUBCUTANEOUS
  Administered 2014-09-05: 11 [IU] via SUBCUTANEOUS
  Administered 2014-09-05: 4 [IU] via SUBCUTANEOUS
  Administered 2014-09-05 (×2): 3 [IU] via SUBCUTANEOUS
  Administered 2014-09-06 (×2): 4 [IU] via SUBCUTANEOUS
  Administered 2014-09-06: 3 [IU] via SUBCUTANEOUS
  Administered 2014-09-06: 4 [IU] via SUBCUTANEOUS
  Administered 2014-09-06: 7 [IU] via SUBCUTANEOUS
  Administered 2014-09-06 (×2): 3 [IU] via SUBCUTANEOUS
  Administered 2014-09-07: 4 [IU] via SUBCUTANEOUS
  Administered 2014-09-07: 11 [IU] via SUBCUTANEOUS
  Administered 2014-09-07: 4 [IU] via SUBCUTANEOUS
  Administered 2014-09-07 (×2): 7 [IU] via SUBCUTANEOUS
  Administered 2014-09-08: 11 [IU] via SUBCUTANEOUS
  Administered 2014-09-08: 4 [IU] via SUBCUTANEOUS
  Administered 2014-09-08: 3 [IU] via SUBCUTANEOUS
  Administered 2014-09-08: 11 [IU] via SUBCUTANEOUS
  Administered 2014-09-08: 7 [IU] via SUBCUTANEOUS
  Administered 2014-09-09: 20 [IU] via SUBCUTANEOUS
  Administered 2014-09-09: 4 [IU] via SUBCUTANEOUS
  Administered 2014-09-09: 20 [IU] via SUBCUTANEOUS
  Administered 2014-09-09: 11 [IU] via SUBCUTANEOUS
  Administered 2014-09-10 (×2): 7 [IU] via SUBCUTANEOUS
  Administered 2014-09-10: 20 [IU] via SUBCUTANEOUS
  Administered 2014-09-10: 7 [IU] via SUBCUTANEOUS

## 2014-09-04 MED ORDER — POTASSIUM CHLORIDE 10 MEQ/100ML IV SOLN
10.0000 meq | INTRAVENOUS | Status: AC
  Administered 2014-09-04 (×3): 10 meq via INTRAVENOUS
  Filled 2014-09-04 (×3): qty 100

## 2014-09-04 MED ORDER — LORAZEPAM 2 MG/ML IJ SOLN
0.2500 mg | Freq: Every day | INTRAMUSCULAR | Status: DC | PRN
  Administered 2014-09-05 – 2014-09-08 (×3): 0.25 mg via INTRAVENOUS
  Filled 2014-09-04 (×3): qty 1

## 2014-09-04 MED ORDER — LORAZEPAM 2 MG/ML IJ SOLN
0.2500 mg | Freq: Once | INTRAMUSCULAR | Status: AC
  Administered 2014-09-04: 0.25 mg via INTRAVENOUS
  Filled 2014-09-04: qty 1

## 2014-09-04 MED ORDER — ATORVASTATIN CALCIUM 20 MG PO TABS
20.0000 mg | ORAL_TABLET | Freq: Every day | ORAL | Status: DC
  Administered 2014-09-05 – 2014-09-12 (×8): 20 mg via ORAL
  Filled 2014-09-04 (×8): qty 1

## 2014-09-04 MED ORDER — FUROSEMIDE 10 MG/ML IJ SOLN
60.0000 mg | Freq: Once | INTRAMUSCULAR | Status: AC
  Administered 2014-09-04: 60 mg via INTRAVENOUS
  Filled 2014-09-04: qty 6

## 2014-09-04 MED ORDER — DULOXETINE HCL 60 MG PO CPEP
60.0000 mg | ORAL_CAPSULE | Freq: Every day | ORAL | Status: DC
  Administered 2014-09-04 – 2014-09-12 (×9): 60 mg via ORAL
  Filled 2014-09-04 (×9): qty 1

## 2014-09-04 MED ORDER — INSULIN GLARGINE 100 UNIT/ML ~~LOC~~ SOLN
5.0000 [IU] | Freq: Every day | SUBCUTANEOUS | Status: DC
  Administered 2014-09-04 – 2014-09-07 (×4): 5 [IU] via SUBCUTANEOUS
  Filled 2014-09-04 (×4): qty 0.05

## 2014-09-04 NOTE — Progress Notes (Signed)
Pt has had no verbal responses with small responses to pain and does not follow any commands.

## 2014-09-04 NOTE — Progress Notes (Signed)
  Echocardiogram 2D Echocardiogram has been performed.  Arvil Chaco 09/04/2014, 2:41 PM

## 2014-09-04 NOTE — Progress Notes (Signed)
At 0445 pt opened eyes, I asked her if she was okay and she nodded her head yes.  I asked her to squeeze my hand with her left hand and she followed the command. Her grip was extremely weak.

## 2014-09-04 NOTE — Progress Notes (Signed)
Unable to scan several meds adm after 1200 d/t computer issues and malfunctions -meds entered manually.  Five rights of med adm verified with each med adm.    IT has been notified of this issue and have stated that a tech will be dispatched to rectify this problem.

## 2014-09-04 NOTE — Progress Notes (Signed)
Inpatient Diabetes Program Recommendations  AACE/ADA: New Consensus Statement on Inpatient Glycemic Control (2013)  Target Ranges:  Prepandial:   less than 140 mg/dL      Peak postprandial:   less than 180 mg/dL (1-2 hours)      Critically ill patients:  140 - 180 mg/dL   Reason for Visit: Hyperglycemia  Diabetes history: DM2 Outpatient Diabetes medications: Levemir 10 units in am and 8 units QHS Current orders for Inpatient glycemic control: Novolog sensitive tidwc  Results for CREE, NAPOLI (MRN [id number]) as of 09/04/2014 10:51  Ref. Range 09/03/2014 12:55 09/03/2014 16:54 09/03/2014 22:18 09/04/2014 04:47 09/04/2014 07:27  Glucose-Capillary Latest Ref Range: 65-99 mg/dL 147 (H) 829 (H) 562 (H) 255 (H) 348 (H)    Inpatient Diabetes Program Recommendations Insulin - Basal: Add Levemir 5 units in am and 4 units QHS. (This is half of home dose) HgbA1C: Check Hgba1C to assess glycemic control prior to hospitalization  Note: Will continue to follow. Thank you. Ailene Ards, RD, LDN, CDE Inpatient Diabetes Coordinator [phone]

## 2014-09-04 NOTE — Progress Notes (Signed)
[facility] TEAM 1 - Stepdown/ICU TEAM Progress Note  [place] Banwart ZOX:[id number] DOB: March 19, 1930 DOA: 09/03/2014 PCP: Julian Hy, MD  Admit HPI / Brief Narrative: 81 y.o. WF PMHx Depression, CVA, HTN, CHF DM type II, COPD, presenting with cc of dyspnea. Patient is currently on Bipap and can't provide history. No family at bedside ; she is a resident at [facility]. History taking from ER physician. She reported orthopnea. She had a pneumonia three weeks ago and she reported that her symptoms now are similar to her symptoms from 3 weeks ago. No chest pain.   HPI/Subjective: 7/25  A/O  patient on BiPAP, appears to understand questions and nods yes and no. Recognizes family members and greets them appropriately. Daughters at bedside and verified that starting  ~29 June began to have increased altered mental status secondary to pneumonia. Was treated with a course of antibiotics recovered but not fully, and then again began to have increased altered mental status, dysphagia, and increased WOB/SOB. Patient is a 1 PPD 50 years smoker (stopped July 2010).    Assessment/Plan: Acute diastolic HF:  -10/24 PCXR; bibasilar opacification see results below. -Elevated BNP possibly secondary to AKI.  -Given 160 mg IV lasix in the ER after consulting with Nephro. Will monitor output -Not on BB or ACEI -Echocardiogram pending  -Troponin trending; 2 sets negative   Pulmonary edema? -Patient with increased rhonchi especially in RLL  -Decrease D5W-0.45% saline to 77ml/hr -Lasix 60 mg 1 -PCXR pending  Sepsis due to HCAP?/Aspiration pneumonia?  -Continue Vancomycin and Zosyn  -Lactic Acid within normal limit  -Strep pneumo urine antigen, Legionella urine antigen pending -Sputum pending -DuoNeb QID -Mucinex DM BID  Acute respiratory failure with Hypercapnia   -Likely multifactorial due to Pneumonia vs. Acute HF vs. COPD exacerbation  -Continue  BiPAP and oxygen therapy PRN  -Will r/o ACS  with serial trops and EKG -Continue albuterol Q4H -ABG at 1600; shows worsening PCO2---> patient requires intubation. Have spoken with PCCM and they concur -7/25 ABG shows significant improvement on BiPAP  Hyperkalemia:  -Iatrogenic ? -Given lasix IV in the ED and Ca gluconate in the ED -7/24 Repeat potassium 5.6 and magnesium 2.3; --> Kayexalate 45 g rectal; repeat K= 5.3--> Kayexalate 60 gm  Hypokalemia -Potassium goal> 4 -Potassium IV 10 mg 3  DM Type II uncontrolled? -7/24 Hemoglobin A1c= 7.5  -Increase to resistant SSI -Start Lantus 5 mg daily  HLD -Lipid panel; slightly outside ADA guidelines -Increase Lipitor to 20 mg daily  Acute on chronic kidney failure (Cr Baseline ~ 1.3).  -Due to HF vs. Sepsis -S/p lasix per Nephro recs to ER.  -Nephro monitoring.  Anxiety -Patient's home regimen Xanax 0.25 mg  BID PRN; start Ativan 0.25 mg daily PRN   -Restart Cymbalta 60 mg daily  Altered mental status -Most likely multifactorial to include sepsis, acute/subacute stroke; appears to be clearing  Acute stroke on chronic strokes -7/24 CT head shows probable acute stroke with multiple chronic stroke. See results below -Allow for permissive HTN -Carotid Dopplers pending -MRI brain pending, MRI head pending      Code Status: FULL Family Communication: Spoke with [name] (daughter) Avon Gully [phone] and she would like to have her mother intubated at this time, to see if it would help.  Disposition Plan: Resolution sepsis/completion of stroke workup    Consultants: Dr. Pasty Arch (PCCM)    Procedure/Significant Events: 7/24 PCXR; poor respiratory effort resulted in hyperexpansion, bibasilar airspace opacification to my: Pneumonia? 7/24 CT head  without contrast; Atrophy with multiple areas of prior infarct.  -Suspect recent infarct Rt cerebellum slightly inferior to the right dentate nucleus.  -Age uncertain infarct Rt thalamus. -Chronic infarct Lt thalamus  as well as in the tail of the caudate nucleus 7/25 renal ultrasound;-Rt kidney atrophic w/ thinning and increased echogenicity renal parenchyma, suggestive of chronic renal Dz     Culture 7/24 MRSA by PCR negative 7/24 blood left/right arm NGTD 7/24 urine pending 7/24 strep pneumo urine antigen negative   Antibiotics: Zosyn 7/24>> Vancomycin 7/24>>   DVT prophylaxis: Lovenox   Devices    LINES / TUBES:      Continuous Infusions: . dextrose 5 % and 0.45% NaCl 100 mL/hr at 09/03/14 2259    Objective: VITAL SIGNS: Temp: 97.7 F (36.5 C) (07/25 1321) Temp Source: Oral (07/25 1321) BP: 139/42 mmHg (07/25 1321) Pulse Rate: 93 (07/25 1321) SPO2; FIO2:   Intake/Output Summary (Last 24 hours) at 09/04/14 1353 Last data filed at 09/04/14 0940  Gross per 24 hour  Intake   1420 ml  Output   1155 ml  Net    265 ml     Exam: General: A/O  patient on BiPAP, appears to understand questions and nods yes and no. Recognizes family members and greets them appropriately, follows commands, positive acute respiratory distress Eyes: Pupils equal round reactive to light and accommodation   ENT: negative gingival bleeding Neck:  Negative scars, masses, torticollis, lymphadenopathy, JVD Lungs: poor inspiratory effort, decreased breath sounds diffusely, RML/RLL bronchi, negative expiratory wheezing, bibasilar crackles.  Cardiovascular: Regular rate and rhythm without murmur gallop or rub normal S1 and S2 Abdomen: Soft, nontender, plus bowel sounds  Extremities: No significant cyanosis, clubbing, or edema bilateral lower extremities Psychiatric:  Unable to assess  Neurologic:  Follows commands, interacts appropriately with family members, right sided hemiparesis, left-sided upper/lower extremity strength 5/5, sensation intact throughout, did not ambulate patient.     Data Reviewed: Basic Metabolic Panel:  Recent Labs Lab 09/03/14 0309 09/03/14 0533 09/03/14 0918  09/03/14 1330 09/03/14 2122 09/04/14 0413  NA 138  --   --  146* 140 145  K 6.3* 6.0* 5.6* 5.3* 3.4* 3.4*  CL 100*  --   --  106 98* 103  CO2 29  --   --  31 34* 32  GLUCOSE 173*  --   --  188* 151* 264*  BUN 42*  --   --  41* 38* 38*  CREATININE 1.52*  --   --  1.42* 1.52* 1.64*  CALCIUM 9.3  --   --  9.8 8.9 8.8*  MG  --   --  2.3  --  2.3 2.1   Liver Function Tests:  Recent Labs Lab 09/03/14 0309 09/04/14 0413  AST 14* 10*  ALT 12* 11*  ALKPHOS 73 57  BILITOT 0.5 0.8  PROT 6.0* 4.9*  ALBUMIN 3.0* 2.3*   No results for input(s): LIPASE, AMYLASE in the last 168 hours. No results for input(s): AMMONIA in the last 168 hours. CBC:  Recent Labs Lab 09/03/14 0309 09/04/14 0413  WBC 14.2* 7.5  NEUTROABS 12.5* 6.0  HGB 11.2* 9.3*  HCT 35.3* 29.5*  MCV 105.1* 106.1*  PLT 265 209   Cardiac Enzymes:  Recent Labs Lab 09/03/14 0309 09/03/14 1055  TROPONINI 0.03 0.03   BNP (last 3 results)  Recent Labs  09/03/14 0309  BNP 377.8*    ProBNP (last 3 results) No results for input(s): PROBNP in the last 8760 hours.  CBG:  Recent Labs Lab 09/03/14 1654 09/03/14 2218 09/04/14 0447 09/04/14 0727 09/04/14 1321  GLUCAP 163* 119* 255* 348* 345*    Recent Results (from the past 240 hour(s))  MRSA PCR Screening     Status: None   Collection Time: 09/03/14  8:41 AM  Result Value Ref Range Status   MRSA by PCR NEGATIVE NEGATIVE Final    Comment:        The GeneXpert MRSA Assay (FDA approved for NASAL specimens only), is one component of a comprehensive MRSA colonization surveillance program. It is not intended to diagnose MRSA infection nor to guide or monitor treatment for MRSA infections.   Culture, blood (routine x 2)     Status: None (Preliminary result)   Collection Time: 09/03/14  9:56 AM  Result Value Ref Range Status   Specimen Description BLOOD LEFT ARM  Final   Special Requests IN PEDIATRIC BOTTLE 1CC  Final   Culture NO GROWTH 1 DAY  Final    Report Status PENDING  Incomplete  Culture, blood (routine x 2)     Status: None (Preliminary result)   Collection Time: 09/03/14 11:10 AM  Result Value Ref Range Status   Specimen Description BLOOD RIGHT ARM  Final   Special Requests BOTTLES DRAWN AEROBIC ONLY 7CC  Final   Culture NO GROWTH 1 DAY  Final   Report Status PENDING  Incomplete     Studies:  Recent x-ray studies have been reviewed in detail by the Attending Physician  Scheduled Meds:  Scheduled Meds: . amLODipine  10 mg Oral Daily  . [START ON 09/05/2014] atorvastatin  20 mg Oral Daily  . dextromethorphan-guaiFENesin  1 tablet Oral BID  . docusate sodium  100 mg Oral BID  . DULoxetine  60 mg Oral Daily  . enoxaparin (LOVENOX) injection  40 mg Subcutaneous Q24H  . furosemide  60 mg Intravenous Once  . hydrALAZINE  100 mg Oral 3 times per day  . insulin aspart  0-20 Units Subcutaneous 6 times per day  . insulin glargine  5 Units Subcutaneous Daily  . ipratropium-albuterol  3 mL Nebulization Q6H  . LORazepam  0.25 mg Intravenous Once  . piperacillin-tazobactam (ZOSYN)  IV  3.375 g Intravenous 3 times per day  . potassium chloride  10 mEq Intravenous Q1 Hr x 3  . sodium chloride  10-40 mL Intracatheter Q12H  . sodium polystyrene  60 g Rectal Once  . vancomycin  500 mg Intravenous Q24H    Time spent on care of this patient: 40 mins   WOODS, Roselind Messier , MD  [facility] Office  [phone] Pager [phone]  On-Call/Text Page:      [email or web address]      password [email or web address]  If 7PM-7AM, please contact night-coverage [email or web address] Password [email or web address] 09/04/2014, 1:53 PM   LOS: 1 day   Care during the described time interval was provided by me .  I have reviewed this patient's available data, including medical history, events of note, physical examination, and all test results as part of my evaluation. I have personally reviewed and interpreted all radiology studies.   Carolyne Littles, MD [phone] Pager

## 2014-09-04 NOTE — Progress Notes (Signed)
At 0530 pt tried to pull off her mask, she looked at RN and said "i need water".  Pt stated her name, DOB, and the year.  RN took pt off BIPAP and placed her on 4LNC.  RT - Dondra Prader was notified. Pt sats are maintaining >92%.  Pt was able to take her Hydralazine (with applesauce) and drink of water.    Urine output has been a total of over 11 hours.  RN will continue to monitor.

## 2014-09-04 NOTE — Clinical Social Work Note (Signed)
Clinical Social Work Assessment  Patient Details  Name: Michelle Bowers MRN: [id number] Date of Birth: Apr 27, 1930  Date of referral:  09/04/14               Reason for consult:  Facility Placement                Permission sought to share information with:  Family Supports, Oceanographer granted to share information::  No (pt confused per RN report)  Name::     Michelle Bowers  Agency::  [facility]  Relationship::     Contact Information:     Housing/Transportation Living arrangements for the past 2 months:  Skilled Building surveyor of Information:  Adult Children Patient Interpreter Needed:  None Criminal Activity/Legal Involvement Pertinent to Current Situation/Hospitalization:  No - Comment as needed Significant Relationships:  Adult Children Lives with:  Facility Resident Do you feel safe going back to the place where you live?  Yes Need for family participation in patient care:  Yes (Comment)  Care giving concerns:  None pt is LTC resident at [facility]   Social Worker assessment / plan:  CSW spoke with pt daughter concerning return to [facility] at time of DC  Employment status:  Retired Wellsite geologist, Medicaid In [place] PT Recommendations:    Information / Referral to community resources:  Skilled Nursing Facility  Patient/Family's Response to care:  Patients family agreeable to pt return to [facility] when medically stable  Patient/Family's Understanding of and Emotional Response to Diagnosis, Current Treatment, and Prognosis:  Pt family is concerned about inability to pay bed hold for the pt and is hopefu lpt can return so so they will not lose their bed at the facility  Emotional Assessment Appearance:    Attitude/Demeanor/Rapport:  Unable to Assess Affect (typically observed):  Unable to Assess Orientation:  Oriented to Place, Oriented to Self Alcohol / Substance use:  Not Applicable Psych involvement  (Current and /or in the community):  No (Comment)  Discharge Needs  Concerns to be addressed:  Care Coordination Readmission within the last 30 days:  No Current discharge risk:  Physical Impairment, None Barriers to Discharge:  Continued Medical Work up   [employer], LCSW 09/04/2014, 2:31 PM

## 2014-09-04 NOTE — Progress Notes (Signed)
PULMONARY / CRITICAL CARE MEDICINE   Name: Michelle Bowers MRN: [id number] DOB: [birth year]    ADMISSION DATE:  09/03/2014 CONSULTATION DATE:  09/03/14  REFERRING MD :  Dr Joseph Art, [facility]  CHIEF COMPLAINT:  Obtundation, Acute respiratory fafilure  INITIAL PRESENTATION: 81 yo woman w hx CVA and hemiparesis, COPD, HTN, chronic systolic CHF, recent hospitalizations for PNA end of June and then mid July. Brought to [facility] 7/24 with dyspnea, orthopnea. Placed on BiPAP and developed progressive obtundation. PCCM consulted for respiratory failure and altered MS.   STUDIES:  Head Ct scan 7/24 >> atrophy w prior L thalamic infarct, acute to subacute infarcts in R cerebellum, R thalamus  SIGNIFICANT EVENTS: BiPAP 7/24   SUBJECTIVE:  Feels slightly better, bipap has made breathing more comfortable. Denies pain.   VITAL SIGNS: Temp:  [96.7 F (35.9 C)-98 F (36.7 C)] 97.6 F (36.4 C) (07/25 0728) Pulse Rate:  [55-102] 102 (07/25 1005) Resp:  [17-29] 29 (07/25 1005) BP: (107-158)/(36-51) 142/42 mmHg (07/25 1000) SpO2:  [92 %-100 %] 95 % (07/25 1005) FiO2 (%):  [40 %-50 %] 40 % (07/25 0224) HEMODYNAMICS:   VENTILATOR SETTINGS: Vent Mode:  [-]  FiO2 (%):  [40 %-50 %] 40 % INTAKE / OUTPUT:  Intake/Output Summary (Last 24 hours) at 09/04/14 1118 Last data filed at 09/04/14 0940  Gross per 24 hour  Intake   1470 ml  Output   1155 ml  Net    315 ml    PHYSICAL EXAMINATION: General:  Elderly debilitated woman, BiPAp in place Neuro:  Awake, able to speak, appropriate, weak cough HEENT:  OP clear, PERRL, bipap  Cardiovascular:  Regular, no M Lungs:  resps even non labored on bipap, Small volumes, few scattered rhonchi  Abdomen:  Mildly distended, non-tender Musculoskeletal:  Poor tone on R Skin:  No rash  LABS:  CBC  Recent Labs Lab 09/03/14 0309 09/04/14 0413  WBC 14.2* 7.5  HGB 11.2* 9.3*  HCT 35.3* 29.5*  PLT 265 209   Coag's No results for input(s): APTT, INR in the  last 168 hours. BMET  Recent Labs Lab 09/03/14 1330 09/03/14 2122 09/04/14 0413  NA 146* 140 145  K 5.3* 3.4* 3.4*  CL 106 98* 103  CO2 31 34* 32  BUN 41* 38* 38*  CREATININE 1.42* 1.52* 1.64*  GLUCOSE 188* 151* 264*   Electrolytes  Recent Labs Lab 09/03/14 0918 09/03/14 1330 09/03/14 2122 09/04/14 0413  CALCIUM  --  9.8 8.9 8.8*  MG 2.3  --  2.3 2.1   Sepsis Markers  Recent Labs Lab 09/03/14 1055 09/03/14 1330 09/03/14 1732  LATICACIDVEN 1.0 1.1 0.9   ABG  Recent Labs Lab 09/03/14 1300 09/03/14 1655 09/04/14 0432  PHART 7.346* 7.350 7.384  PCO2ART 61.6* 63.4* 53.0*  PO2ART 101* 80.3 90.6   Liver Enzymes  Recent Labs Lab 09/03/14 0309 09/04/14 0413  AST 14* 10*  ALT 12* 11*  ALKPHOS 73 57  BILITOT 0.5 0.8  ALBUMIN 3.0* 2.3*   Cardiac Enzymes  Recent Labs Lab 09/03/14 0309 09/03/14 1055  TROPONINI 0.03 0.03   Glucose  Recent Labs Lab 09/03/14 0834 09/03/14 1255 09/03/14 1654 09/03/14 2218 09/04/14 0447 09/04/14 0727  GLUCAP 180* 160* 163* 119* 255* 348*    Imaging Ct Head Wo Contrast  09/03/2014   CLINICAL DATA:  Altered mental status  EXAM: CT HEAD WITHOUT CONTRAST  TECHNIQUE: Contiguous axial images were obtained from the base of the skull through the vertex without intravenous contrast.  COMPARISON:  Head CT August 24, 2008; brain MRI August 24, 2008  FINDINGS: Moderate diffuse atrophy is stable. There is no intracranial mass, hemorrhage, extra-axial fluid collection, or midline shift. There is small vessel disease throughout the centra semiovale bilaterally, most pronounced superiorly and posteriorly on the left. There is evidence of a prior infarct involving the tail of the caudate nucleus on the left as well as a portion of the left thalamus. There is decreased attenuation in the right thalamus which was not present previously and is consistent with an age uncertain infarct. There is also decreased attenuation in the inferior  cerebellum on the right slightly inferior to the right dentate nucleus. This area has an appearance concerning for recent infarct. Bony calvarium appears intact. Mastoid air cells are clear.  IMPRESSION: Atrophy with multiple areas of prior infarct. Suspect recent infarct in the right cerebellum slightly inferior to the right dentate nucleus. Age uncertain infarct in the right thalamus. Chronic infarct left thalamus as well as in the tail of the caudate nucleus on the left. Extensive periventricular small vessel disease present. No hemorrhage or mass effect.   Electronically Signed   By: Bretta Bang III M.D.   On: 09/03/2014 16:08   US Renal Port  09/04/2014   CLINICAL DATA:  Oliguria.  EXAM: RENAL / URINARY TRACT ULTRASOUND COMPLETE  COMPARISON:  None.  FINDINGS: Right Kidney:  Length: 8.6 cm. There is thinning of the renal parenchyma and increased renal echogenicity. Technically limited evaluation of the right kidney limiting detailed evaluation.  Left Kidney:  Length: 9.3 cm. Echogenicity within normal limits. No mass or hydronephrosis visualized.  Bladder:  Decompressed by Foley catheter not evaluated.  IMPRESSION: 1. Right kidney appears atrophic with thinning and increased echogenicity of the renal parenchyma, suggestive of chronic renal disease. Cortical thickness of the left kidney is preserved. 2. No hydronephrosis. 3. Urinary bladder decompressed by Foley catheter.   Electronically Signed   By: Rubye Oaks M.D.   On: 09/04/2014 02:43     ASSESSMENT / PLAN:  PULMONARY A: Acute respiratory failure COPD without exacerbation Possible acute aspiration PNA Possible cardiogenic pulm edema P:   Continue bipap for now - on 4 hours, off 30 mins F/u CXR  See discussion below   CARDIOVASCULAR CVL none A: acute on chronic systolic CHF P:  BiPAP support Diuresis if / when pt can tolerate from renal standpoint Cardiac meds held until she can safely take PO, lipitor and amlodipine  ordered  RENAL A:  Acute on chronic renal failure Hyperkalemia - resolved  P:   Appreciate renal's assistance Follow BMP Renal US neg hydro   GASTROINTESTINAL A:  Nutrition SUP P:   NPO for now  HEMATOLOGIC A:  DVT prophylaxis P:  Enoxaparin   INFECTIOUS A:  Suspected aspiration PNA vs HCAP P:   BCx2 7/24 >> UC  7/24 >>  Sputum 7/24 >>   Vanco 7/24 >>  Pip/tazo 7/24 >>   ENDOCRINE A:  DM   P:   SSI  NEUROLOGIC A:  Obtundation, likely toxic metabolic, component of hypercapnia P:   RASS goal: 0 Minimize any sedating meds   FAMILY  - Updates: spoke with pt's daughter at bedside as detailed below.   - Inter-disciplinary family meet or Palliative Care meeting due by:  09/10/14   Spoke with pt and daughter at length 7/25.  They spoke with Dr. Delton Coombes last night and began discussions regarding code status and goals of care.  We again discussed  that although pt is improving slowly and certainly does not need intubation at this time, it is important to use this time to discuss her wishes and establish goals and limitations.  They understand that given her multiple medical problems and overall deterioration at baseline that she is unlikely to have a successful outcome if she were to require mechanical ventilation.  Pt and daughter feel that she would likely not want aggressive measures including CPR, defib, intubation but want to discuss further. Pts other daughter Avon Gully) will be here later today and will continue discussions.   Dirk Dress, NP 09/04/2014  11:18 AM Pager: [phone] or [phone]  Attending Note:  Patient seen and examined, agree with above note, no need for immediate intubation at this point however, patient's health is very poor and has been deteriorating steadily.  There was no visitor bedside when I saw patient and patient clearly can not verbalized her wishes.  Respiratory failure due to PNA and pulmonary edema.  Improving compared to  yesterday however but code status still needs to be addressed.    I reviewed the CXR myself, clear RLL infiltrate.  Discussed with PCCM-NP.  Recommend palliative care consultation for goals of care.  PCCM will be available PRN.  Patient seen and examined, agree with above note.  I dictated the care and orders written for this patient under my direction.  Alyson Reedy, MD [phone]

## 2014-09-04 NOTE — Progress Notes (Signed)
Subjective: Interval History:endorses hx of poor po intake while at nursing facility b/c she does not like the food there.   Objective: Vital signs in last 24 hours: Temp:  [96.7 F (35.9 C)-98 F (36.7 C)] 97.6 F (36.4 C) (07/25 0728) Pulse Rate:  [55-100] 100 (07/25 0728) Resp:  [17-28] 28 (07/25 0728) BP: (107-161)/(36-60) 125/38 mmHg (07/25 0728) SpO2:  [92 %-100 %] 92 % (07/25 0728) FiO2 (%):  [40 %-50 %] 40 % (07/25 0224) Weight change:   Intake/Output from previous day: 07/24 0701 - 07/25 0700 In: 1340 [P.O.:240; I.V.:900; IV Piggyback:200] Out: 1555 [Urine:1555] Intake/Output this shift:   Physical Exam  Constitutional: She appears well-developed and well-nourished.  HENT:  Wearing BIPAP   Eyes: Conjunctivae are normal.  Cardiovascular: Normal rate and regular rhythm.   Pulmonary/Chest: Effort normal.  Rhonchi   Abdominal: Soft. Bowel sounds are normal. She exhibits no distension.  Musculoskeletal: She exhibits no edema.  Neurological: She is alert.  Skin: Skin is warm and dry.    Lab Results:  Recent Labs  09/03/14 0309 09/04/14 0413  WBC 14.2* 7.5  HGB 11.2* 9.3*  HCT 35.3* 29.5*  PLT 265 209   BMET:  Recent Labs  09/03/14 2122 09/04/14 0413  NA 140 145  K 3.4* 3.4*  CL 98* 103  CO2 34* 32  GLUCOSE 151* 264*  BUN 38* 38*  CREATININE 1.52* 1.64*  CALCIUM 8.9 8.8*   No results for input(s): PTH in the last 72 hours. Iron Studies: No results for input(s): IRON, TIBC, TRANSFERRIN, FERRITIN in the last 72 hours. CBG (last 3)   Recent Labs  09/03/14 1654 09/03/14 2218 09/04/14 0447  GLUCAP 163* 119* 255*     Studies/Results: Ct Head Wo Contrast  09/03/2014   CLINICAL DATA:  Altered mental status  EXAM: CT HEAD WITHOUT CONTRAST  TECHNIQUE: Contiguous axial images were obtained from the base of the skull through the vertex without intravenous contrast.  COMPARISON:  Head CT August 24, 2008; brain MRI August 24, 2008  FINDINGS: Moderate  diffuse atrophy is stable. There is no intracranial mass, hemorrhage, extra-axial fluid collection, or midline shift. There is small vessel disease throughout the centra semiovale bilaterally, most pronounced superiorly and posteriorly on the left. There is evidence of a prior infarct involving the tail of the caudate nucleus on the left as well as a portion of the left thalamus. There is decreased attenuation in the right thalamus which was not present previously and is consistent with an age uncertain infarct. There is also decreased attenuation in the inferior cerebellum on the right slightly inferior to the right dentate nucleus. This area has an appearance concerning for recent infarct. Bony calvarium appears intact. Mastoid air cells are clear.  IMPRESSION: Atrophy with multiple areas of prior infarct. Suspect recent infarct in the right cerebellum slightly inferior to the right dentate nucleus. Age uncertain infarct in the right thalamus. Chronic infarct left thalamus as well as in the tail of the caudate nucleus on the left. Extensive periventricular small vessel disease present. No hemorrhage or mass effect.   Electronically Signed   By: Bretta Bang III M.D.   On: 09/03/2014 16:08   US Renal Port  09/04/2014   CLINICAL DATA:  Oliguria.  EXAM: RENAL / URINARY TRACT ULTRASOUND COMPLETE  COMPARISON:  None.  FINDINGS: Right Kidney:  Length: 8.6 cm. There is thinning of the renal parenchyma and increased renal echogenicity. Technically limited evaluation of the right kidney limiting detailed evaluation.  Left Kidney:  Length: 9.3 cm. Echogenicity within normal limits. No mass or hydronephrosis visualized.  Bladder:  Decompressed by Foley catheter not evaluated.  IMPRESSION: 1. Right kidney appears atrophic with thinning and increased echogenicity of the renal parenchyma, suggestive of chronic renal disease. Cortical thickness of the left kidney is preserved. 2. No hydronephrosis. 3. Urinary bladder  decompressed by Foley catheter.   Electronically Signed   By: Rubye Oaks M.D.   On: 09/04/2014 02:43   Dg Chest Port 1 View  09/03/2014   CLINICAL DATA:  Acute onset of shortness of breath. Initial encounter.  EXAM: PORTABLE CHEST - 1 VIEW  COMPARISON:  Chest radiograph performed 08/07/2014  FINDINGS: The lungs are hypoexpanded. Bibasilar airspace opacity could reflect pneumonia. No pleural effusion or pneumothorax is seen.  The cardiomediastinal silhouette is mildly enlarged. No acute osseous abnormalities are identified.  IMPRESSION: Lungs hypoexpanded. Bibasilar airspace opacities could reflect pneumonia. Mild cardiomegaly.   Electronically Signed   By: Roanna Raider M.D.   On: 09/03/2014 04:33    Prior to Admission:  Prescriptions prior to admission  Medication Sig Dispense Refill Last Dose  . albuterol (PROVENTIL HFA;VENTOLIN HFA) 108 (90 BASE) MCG/ACT inhaler Inhale 2 puffs into the lungs every 6 (six) hours as needed for shortness of breath.   09/03/2014 at Unknown time  . ALPRAZolam (XANAX) 0.25 MG tablet Take 0.25 mg by mouth 2 (two) times daily as needed for anxiety.   09/02/2014 at Unknown time  . amLODipine (NORVASC) 10 MG tablet Take 10 mg by mouth daily.   09/02/2014 at Unknown time  . atorvastatin (LIPITOR) 10 MG tablet Take 10 mg by mouth daily.   09/02/2014 at Unknown time  . Cholecalciferol 1000 UNITS capsule Take 1,000 Units by mouth 2 (two) times daily.   09/02/2014 at Unknown time  . docusate sodium (COLACE) 100 MG capsule Take 100 mg by mouth 2 (two) times daily.   09/02/2014 at Unknown time  . DULoxetine (CYMBALTA) 60 MG capsule Take 60 mg by mouth daily.   09/02/2014 at Unknown time  . ferrous sulfate 325 (65 FE) MG tablet Take 325 mg by mouth 3 (three) times daily.   09/02/2014 at Unknown time  . fluticasone (FLONASE) 50 MCG/ACT nasal spray Place 2 sprays into both nostrils 2 (two) times daily.    09/02/2014 at Unknown time  . furosemide (LASIX) 20 MG tablet Take 20-40 mg by  mouth 2 (two) times daily. Take 40mg  in the morning and 20mg  midday.   09/02/2014 at Unknown time  . glimepiride (AMARYL) 2 MG tablet Take 2 mg by mouth daily with breakfast.   09/02/2014 at Unknown time  . guaifenesin (ROBITUSSIN) 100 MG/5ML syrup Take 300 mg by mouth every 4 (four) hours as needed for cough.   unk  . hydrALAZINE (APRESOLINE) 100 MG tablet Take 100 mg by mouth 3 (three) times daily.   09/02/2014 at Unknown time  . insulin detemir (LEVEMIR) 100 UNIT/ML injection Inject 8-10 Units into the skin See admin instructions. Take 8 units at bedtime. Also take 10 units in the AM   09/02/2014 at Unknown time  . insulin lispro (HUMALOG) 100 UNIT/ML injection Inject 0-7 Units into the skin daily as needed for high blood sugar. 120=0 units 120-150=3 units 151-175=5 units >176=7 units   09/02/2014 at Unknown time  . ipratropium-albuterol (DUONEB) 0.5-2.5 (3) MG/3ML SOLN Take 3 mLs by nebulization every 6 (six) hours as needed. For shortness of breath   09/02/2014 at Unknown time  .  lansoprazole (PREVACID) 30 MG capsule Take 30 mg by mouth daily at 12 noon.   09/03/2014 at Unknown time  . loratadine (CLARITIN) 10 MG tablet Take 10 mg by mouth daily as needed for allergies.   unk  . mesalamine (LIALDA) 1.2 G EC tablet Take 2.4 g by mouth every evening.   09/02/2014 at Unknown time  . Omega-3 Fatty Acids (FISH OIL) 1000 MG CAPS Take 1 capsule by mouth 2 (two) times daily.   09/02/2014 at Unknown time  . phenol (CHLORASEPTIC) 1.4 % LIQD Use as directed 1 spray in the mouth or throat every 2 (two) hours as needed for throat irritation / pain.   unk  . potassium chloride (MICRO-K) 10 MEQ CR capsule Take 20 mEq by mouth daily.    09/02/2014 at Unknown time  . Propylene Glycol (SYSTANE BALANCE) 0.6 % SOLN Place 1 drop into both eyes 2 (two) times daily.   09/02/2014 at Unknown time  . sennosides-docusate sodium (SENOKOT-S) 8.6-50 MG tablet Take 1 tablet by mouth 2 (two) times daily.   09/02/2014 at Unknown time  .  traMADol (ULTRAM) 50 MG tablet Take 50 mg by mouth 2 (two) times daily as needed for moderate pain.   09/02/2014 at Unknown time  . vitamin B-12 (CYANOCOBALAMIN) 500 MCG tablet Take 500 mcg by mouth daily.   09/02/2014 at Unknown time   Scheduled: . amLODipine  10 mg Oral Daily  . atorvastatin  10 mg Oral Daily  . dextromethorphan-guaiFENesin  1 tablet Oral BID  . docusate sodium  100 mg Oral BID  . enoxaparin (LOVENOX) injection  40 mg Subcutaneous Q24H  . hydrALAZINE  100 mg Oral 3 times per day  . insulin aspart  0-9 Units Subcutaneous TID WC  . ipratropium-albuterol  3 mL Nebulization Q6H  . piperacillin-tazobactam (ZOSYN)  IV  3.375 g Intravenous 3 times per day  . sodium chloride  10-40 mL Intracatheter Q12H  . sodium polystyrene  60 g Rectal Once  . vancomycin  500 mg Intravenous Q24H   Continuous: . dextrose 5 % and 0.45% NaCl 100 mL/hr at 09/03/14 2259    Assessment/Plan: 1 Acute on chronic kidney disease stage 3- FENa 1.6, likely ATN from pre renal cause in setting of poor po intake and diuretic.Good urine output.  - mild bump in creatinine, continue w/ D5 1/2 NS at 100cc/hr. Volume depleted on exam. Will follow chem panels.  - urine protein/cr 0.63, non nephrotic - renal u/s positive for atrophic right kidney.  - SPEP/UPEP pending 2 Hyperkalemia- s/p kayexalate. Resolved, K now 3.4 3.Marland Kitchen Respiratory distress- PNA vs CHF vs COPD exacerbation. On vanc and zosyn. On BIPAP, would hold all sedating meds including xanax and cymbalta. PCCM following.  5. HTN 4. Abnormal chest xray- on review, pt has poor inspiration w/ elevated rt hemidiaphragm concerning for rt lower lung collapse from questionable malignancy or mucous plugging. Per family she is scheduled to have a CT chest in the future.  6. DM   LOS: 1 day   Gara Kroner 09/04/2014,7:42 AM

## 2014-09-05 ENCOUNTER — Inpatient Hospital Stay: Admission: RE | Admit: 2014-09-05 | Payer: Medicare Other | Source: Ambulatory Visit

## 2014-09-05 ENCOUNTER — Inpatient Hospital Stay (HOSPITAL_COMMUNITY): Payer: Medicare Other

## 2014-09-05 DIAGNOSIS — I6340 Cerebral infarction due to embolism of unspecified cerebral artery: Secondary | ICD-10-CM

## 2014-09-05 DIAGNOSIS — R400 Somnolence: Secondary | ICD-10-CM

## 2014-09-05 LAB — FERRITIN: FERRITIN: 33 ng/mL (ref 11–307)

## 2014-09-05 LAB — UIFE/LIGHT CHAINS/TP QN, 24-HR UR
% BETA, Urine: 10.1 %
ALPHA 1 URINE: 1.3 %
ALPHA 2 UR: 6.9 %
Albumin, U: 74.7 %
FREE LT CHN EXCR RATE: 42.3 mg/L — AB (ref 1.35–24.19)
Free Kappa/Lambda Ratio: 9.84 (ref 2.04–10.37)
Free Lambda Lt Chains,Ur: 4.3 mg/L (ref 0.24–6.66)
GAMMA GLOBULIN URINE: 6.9 %
TOTAL PROTEIN, URINE-UPE24: 29.5 mg/dL

## 2014-09-05 LAB — COMPREHENSIVE METABOLIC PANEL
ALT: 9 U/L — ABNORMAL LOW (ref 14–54)
ANION GAP: 6 (ref 5–15)
AST: 11 U/L — ABNORMAL LOW (ref 15–41)
Albumin: 1.9 g/dL — ABNORMAL LOW (ref 3.5–5.0)
Alkaline Phosphatase: 42 U/L (ref 38–126)
BUN: 27 mg/dL — ABNORMAL HIGH (ref 6–20)
CHLORIDE: 104 mmol/L (ref 101–111)
CO2: 30 mmol/L (ref 22–32)
CREATININE: 1.24 mg/dL — AB (ref 0.44–1.00)
Calcium: 7.9 mg/dL — ABNORMAL LOW (ref 8.9–10.3)
GFR, EST AFRICAN AMERICAN: 46 mL/min — AB (ref >60)
GFR, EST NON AFRICAN AMERICAN: 39 mL/min — AB (ref >60)
Glucose, Bld: 232 mg/dL — ABNORMAL HIGH (ref 65–99)
Potassium: 3.2 mmol/L — ABNORMAL LOW (ref 3.5–5.1)
SODIUM: 140 mmol/L (ref 135–145)
TOTAL PROTEIN: 4 g/dL — AB (ref 6.5–8.1)
Total Bilirubin: 1.3 mg/dL — ABNORMAL HIGH (ref 0.3–1.2)

## 2014-09-05 LAB — PROTEIN ELECTROPHORESIS, SERUM
A/G Ratio: 1 (ref 0.7–1.7)
ALBUMIN ELP: 2.5 g/dL — AB (ref 2.9–4.4)
ALPHA-1-GLOBULIN: 0.4 g/dL (ref 0.0–0.4)
Alpha-2-Globulin: 0.9 g/dL (ref 0.4–1.0)
Beta Globulin: 0.8 g/dL (ref 0.7–1.3)
GLOBULIN, TOTAL: 2.5 g/dL (ref 2.2–3.9)
Gamma Globulin: 0.4 g/dL (ref 0.4–1.8)
TOTAL PROTEIN ELP: 5 g/dL — AB (ref 6.0–8.5)

## 2014-09-05 LAB — CBC WITH DIFFERENTIAL/PLATELET
BASOS PCT: 0 % (ref 0–1)
Basophils Absolute: 0 10*3/uL (ref 0.0–0.1)
EOS ABS: 0.3 10*3/uL (ref 0.0–0.7)
Eosinophils Relative: 6 % — ABNORMAL HIGH (ref 0–5)
HCT: 23.8 % — ABNORMAL LOW (ref 36.0–46.0)
Hemoglobin: 7.7 g/dL — ABNORMAL LOW (ref 12.0–15.0)
LYMPHS ABS: 0.6 10*3/uL — AB (ref 0.7–4.0)
Lymphocytes Relative: 11 % — ABNORMAL LOW (ref 12–46)
MCH: 33.6 pg (ref 26.0–34.0)
MCHC: 32.4 g/dL (ref 30.0–36.0)
MCV: 103.9 fL — AB (ref 78.0–100.0)
Monocytes Absolute: 0.6 10*3/uL (ref 0.1–1.0)
Monocytes Relative: 11 % (ref 3–12)
NEUTROS ABS: 3.7 10*3/uL (ref 1.7–7.7)
Neutrophils Relative %: 72 % (ref 43–77)
Platelets: 179 10*3/uL (ref 150–400)
RBC: 2.29 MIL/uL — AB (ref 3.87–5.11)
RDW: 14.4 % (ref 11.5–15.5)
WBC: 5.3 10*3/uL (ref 4.0–10.5)

## 2014-09-05 LAB — RETICULOCYTES
RBC.: 2.71 MIL/uL — ABNORMAL LOW (ref 3.87–5.11)
Retic Count, Absolute: 89.4 10*3/uL (ref 19.0–186.0)
Retic Ct Pct: 3.3 % — ABNORMAL HIGH (ref 0.4–3.1)

## 2014-09-05 LAB — BLOOD GAS, ARTERIAL
ACID-BASE EXCESS: 5.7 mmol/L — AB (ref 0.0–2.0)
BICARBONATE: 30.6 meq/L — AB (ref 20.0–24.0)
Drawn by: 129711
O2 CONTENT: 6 L/min
O2 Saturation: 88.6 %
PCO2 ART: 52.5 mmHg — AB (ref 35.0–45.0)
PH ART: 7.383 (ref 7.350–7.450)
Patient temperature: 98.6
TCO2: 32.2 mmol/L (ref 0–100)
pO2, Arterial: 56.4 mmHg — ABNORMAL LOW (ref 80.0–100.0)

## 2014-09-05 LAB — GLUCOSE, CAPILLARY
GLUCOSE-CAPILLARY: 136 mg/dL — AB (ref 65–99)
GLUCOSE-CAPILLARY: 149 mg/dL — AB (ref 65–99)
Glucose-Capillary: 103 mg/dL — ABNORMAL HIGH (ref 65–99)
Glucose-Capillary: 122 mg/dL — ABNORMAL HIGH (ref 65–99)
Glucose-Capillary: 196 mg/dL — ABNORMAL HIGH (ref 65–99)
Glucose-Capillary: 269 mg/dL — ABNORMAL HIGH (ref 65–99)

## 2014-09-05 LAB — IRON AND TIBC
Iron: 39 ug/dL (ref 28–170)
Saturation Ratios: 18 % (ref 10.4–31.8)
TIBC: 216 ug/dL — ABNORMAL LOW (ref 250–450)
UIBC: 177 ug/dL

## 2014-09-05 LAB — URINE CULTURE: CULTURE: NO GROWTH

## 2014-09-05 LAB — FOLATE: Folate: 11.9 ng/mL (ref >5.9)

## 2014-09-05 LAB — MAGNESIUM: Magnesium: 1.7 mg/dL (ref 1.7–2.4)

## 2014-09-05 LAB — VITAMIN B12: VITAMIN B 12: 715 pg/mL (ref 180–914)

## 2014-09-05 LAB — PREPARE RBC (CROSSMATCH)

## 2014-09-05 MED ORDER — FUROSEMIDE 10 MG/ML IJ SOLN
60.0000 mg | Freq: Two times a day (BID) | INTRAMUSCULAR | Status: DC
  Administered 2014-09-05 – 2014-09-07 (×6): 60 mg via INTRAVENOUS
  Filled 2014-09-05 (×7): qty 6

## 2014-09-05 MED ORDER — POTASSIUM CHLORIDE 10 MEQ/100ML IV SOLN
10.0000 meq | INTRAVENOUS | Status: AC
  Administered 2014-09-05 (×3): 10 meq via INTRAVENOUS
  Filled 2014-09-05 (×3): qty 100

## 2014-09-05 MED ORDER — MAGNESIUM SULFATE 2 GM/50ML IV SOLN
2.0000 g | Freq: Once | INTRAVENOUS | Status: AC
  Administered 2014-09-05: 2 g via INTRAVENOUS
  Filled 2014-09-05: qty 50

## 2014-09-05 MED ORDER — SODIUM CHLORIDE 0.9 % IV SOLN: Freq: Once | INTRAVENOUS | Status: DC

## 2014-09-05 MED ORDER — ENSURE ENLIVE PO LIQD
237.0000 mL | Freq: Two times a day (BID) | ORAL | Status: DC
  Administered 2014-09-05 – 2014-09-12 (×13): 237 mL via ORAL

## 2014-09-05 MED ORDER — POTASSIUM CHLORIDE 10 MEQ/100ML IV SOLN: 10.0000 meq | INTRAVENOUS | Status: DC

## 2014-09-05 MED ORDER — MAGNESIUM SULFATE 2 GM/50ML IV SOLN
2.0000 g | Freq: Once | INTRAVENOUS | Status: DC
  Filled 2014-09-05 (×2): qty 50

## 2014-09-05 NOTE — Progress Notes (Signed)
Pt took off bipap mask.  RN entered the room and pt requested [place].  Pt stated she feels like she's going to die with the Bipap on.  I spoke with her about being a full code and explained intubation and compressions.  Pt verbalized understanding and stated she wants all those things done if something were to happen.  RN wanted to ensure pt understood her options.

## 2014-09-05 NOTE — Progress Notes (Addendum)
Pt currently off BIPAP and tolerating well at time.  Pt in no noted distress at this time, RT to monitor and assess as needed.

## 2014-09-05 NOTE — Care Management Important Message (Signed)
Important Message  Patient Details  Name: Michelle Bowers MRN: [id number] Date of Birth: 1931/01/23   Medicare Important Message Given:  Yes-second notification given    Kyla Balzarine 09/05/2014, 1:07 PMImportant Message  Patient Details  Name: Michelle Bowers MRN: [id number] Date of Birth: 1930/04/06   Medicare Important Message Given:  Yes-second notification given    Kyla Balzarine 09/05/2014, 1:07 PM

## 2014-09-05 NOTE — Progress Notes (Signed)
Subjective: Interval History: resting comfortably   Objective: Vital signs in last 24 hours: Temp:  [96.5 F (35.8 C)-97.8 F (36.6 C)] 96.5 F (35.8 C) (07/26 0739) Pulse Rate:  [58-100] 90 (07/26 0739) Resp:  [15-33] 25 (07/26 0739) BP: (105-159)/(28-50) 159/50 mmHg (07/26 0739) SpO2:  [93 %-100 %] 95 % (07/26 1000) FiO2 (%):  [40 %-50 %] 40 % (07/26 0739) Weight:  [123 lb (55.792 kg)] 123 lb (55.792 kg) (07/26 0000) Weight change:   Intake/Output from previous day: 07/25 0701 - 07/26 0700 In: 1945 [P.O.:360; I.V.:1535; IV Piggyback:50] Out: 1025 [Urine:1025] Intake/Output this shift:   Physical Exam  Constitutional: She appears well-developed and well-nourished.  HENT:  Wearing BIPAP   Eyes: Conjunctivae are normal.  Cardiovascular: Normal rate and regular rhythm.   Pulmonary/Chest: Effort normal.  Rhonchi   Abdominal: Soft. Bowel sounds are normal. She exhibits no distension.  Musculoskeletal: She exhibits no edema.  Neurological: She is alert.  Skin: Skin is warm and dry.    Lab Results:  Recent Labs  09/04/14 0413 09/05/14 0612  WBC 7.5 5.3  HGB 9.3* 7.7*  HCT 29.5* 23.8*  PLT 209 179   BMET:   Recent Labs  09/04/14 0413 09/05/14 0612  NA 145 140  K 3.4* 3.2*  CL 103 104  CO2 32 30  GLUCOSE 264* 232*  BUN 38* 27*  CREATININE 1.64* 1.24*  CALCIUM 8.8* 7.9*   No results for input(s): PTH in the last 72 hours. Iron Studies: No results for input(s): IRON, TIBC, TRANSFERRIN, FERRITIN in the last 72 hours. CBG (last 3)   Recent Labs  09/05/14 0013 09/05/14 0414 09/05/14 0739  GLUCAP 103* 122* 136*     Studies/Results: Ct Head Wo Contrast  09/03/2014   CLINICAL DATA:  Altered mental status  EXAM: CT HEAD WITHOUT CONTRAST  TECHNIQUE: Contiguous axial images were obtained from the base of the skull through the vertex without intravenous contrast.  COMPARISON:  Head CT August 24, 2008; brain MRI August 24, 2008  FINDINGS: Moderate diffuse  atrophy is stable. There is no intracranial mass, hemorrhage, extra-axial fluid collection, or midline shift. There is small vessel disease throughout the centra semiovale bilaterally, most pronounced superiorly and posteriorly on the left. There is evidence of a prior infarct involving the tail of the caudate nucleus on the left as well as a portion of the left thalamus. There is decreased attenuation in the right thalamus which was not present previously and is consistent with an age uncertain infarct. There is also decreased attenuation in the inferior cerebellum on the right slightly inferior to the right dentate nucleus. This area has an appearance concerning for recent infarct. Bony calvarium appears intact. Mastoid air cells are clear.  IMPRESSION: Atrophy with multiple areas of prior infarct. Suspect recent infarct in the right cerebellum slightly inferior to the right dentate nucleus. Age uncertain infarct in the right thalamus. Chronic infarct left thalamus as well as in the tail of the caudate nucleus on the left. Extensive periventricular small vessel disease present. No hemorrhage or mass effect.   Electronically Signed   By: Bretta Bang III M.D.   On: 09/03/2014 16:08   US Renal Port  09/04/2014   CLINICAL DATA:  Oliguria.  EXAM: RENAL / URINARY TRACT ULTRASOUND COMPLETE  COMPARISON:  None.  FINDINGS: Right Kidney:  Length: 8.6 cm. There is thinning of the renal parenchyma and increased renal echogenicity. Technically limited evaluation of the right kidney limiting detailed evaluation.  Left Kidney:  Length: 9.3 cm. Echogenicity within normal limits. No mass or hydronephrosis visualized.  Bladder:  Decompressed by Foley catheter not evaluated.  IMPRESSION: 1. Right kidney appears atrophic with thinning and increased echogenicity of the renal parenchyma, suggestive of chronic renal disease. Cortical thickness of the left kidney is preserved. 2. No hydronephrosis. 3. Urinary bladder decompressed  by Foley catheter.   Electronically Signed   By: Rubye Oaks M.D.   On: 09/04/2014 02:43   Dg Chest Port 1 View  09/04/2014   CLINICAL DATA:  Pulmonary edema  EXAM: PORTABLE CHEST - 1 VIEW  COMPARISON:  09/03/2014  FINDINGS: Moderate pleural effusion bilaterally unchanged with compressive atelectasis in both lung bases. Mild vascular congestion  Left arm PICC tip has been placed with the tip in the right atrium. Recommend withdrawal of 4 cm.  IMPRESSION: PICC tip in the right atrium, recommend withdrawal 4 cm  Bibasilar atelectasis and effusion unchanged.   Electronically Signed   By: Marlan Palau M.D.   On: 09/04/2014 13:50    Prior to Admission:  Prescriptions prior to admission  Medication Sig Dispense Refill Last Dose  . albuterol (PROVENTIL HFA;VENTOLIN HFA) 108 (90 BASE) MCG/ACT inhaler Inhale 2 puffs into the lungs every 6 (six) hours as needed for shortness of breath.   09/03/2014 at Unknown time  . ALPRAZolam (XANAX) 0.25 MG tablet Take 0.25 mg by mouth 2 (two) times daily as needed for anxiety.   09/02/2014 at Unknown time  . amLODipine (NORVASC) 10 MG tablet Take 10 mg by mouth daily.   09/02/2014 at Unknown time  . atorvastatin (LIPITOR) 10 MG tablet Take 10 mg by mouth daily.   09/02/2014 at Unknown time  . Cholecalciferol 1000 UNITS capsule Take 1,000 Units by mouth 2 (two) times daily.   09/02/2014 at Unknown time  . docusate sodium (COLACE) 100 MG capsule Take 100 mg by mouth 2 (two) times daily.   09/02/2014 at Unknown time  . DULoxetine (CYMBALTA) 60 MG capsule Take 60 mg by mouth daily.   09/02/2014 at Unknown time  . ferrous sulfate 325 (65 FE) MG tablet Take 325 mg by mouth 3 (three) times daily.   09/02/2014 at Unknown time  . fluticasone (FLONASE) 50 MCG/ACT nasal spray Place 2 sprays into both nostrils 2 (two) times daily.    09/02/2014 at Unknown time  . furosemide (LASIX) 20 MG tablet Take 20-40 mg by mouth 2 (two) times daily. Take 40mg  in the morning and 20mg  midday.    09/02/2014 at Unknown time  . glimepiride (AMARYL) 2 MG tablet Take 2 mg by mouth daily with breakfast.   09/02/2014 at Unknown time  . guaifenesin (ROBITUSSIN) 100 MG/5ML syrup Take 300 mg by mouth every 4 (four) hours as needed for cough.   unk  . hydrALAZINE (APRESOLINE) 100 MG tablet Take 100 mg by mouth 3 (three) times daily.   09/02/2014 at Unknown time  . insulin detemir (LEVEMIR) 100 UNIT/ML injection Inject 8-10 Units into the skin See admin instructions. Take 8 units at bedtime. Also take 10 units in the AM   09/02/2014 at Unknown time  . insulin lispro (HUMALOG) 100 UNIT/ML injection Inject 0-7 Units into the skin daily as needed for high blood sugar. 120=0 units 120-150=3 units 151-175=5 units >176=7 units   09/02/2014 at Unknown time  . ipratropium-albuterol (DUONEB) 0.5-2.5 (3) MG/3ML SOLN Take 3 mLs by nebulization every 6 (six) hours as needed. For shortness of breath   09/02/2014 at Unknown time  .  lansoprazole (PREVACID) 30 MG capsule Take 30 mg by mouth daily at 12 noon.   09/03/2014 at Unknown time  . loratadine (CLARITIN) 10 MG tablet Take 10 mg by mouth daily as needed for allergies.   unk  . mesalamine (LIALDA) 1.2 G EC tablet Take 2.4 g by mouth every evening.   09/02/2014 at Unknown time  . Omega-3 Fatty Acids (FISH OIL) 1000 MG CAPS Take 1 capsule by mouth 2 (two) times daily.   09/02/2014 at Unknown time  . phenol (CHLORASEPTIC) 1.4 % LIQD Use as directed 1 spray in the mouth or throat every 2 (two) hours as needed for throat irritation / pain.   unk  . potassium chloride (MICRO-K) 10 MEQ CR capsule Take 20 mEq by mouth daily.    09/02/2014 at Unknown time  . Propylene Glycol (SYSTANE BALANCE) 0.6 % SOLN Place 1 drop into both eyes 2 (two) times daily.   09/02/2014 at Unknown time  . sennosides-docusate sodium (SENOKOT-S) 8.6-50 MG tablet Take 1 tablet by mouth 2 (two) times daily.   09/02/2014 at Unknown time  . traMADol (ULTRAM) 50 MG tablet Take 50 mg by mouth 2 (two) times daily  as needed for moderate pain.   09/02/2014 at Unknown time  . vitamin B-12 (CYANOCOBALAMIN) 500 MCG tablet Take 500 mcg by mouth daily.   09/02/2014 at Unknown time   Scheduled: . amLODipine  10 mg Oral Daily  . atorvastatin  20 mg Oral Daily  . dextromethorphan-guaiFENesin  1 tablet Oral BID  . docusate sodium  100 mg Oral BID  . DULoxetine  60 mg Oral Daily  . enoxaparin (LOVENOX) injection  40 mg Subcutaneous Q24H  . feeding supplement (ENSURE ENLIVE)  237 mL Oral BID BM  . furosemide  60 mg Intravenous BID  . hydrALAZINE  100 mg Oral 3 times per day  . insulin aspart  0-20 Units Subcutaneous 6 times per day  . insulin glargine  5 Units Subcutaneous Daily  . ipratropium-albuterol  3 mL Nebulization Q6H  . magnesium sulfate 1 - 4 g bolus IVPB  2 g Intravenous Once  . piperacillin-tazobactam (ZOSYN)  IV  3.375 g Intravenous 3 times per day  . sodium chloride  10-40 mL Intracatheter Q12H  . sodium polystyrene  60 g Rectal Once  . vancomycin  500 mg Intravenous Q24H   Continuous: . dextrose 5 % and 0.45% NaCl 10 mL/hr at 09/05/14 1034    Assessment/Plan: 1 Acute on chronic kidney disease stage 3- FENa 1.6, likely ATN from pre renal cause in setting of poor po intake and diuretic.Good urine output with improving creatinine and renal function.  - low K, 3.2 repleted  W/ 3 runs of KCL - SPEP/UPEP pending - Nephrology will sign off at this time.  2 Hyperkalemia- s/p kayexalate. Resolved 3.. Respiratory distress- PNA vs CHF vs COPD exacerbation. On vanc and zosyn. On BIPAP, would hold all sedating meds including xanax and cymbalta. PCCM following.  5. HTN 4. Abnormal chest xray- on review, pt has poor inspiration w/ elevated rt hemidiaphragm concerning for rt lower lung collapse from questionable malignancy or mucous plugging. Per family she is scheduled to have a CT chest in the future.  6. DM   LOS: 2 days   Gara Kroner 09/05/2014,11:07 AM

## 2014-09-05 NOTE — Progress Notes (Addendum)
VASCULAR LAB PRELIMINARY  PRELIMINARY  PRELIMINARY  PRELIMINARY  Carotid duplex completed.    Preliminary report:  Right -  1-39% ICA stenosis.  There is a right ECA stenosis. Left - 40% to 59% ICA stenosis. Vertebral artery flow is antegrade.     Ikaika Showers, [place], RVS 09/05/2014, 9:55 AM

## 2014-09-05 NOTE — Progress Notes (Addendum)
[place] TEAM 1 - Stepdown/ICU TEAM Progress Note  [place] Bhatt FAO:[id number] DOB: 10-24-1930 DOA: 09/03/2014 PCP: Julian Hy, MD  Admit HPI / Brief Narrative: 81 y.o. WF PMHx Depression, CVA, HTN, CHF DM type II, COPD, presenting with cc of dyspnea. Patient is currently on Bipap and can't provide history. No family at bedside ; she is a resident at [facility]. History taking from ER physician. She reported orthopnea. She had a pneumonia three weeks ago and she reported that her symptoms now are similar to her symptoms from 3 weeks ago. No chest pain.   HPI/Subjective: 7/27  A/O  patient on BiPAP, appears to understand questions and nods yes and no. Waxing and waning periods of increased WOB/SOB. Patient is a 1 PPD 50 years smoker (stopped July 2010).    Assessment/Plan: Acute diastolic HF:  -7/24 PCXR; bibasilar opacification see results below. -Elevated BNP possibly secondary to AKI.  -Echocardiogram; consistent with diastolic CHF  -Transfuse for hemoglobin<8  Pulmonary edema? -Patient with increased rhonchi especially in RLL  -Decrease D5W-0.45% saline to [facility] -7/25 PCXR; consistent with increasing pulmonary edema -7/27 CVP= 8 -Continue Lasix 60 mg BID  Sepsis due to HCAP?/Aspiration pneumonia?  -Continue Vancomycin and Zosyn; complete 7 day course  -Sputum pending -DuoNeb QID -Mucinex DM BID  Acute respiratory failure with Hypercapnia   -Likely multifactorial due to Pneumonia vs. Acute HF vs. COPD exacerbation; troponin 2 negative  -Continue  BiPAP and oxygen therapy PRN  -Continue albuterol Q4H  Anemia -Most likely multifactorial to include sepsis, dilutional, underproduction.  -Obtain anemia panel, haptoglobin, LDH -7/26 Type and cross and transfuse 1 unit PRBC   Decreasing platelets (sepsis vs hit?) -Recovering -DC all heparin containing products -Hit panel pending -  Hypokalemia -Potassium goal> 4 -Potassium IV 10 mg  3  Hypomagnesemia -Magnesium goal> 2 -Magnesium IV 2 gm  DM Type II uncontrolled -7/24 Hemoglobin A1c= 7.5  -Increase to resistant SSI -Continue Lantus 5 mg daily  HLD -Lipid panel; slightly outside ADA guidelines -Increase Lipitor to 20 mg daily  Acute on chronic kidney failure (Cr Baseline ~ 1.3).  -Due to HF vs. Sepsis -Cr baseline  Anxiety -continue Ativan 0.25 mg daily PRN   -Continue  Cymbalta 60 mg daily  Altered mental status -Most likely multifactorial to include sepsis, acute/subacute stroke; appears to be clearing  Acute stroke on chronic strokes -7/24 CT head shows probable acute stroke with multiple chronic stroke. See results below -Allow for permissive HTN -Carotid Dopplers pending -MRI brain pending, MRI head on hold secondary to patient being unable to lay flat for study       Code Status: FULL Family Communication: Spoke with sister-in-law .  Disposition Plan: Resolution sepsis/completion of stroke workup    Consultants: Dr. Pasty Arch (PCCM)    Procedure/Significant Events: 7/24 PCXR; poor respiratory effort resulted in hyperexpansion, bibasilar airspace opacification to my: Pneumonia? 7/24 CT head without contrast; Atrophy with multiple areas of prior infarct.  -Suspect recent infarct Rt cerebellum slightly inferior to the right dentate nucleus.  -Age uncertain infarct Rt thalamus. -Chronic infarct Lt thalamus as well as in the tail of the caudate nucleus 7/25 renal ultrasound;-Rt kidney atrophic w/ thinning and increased echogenicity renal parenchyma, suggestive of chronic renal Dz 7/25 echocardiogram;- Left ventricle: severe LVH. -LVEF= 60%- 65%.  -Dynamic obstruction. Dynamic Obstruction, with a peak gradient of 78 mm Hg.-Relaxation (grade 1 diastolic dysfunction).    Culture 7/24 MRSA by PCR negative 7/24 blood left/right arm NGTD 7/24 urine pending 7/24  strep pneumo/Legionella urine antigen negative   Antibiotics: Zosyn  7/24>> Vancomycin 7/24>>   DVT prophylaxis: Lovenox   Devices    LINES / TUBES:      Continuous Infusions: . dextrose 5 % and 0.45% NaCl 50 mL/hr at 09/04/14 2200    Objective: VITAL SIGNS: Temp: 96.5 F (35.8 C) (07/26 0739) Temp Source: Axillary (07/26 0739) BP: 159/50 mmHg (07/26 0739) Pulse Rate: 90 (07/26 0739) SPO2; FIO2:   Intake/Output Summary (Last 24 hours) at 09/05/14 0836 Last data filed at 09/05/14 0700  Gross per 24 hour  Intake   1945 ml  Output   1025 ml  Net    920 ml     Exam: General: Alert but back on BIPAP  Eyes: Pupils sluggishly reactive to light    ENT: negative gingival bleeding Neck:  Negative scars, masses, torticollis, lymphadenopathy, JVD Lungs: poor inspiratory effort, decreased breath sounds diffusely, negative expiratory wheezing, bibasilar crackles.  Cardiovascular: Regular rate and rhythm without murmur gallop or rub normal S1 and S2 Abdomen: Soft, nontender, plus bowel sounds  Extremities: No significant cyanosis, clubbing, or edema bilateral lower extremities Psychiatric:  Unable to assess  Neurologic:  Follows some commands right sided hemiparesis,   Data Reviewed: Basic Metabolic Panel:  Recent Labs Lab 09/03/14 0309  09/03/14 0918 09/03/14 1330 09/03/14 2122 09/04/14 0413 09/05/14 0612  NA 138  --   --  146* 140 145 140  K 6.3*  < > 5.6* 5.3* 3.4* 3.4* 3.2*  CL 100*  --   --  106 98* 103 104  CO2 29  --   --  31 34* 32 30  GLUCOSE 173*  --   --  188* 151* 264* 232*  BUN 42*  --   --  41* 38* 38* 27*  CREATININE 1.52*  --   --  1.42* 1.52* 1.64* 1.24*  CALCIUM 9.3  --   --  9.8 8.9 8.8* 7.9*  MG  --   --  2.3  --  2.3 2.1 1.7  < > = values in this interval not displayed. Liver Function Tests:  Recent Labs Lab 09/03/14 0309 09/04/14 0413 09/05/14 0612  AST 14* 10* 11*  ALT 12* 11* 9*  ALKPHOS 73 57 42  BILITOT 0.5 0.8 1.3*  PROT 6.0* 4.9* 4.0*  ALBUMIN 3.0* 2.3* 1.9*   No results for input(s):  LIPASE, AMYLASE in the last 168 hours. No results for input(s): AMMONIA in the last 168 hours. CBC:  Recent Labs Lab 09/03/14 0309 09/04/14 0413 09/05/14 0612  WBC 14.2* 7.5 5.3  NEUTROABS 12.5* 6.0 3.7  HGB 11.2* 9.3* 7.7*  HCT 35.3* 29.5* 23.8*  MCV 105.1* 106.1* 103.9*  PLT 265 209 179   Cardiac Enzymes:  Recent Labs Lab 09/03/14 0309 09/03/14 1055  TROPONINI 0.03 0.03   BNP (last 3 results)  Recent Labs  09/03/14 0309  BNP 377.8*    ProBNP (last 3 results) No results for input(s): PROBNP in the last 8760 hours.  CBG:  Recent Labs Lab 09/04/14 1321 09/04/14 1613 09/04/14 2007 09/05/14 0013 09/05/14 0414  GLUCAP 345* 239* 217* 103* 122*    Recent Results (from the past 240 hour(s))  MRSA PCR Screening     Status: None   Collection Time: 09/03/14  8:41 AM  Result Value Ref Range Status   MRSA by PCR NEGATIVE NEGATIVE Final    Comment:        The GeneXpert MRSA Assay (FDA approved for NASAL specimens  only), is one component of a comprehensive MRSA colonization surveillance program. It is not intended to diagnose MRSA infection nor to guide or monitor treatment for MRSA infections.   Culture, blood (routine x 2)     Status: None (Preliminary result)   Collection Time: 09/03/14  9:56 AM  Result Value Ref Range Status   Specimen Description BLOOD LEFT ARM  Final   Special Requests IN PEDIATRIC BOTTLE 1CC  Final   Culture NO GROWTH 1 DAY  Final   Report Status PENDING  Incomplete  Culture, blood (routine x 2)     Status: None (Preliminary result)   Collection Time: 09/03/14 11:10 AM  Result Value Ref Range Status   Specimen Description BLOOD RIGHT ARM  Final   Special Requests BOTTLES DRAWN AEROBIC ONLY 7CC  Final   Culture NO GROWTH 1 DAY  Final   Report Status PENDING  Incomplete     Studies:  Recent x-ray studies have been reviewed in detail by the Attending Physician  Scheduled Meds:  Scheduled Meds: . amLODipine  10 mg Oral Daily   . atorvastatin  20 mg Oral Daily  . dextromethorphan-guaiFENesin  1 tablet Oral BID  . docusate sodium  100 mg Oral BID  . DULoxetine  60 mg Oral Daily  . enoxaparin (LOVENOX) injection  40 mg Subcutaneous Q24H  . hydrALAZINE  100 mg Oral 3 times per day  . insulin aspart  0-20 Units Subcutaneous 6 times per day  . insulin glargine  5 Units Subcutaneous Daily  . ipratropium-albuterol  3 mL Nebulization Q6H  . piperacillin-tazobactam (ZOSYN)  IV  3.375 g Intravenous 3 times per day  . sodium chloride  10-40 mL Intracatheter Q12H  . sodium polystyrene  60 g Rectal Once  . vancomycin  500 mg Intravenous Q24H    Time spent on care of this patient: 40 mins   WOODS, Roselind Messier , MD  [facility] Office  [phone] Pager [phone]  On-Call/Text Page:      [email or web address]      password [email or web address]  If 7PM-7AM, please contact night-coverage [email or web address] Password [facility] 09/05/2014, 8:36 AM   LOS: 2 days   Care during the described time interval was provided by me .  I have reviewed this patient's available data, including medical history, events of note, physical examination, and all test results as part of my evaluation. I have personally reviewed and interpreted all radiology studies.   Carolyne Littles, MD [phone] Pager

## 2014-09-05 NOTE — Progress Notes (Signed)
Initial Nutrition Assessment  DOCUMENTATION CODES:   Not applicable  INTERVENTION:   Ensure Enlive po BID, each supplement provides 350 kcal and 20 grams of protein  NUTRITION DIAGNOSIS:   Increased nutrient needs related to chronic illness as evidenced by estimated needs  GOAL:   Patient will meet greater than or equal to 90% of their needs   MONITOR:   PO intake, Supplement acceptance, Labs, Weight trends, I & O's, Goals of care  REASON FOR ASSESSMENT:   Malnutrition Screening Tool, Low Braden  ASSESSMENT:   81 y.o. Female with PMHx of depression, CVA, HTN, CHF, DM type II, COPD, presented with dyspnea.   CT of head showed probable acute stroke with multiple chronic stroke.  RD unable to obtain nutrition hx of complete Nutrition Focused Physical Exam.  Pt currently on BiPAP and receiving carotid doppler.  Nutrient needs increased given chronic illness.  Would benefit from addition of oral nutrition supplements.  RD to order.  Low braden score places patient at risk for skin breakdown.  Diet Order:  Diet Heart Room service appropriate?: Yes; Fluid consistency:: Thin  Skin:  Reviewed, no issues  Last BM:  7/25  Height:   Ht Readings from Last 1 Encounters:  09/05/14  (1.626 m)    Weight:   Wt Readings from Last 1 Encounters:  09/05/14 123 lb (55.792 kg)    Ideal Body Weight:  54.5 kg  Wt Readings from Last 10 Encounters:  09/05/14 123 lb (55.792 kg)  07/07/14 123 lb (55.792 kg)    BMI:  Body mass index is 21.1 kg/(m^2).  Estimated Nutritional Needs:   Kcal:  1200-1400  Protein:  55-65 gm  Fluid:  >/= 1.5 L  EDUCATION NEEDS:   No education needs identified at this time  Maureen Chatters, RD, LDN Pager #: [phone] After-Hours Pager #: [phone]

## 2014-09-06 DIAGNOSIS — J690 Pneumonitis due to inhalation of food and vomit: Secondary | ICD-10-CM

## 2014-09-06 LAB — CBC WITH DIFFERENTIAL/PLATELET
BASOS ABS: 0 10*3/uL (ref 0.0–0.1)
Basophils Relative: 0 % (ref 0–1)
Eosinophils Absolute: 0.5 10*3/uL (ref 0.0–0.7)
Eosinophils Relative: 4 % (ref 0–5)
HCT: 32 % — ABNORMAL LOW (ref 36.0–46.0)
HEMOGLOBIN: 10.5 g/dL — AB (ref 12.0–15.0)
LYMPHS ABS: 0.8 10*3/uL (ref 0.7–4.0)
Lymphocytes Relative: 7 % — ABNORMAL LOW (ref 12–46)
MCH: 32.9 pg (ref 26.0–34.0)
MCHC: 32.8 g/dL (ref 30.0–36.0)
MCV: 100.3 fL — ABNORMAL HIGH (ref 78.0–100.0)
Monocytes Absolute: 0.9 10*3/uL (ref 0.1–1.0)
Monocytes Relative: 7 % (ref 3–12)
NEUTROS ABS: 10.6 10*3/uL — AB (ref 1.7–7.7)
Neutrophils Relative %: 83 % — ABNORMAL HIGH (ref 43–77)
PLATELETS: 215 10*3/uL (ref 150–400)
RBC: 3.19 MIL/uL — AB (ref 3.87–5.11)
RDW: 15.7 % — ABNORMAL HIGH (ref 11.5–15.5)
WBC: 12.8 10*3/uL — ABNORMAL HIGH (ref 4.0–10.5)

## 2014-09-06 LAB — COMPREHENSIVE METABOLIC PANEL
ALBUMIN: 2.5 g/dL — AB (ref 3.5–5.0)
ALT: 13 U/L — AB (ref 14–54)
AST: 14 U/L — ABNORMAL LOW (ref 15–41)
Alkaline Phosphatase: 54 U/L (ref 38–126)
Anion gap: 7 (ref 5–15)
BILIRUBIN TOTAL: 0.8 mg/dL (ref 0.3–1.2)
BUN: 36 mg/dL — AB (ref 6–20)
CALCIUM: 8.9 mg/dL (ref 8.9–10.3)
CHLORIDE: 100 mmol/L — AB (ref 101–111)
CO2: 34 mmol/L — AB (ref 22–32)
CREATININE: 1.29 mg/dL — AB (ref 0.44–1.00)
GFR calc Af Amer: 43 mL/min — ABNORMAL LOW (ref >60)
GFR calc non Af Amer: 37 mL/min — ABNORMAL LOW (ref >60)
Glucose, Bld: 132 mg/dL — ABNORMAL HIGH (ref 65–99)
POTASSIUM: 4.2 mmol/L (ref 3.5–5.1)
Sodium: 141 mmol/L (ref 135–145)
Total Protein: 5.3 g/dL — ABNORMAL LOW (ref 6.5–8.1)

## 2014-09-06 LAB — GLUCOSE, CAPILLARY
GLUCOSE-CAPILLARY: 193 mg/dL — AB (ref 65–99)
Glucose-Capillary: 134 mg/dL — ABNORMAL HIGH (ref 65–99)
Glucose-Capillary: 137 mg/dL — ABNORMAL HIGH (ref 65–99)
Glucose-Capillary: 150 mg/dL — ABNORMAL HIGH (ref 65–99)
Glucose-Capillary: 182 mg/dL — ABNORMAL HIGH (ref 65–99)
Glucose-Capillary: 194 mg/dL — ABNORMAL HIGH (ref 65–99)
Glucose-Capillary: 233 mg/dL — ABNORMAL HIGH (ref 65–99)

## 2014-09-06 LAB — MAGNESIUM: Magnesium: 2.4 mg/dL (ref 1.7–2.4)

## 2014-09-06 MED ORDER — CETYLPYRIDINIUM CHLORIDE 0.05 % MT LIQD
7.0000 mL | Freq: Two times a day (BID) | OROMUCOSAL | Status: DC
  Administered 2014-09-06 – 2014-09-07 (×4): 7 mL via OROMUCOSAL

## 2014-09-06 MED ORDER — MORPHINE SULFATE 2 MG/ML IJ SOLN
2.0000 mg | INTRAMUSCULAR | Status: DC | PRN
  Administered 2014-09-06 – 2014-09-09 (×6): 2 mg via INTRAVENOUS
  Filled 2014-09-06 (×7): qty 1

## 2014-09-06 NOTE — Progress Notes (Signed)
RT note- patient pulled herself off the Bipap and was on no oxygen, sp02 75%. Placedto 6 l/min [place], currently 92%.

## 2014-09-06 NOTE — Progress Notes (Signed)
ANTIBIOTIC CONSULT NOTE - FOLLOW UP  Pharmacy Consult for Vanco/Zosyn Indication: PNA, sepsis  Allergies  Allergen Reactions  . Codeine     unknown  . Doxazosin Mesylate     unknown  . Gemfibrozil     unknown  . Prednisone     REACTION: sensitivity  . Rosiglitazone Maleate     REACTION: sensitivity    Patient Measurements: Height:  (162.6 cm) Weight: 128 lb 1.4 oz (58.1 kg) IBW/kg (Calculated) : 54.7 Adjusted Body Weight:    Vital Signs: Temp: 97.7 F (36.5 C) (07/27 1220) Temp Source: Oral (07/27 1220) BP: 151/46 mmHg (07/27 1220) Pulse Rate: 94 (07/27 1220) Intake/Output from previous day: 07/26 0701 - 07/27 0700 In: 876.7 [I.V.:372.7; Blood:304; IV Piggyback:200] Out: 1925 [Urine:1925] Intake/Output from this shift: Total I/O In: 40 [I.V.:40] Out: -   Labs:  Recent Labs  09/03/14 2140 09/04/14 0413 09/05/14 0612 09/06/14 0340  WBC  --  7.5 5.3 12.8*  HGB  --  9.3* 7.7* 10.5*  PLT  --  209 179 215  LABCREA 45.91  46.10  --   --   --   CREATININE  --  1.64* 1.24* 1.29*   Estimated Creatinine Clearance: 29 mL/min (by C-G formula based on Cr of 1.29). No results for input(s): VANCOTROUGH, VANCOPEAK, VANCORANDOM, GENTTROUGH, GENTPEAK, GENTRANDOM, TOBRATROUGH, TOBRAPEAK, TOBRARND, AMIKACINPEAK, AMIKACINTROU, AMIKACIN in the last 72 hours.   Assessment: ID: possible pna/sepsis. WBC 7.5>5.3>12.8? down. Scr down 1.24>1.29. UOP 1.4  Vanc 7/24> Zosyn 7/24>  7/24: UCx: negative 7/24 BC x 2>>  Goal of Therapy:  Vancomycin trough level 15-20 mcg/ml  Plan:  Vancomycin  IV q24h.Trough in AM Zosyn 3.375g IV q8hr   Glenard Keesling S. Merilynn Finland, PharmD, BCPS Clinical Staff Pharmacist Pager [phone]  Misty Stanley Stillinger 09/06/2014,2:05 PM

## 2014-09-06 NOTE — Progress Notes (Signed)
Pt pulled BIPAP mask off stating that she wanted it off.  Pt placed on [place] and tolerating well at this time, RT to monitor and assess as needed.

## 2014-09-07 ENCOUNTER — Inpatient Hospital Stay (HOSPITAL_COMMUNITY): Payer: Medicare Other

## 2014-09-07 DIAGNOSIS — J810 Acute pulmonary edema: Secondary | ICD-10-CM | POA: Diagnosis present

## 2014-09-07 DIAGNOSIS — J690 Pneumonitis due to inhalation of food and vomit: Secondary | ICD-10-CM | POA: Diagnosis present

## 2014-09-07 LAB — COMPREHENSIVE METABOLIC PANEL
ALT: 14 U/L (ref 14–54)
ANION GAP: 10 (ref 5–15)
AST: 15 U/L (ref 15–41)
Albumin: 2.4 g/dL — ABNORMAL LOW (ref 3.5–5.0)
Alkaline Phosphatase: 61 U/L (ref 38–126)
BILIRUBIN TOTAL: 1 mg/dL (ref 0.3–1.2)
BUN: 31 mg/dL — AB (ref 6–20)
CHLORIDE: 96 mmol/L — AB (ref 101–111)
CO2: 36 mmol/L — ABNORMAL HIGH (ref 22–32)
Calcium: 9 mg/dL (ref 8.9–10.3)
Creatinine, Ser: 1.24 mg/dL — ABNORMAL HIGH (ref 0.44–1.00)
GFR calc Af Amer: 46 mL/min — ABNORMAL LOW (ref >60)
GFR, EST NON AFRICAN AMERICAN: 39 mL/min — AB (ref >60)
Glucose, Bld: 177 mg/dL — ABNORMAL HIGH (ref 65–99)
POTASSIUM: 3.8 mmol/L (ref 3.5–5.1)
Sodium: 142 mmol/L (ref 135–145)
TOTAL PROTEIN: 5.3 g/dL — AB (ref 6.5–8.1)

## 2014-09-07 LAB — CBC WITH DIFFERENTIAL/PLATELET
BASOS PCT: 0 % (ref 0–1)
Basophils Absolute: 0 10*3/uL (ref 0.0–0.1)
Eosinophils Absolute: 0.4 10*3/uL (ref 0.0–0.7)
Eosinophils Relative: 4 % (ref 0–5)
HCT: 32.9 % — ABNORMAL LOW (ref 36.0–46.0)
Hemoglobin: 10.6 g/dL — ABNORMAL LOW (ref 12.0–15.0)
Lymphocytes Relative: 4 % — ABNORMAL LOW (ref 12–46)
Lymphs Abs: 0.4 10*3/uL — ABNORMAL LOW (ref 0.7–4.0)
MCH: 32.8 pg (ref 26.0–34.0)
MCHC: 32.2 g/dL (ref 30.0–36.0)
MCV: 101.9 fL — ABNORMAL HIGH (ref 78.0–100.0)
Monocytes Absolute: 1.1 10*3/uL — ABNORMAL HIGH (ref 0.1–1.0)
Monocytes Relative: 11 % (ref 3–12)
Neutro Abs: 8.4 10*3/uL — ABNORMAL HIGH (ref 1.7–7.7)
Neutrophils Relative %: 81 % — ABNORMAL HIGH (ref 43–77)
Platelets: 204 10*3/uL (ref 150–400)
RBC: 3.23 MIL/uL — ABNORMAL LOW (ref 3.87–5.11)
RDW: 15.2 % (ref 11.5–15.5)
WBC: 10.4 10*3/uL (ref 4.0–10.5)

## 2014-09-07 LAB — GLUCOSE, CAPILLARY
GLUCOSE-CAPILLARY: 257 mg/dL — AB (ref 65–99)
Glucose-Capillary: 163 mg/dL — ABNORMAL HIGH (ref 65–99)
Glucose-Capillary: 219 mg/dL — ABNORMAL HIGH (ref 65–99)
Glucose-Capillary: 225 mg/dL — ABNORMAL HIGH (ref 65–99)
Glucose-Capillary: 167 mg/dL — ABNORMAL HIGH (ref 65–99)
Glucose-Capillary: 294 mg/dL — ABNORMAL HIGH (ref 65–99)

## 2014-09-07 LAB — VANCOMYCIN, TROUGH: VANCOMYCIN TR: 13 ug/mL (ref 10.0–20.0)

## 2014-09-07 LAB — HEPARIN INDUCED PLATELET AB (HIT ANTIBODY): Heparin Induced Plt Ab: 0.151 OD (ref 0.000–0.400)

## 2014-09-07 LAB — MAGNESIUM: Magnesium: 2.1 mg/dL (ref 1.7–2.4)

## 2014-09-07 MED ORDER — INSULIN GLARGINE 100 UNIT/ML ~~LOC~~ SOLN
10.0000 [IU] | Freq: Every day | SUBCUTANEOUS | Status: DC
  Administered 2014-09-08 – 2014-09-09 (×2): 10 [IU] via SUBCUTANEOUS
  Filled 2014-09-07 (×2): qty 0.1

## 2014-09-07 MED ORDER — METOLAZONE 5 MG PO TABS
5.0000 mg | ORAL_TABLET | Freq: Once | ORAL | Status: AC
  Administered 2014-09-07: 5 mg via ORAL
  Filled 2014-09-07: qty 1

## 2014-09-07 MED ORDER — METHYLPREDNISOLONE SODIUM SUCC 125 MG IJ SOLR
60.0000 mg | INTRAMUSCULAR | Status: DC
  Administered 2014-09-07 – 2014-09-08 (×2): 60 mg via INTRAVENOUS
  Filled 2014-09-07: qty 2
  Filled 2014-09-07 (×2): qty 0.96

## 2014-09-07 MED ORDER — FUROSEMIDE 10 MG/ML IJ SOLN
60.0000 mg | Freq: Three times a day (TID) | INTRAMUSCULAR | Status: DC
  Administered 2014-09-07 – 2014-09-11 (×12): 60 mg via INTRAVENOUS
  Filled 2014-09-07 (×15): qty 6

## 2014-09-07 MED ORDER — VANCOMYCIN HCL IN DEXTROSE 750-5 MG/150ML-% IV SOLN
750.0000 mg | INTRAVENOUS | Status: AC
  Administered 2014-09-07 – 2014-09-09 (×3): 750 mg via INTRAVENOUS
  Filled 2014-09-07 (×3): qty 150

## 2014-09-07 NOTE — Progress Notes (Signed)
[place] TEAM 1 - Stepdown/ICU TEAM Progress Note  [place] Earnhart ZOX:[id number] DOB: Nov 19, 1930 DOA: 09/03/2014 PCP: Julian Hy, MD  Admit HPI / Brief Narrative: 81 y.o. WF PMHx Depression, CVA, HTN, CHF DM type II, COPD, presenting with cc of dyspnea. Patient is currently on Bipap and can't provide history. No family at bedside ; she is a resident at [facility]. History taking from ER physician. She reported orthopnea. She had a pneumonia three weeks ago and she reported that her symptoms now are similar to her symptoms from 3 weeks ago. No chest pain.   HPI/Subjective: 7/28  A/O  patient on 6 L O2 via [place] appears comfortable.    Assessment/Plan: Acute diastolic HF:  -7/24 PCXR; bibasilar opacification see results below. -Echocardiogram; consistent with diastolic CHF -Strict I&O; -2.1 L  -Daily weight  -Transfuse for hemoglobin<8  Pulmonary edema? -Continue D5W-0.45% saline to [facility] -7/25 PCXR; consistent with increasing pulmonary edema -7/28 CVP=9 -Increase Lasix 60 mg TIID -Zaroxolyn 5 mg 1  Sepsis due to HCAP?/Aspiration pneumonia?  -Continue Vancomycin and Zosyn; complete 7 day course  -Sputum pending -DuoNeb QID -Mucinex DM BID -Solu-Medrol 60 mg daily -Flutter valve  Acute respiratory failure with Hypercapnia   -Likely multifactorial due to Pneumonia vs. Acute HF vs. COPD exacerbation; troponin 2 negative  -Continue  BiPAP and oxygen therapy PRN  -Continue albuterol Q4H  Anemia -Most likely multifactorial to include sepsis, dilutional, underproduction.  -Obtain anemia panel, haptoglobin, LDH -7/26 Type and cross and transfuse 1 unit PRBC  Decreasing platelets (sepsis vs hit?) -Covered -DC all heparin containing products -Hit panel pending -  Hypokalemia -Potassium goal> 4 -Potassium IV 10 mg 3  Hypomagnesemia -Magnesium goal> 2 -Magnesium IV 2 gm  DM Type II uncontrolled -7/24 Hemoglobin A1c= 7.5  -Increase to resistant SSI -Increase  Lantus 10 mg daily  HLD -Lipid panel; slightly outside ADA guidelines -Increase Lipitor to 20 mg daily  Acute on chronic kidney failure (Cr Baseline ~ 1.3).  -Due to HF vs. Sepsis -Cr baseline  Anxiety -continue Ativan 0.25 mg daily PRN   -Continue  Cymbalta 60 mg daily  Altered mental status -Most likely multifactorial to include sepsis, acute/subacute stroke; appears to be clearing  Acute stroke on chronic strokes -7/24 CT head shows probable acute stroke with multiple chronic stroke. See results below -Allow for permissive HTN -Carotid Dopplers pending -MRI brain pending, MRI head on hold secondary to patient being unable to lay flat for study   Tobacco abuse - Patient is a 1 PPD 50 years smoker (stopped July 2010).      Code Status: FULL Family Communication: Spoke with sister-in-law .  Disposition Plan: Resolution sepsis/completion of stroke workup    Consultants: Dr. Pasty Arch (PCCM)    Procedure/Significant Events: 7/24 PCXR; poor respiratory effort resulted in hyperexpansion, bibasilar airspace opacification to my: Pneumonia? 7/24 CT head without contrast; Atrophy with multiple areas of prior infarct.  -Suspect recent infarct Rt cerebellum slightly inferior to the right dentate nucleus.  -Age uncertain infarct Rt thalamus. -Chronic infarct Lt thalamus as well as in the tail of the caudate nucleus 7/25 renal ultrasound;-Rt kidney atrophic w/ thinning and increased echogenicity renal parenchyma, suggestive of chronic renal Dz 7/25 echocardiogram;- Left ventricle: severe LVH. -LVEF= 60%- 65%.  -Dynamic obstruction. Dynamic Obstruction, with a peak gradient of 78 mm Hg.-Relaxation (grade 1 diastolic dysfunction).    Culture 7/24 MRSA by PCR negative 7/24 blood left/right arm NGTD 7/24 urine pending 7/24 strep pneumo/Legionella urine antigen negative  Antibiotics: Zosyn 7/24>> Vancomycin 7/24>>   DVT prophylaxis: Lovenox   Devices    LINES /  TUBES:      Continuous Infusions: . dextrose 5 % and 0.45% NaCl 10 mL/hr at 09/05/14 1034    Objective: VITAL SIGNS: Temp: 98 F (36.7 C) (07/28 1143) Temp Source: Oral (07/28 1143) BP: 146/53 mmHg (07/28 1200) Pulse Rate: 86 (07/28 1200) SPO2; FIO2:   Intake/Output Summary (Last 24 hours) at 09/07/14 1342 Last data filed at 09/07/14 1300  Gross per 24 hour  Intake    620 ml  Output   2050 ml  Net  -1430 ml     Exam: General: A/O 4, on 6 L O2 via [place], Eyes: Pupils sluggishly reactive to light    ENT: negative gingival bleeding Neck:  Negative scars, masses, torticollis, lymphadenopathy, JVD Lungs: poor inspiratory effort, decreased breath sounds diffusely, negative expiratory wheezing, bibasilar crackles.  Cardiovascular: Regular rate and rhythm without murmur gallop or rub normal S1 and S2 Abdomen: Soft, nontender, plus bowel sounds  Extremities: No significant cyanosis, clubbing, or edema bilateral lower extremities Psychiatric:  Unable to assess  Neurologic:  Follows some commands right sided hemiparesis,   Data Reviewed: Basic Metabolic Panel:  Recent Labs Lab 09/03/14 2122 09/04/14 0413 09/05/14 0612 09/06/14 0340 09/07/14 0430  NA 140 145 140 141 142  K 3.4* 3.4* 3.2* 4.2 3.8  CL 98* 103 104 100* 96*  CO2 34* 32 30 34* 36*  GLUCOSE 151* 264* 232* 132* 177*  BUN 38* 38* 27* 36* 31*  CREATININE 1.52* 1.64* 1.24* 1.29* 1.24*  CALCIUM 8.9 8.8* 7.9* 8.9 9.0  MG 2.3 2.1 1.7 2.4 2.1   Liver Function Tests:  Recent Labs Lab 09/03/14 0309 09/04/14 0413 09/05/14 0612 09/06/14 0340 09/07/14 0430  AST 14* 10* 11* 14* 15  ALT 12* 11* 9* 13* 14  ALKPHOS 73 57 42 54 61  BILITOT 0.5 0.8 1.3* 0.8 1.0  PROT 6.0* 4.9* 4.0* 5.3* 5.3*  ALBUMIN 3.0* 2.3* 1.9* 2.5* 2.4*   No results for input(s): LIPASE, AMYLASE in the last 168 hours. No results for input(s): AMMONIA in the last 168 hours. CBC:  Recent Labs Lab 09/03/14 0309 09/04/14 0413  09/05/14 0612 09/06/14 0340 09/07/14 0430  WBC 14.2* 7.5 5.3 12.8* 10.4  NEUTROABS 12.5* 6.0 3.7 10.6* 8.4*  HGB 11.2* 9.3* 7.7* 10.5* 10.6*  HCT 35.3* 29.5* 23.8* 32.0* 32.9*  MCV 105.1* 106.1* 103.9* 100.3* 101.9*  PLT 265 209 179 215 204   Cardiac Enzymes:  Recent Labs Lab 09/03/14 0309 09/03/14 1055  TROPONINI 0.03 0.03   BNP (last 3 results)  Recent Labs  09/03/14 0309  BNP 377.8*    ProBNP (last 3 results) No results for input(s): PROBNP in the last 8760 hours.  CBG:  Recent Labs Lab 09/06/14 2002 09/06/14 2346 09/07/14 0457 09/07/14 0741 09/07/14 1146  GLUCAP 194* 193* 163* 219* 225*    Recent Results (from the past 240 hour(s))  MRSA PCR Screening     Status: None   Collection Time: 09/03/14  8:41 AM  Result Value Ref Range Status   MRSA by PCR NEGATIVE NEGATIVE Final    Comment:        The GeneXpert MRSA Assay (FDA approved for NASAL specimens only), is one component of a comprehensive MRSA colonization surveillance program. It is not intended to diagnose MRSA infection nor to guide or monitor treatment for MRSA infections.   Culture, blood (routine x 2)  Status: None (Preliminary result)   Collection Time: 09/03/14  9:56 AM  Result Value Ref Range Status   Specimen Description BLOOD LEFT ARM  Final   Special Requests IN PEDIATRIC BOTTLE 1CC  Final   Culture NO GROWTH 4 DAYS  Final   Report Status PENDING  Incomplete  Culture, blood (routine x 2)     Status: None (Preliminary result)   Collection Time: 09/03/14 11:10 AM  Result Value Ref Range Status   Specimen Description BLOOD RIGHT ARM  Final   Special Requests BOTTLES DRAWN AEROBIC ONLY 7CC  Final   Culture NO GROWTH 4 DAYS  Final   Report Status PENDING  Incomplete  Culture, Urine     Status: None   Collection Time: 09/03/14 11:44 AM  Result Value Ref Range Status   Specimen Description URINE, RANDOM  Final   Special Requests NONE  Final   Culture NO GROWTH 1 DAY  Final    Report Status 09/05/2014 FINAL  Final     Studies:  Recent x-ray studies have been reviewed in detail by the Attending Physician  Scheduled Meds:  Scheduled Meds: . sodium chloride   Intravenous Once  . amLODipine  10 mg Oral Daily  . antiseptic oral rinse  7 mL Mouth Rinse BID  . atorvastatin  20 mg Oral Daily  . dextromethorphan-guaiFENesin  1 tablet Oral BID  . docusate sodium  100 mg Oral BID  . DULoxetine  60 mg Oral Daily  . feeding supplement (ENSURE ENLIVE)  237 mL Oral BID BM  . furosemide  60 mg Intravenous BID  . hydrALAZINE  100 mg Oral 3 times per day  . insulin aspart  0-20 Units Subcutaneous 6 times per day  . insulin glargine  5 Units Subcutaneous Daily  . ipratropium-albuterol  3 mL Nebulization Q6H  . piperacillin-tazobactam (ZOSYN)  IV  3.375 g Intravenous 3 times per day  . sodium chloride  10-40 mL Intracatheter Q12H  . vancomycin  750 mg Intravenous Q24H    Time spent on care of this patient: 40 mins   Orva Riles, Roselind Messier , MD  [facility] Office  [phone] Pager - [phone]  On-Call/Text Page:      [email or web address]      password [email or web address]  If 7PM-7AM, please contact night-coverage [email or web address] Password [email or web address] 09/07/2014, 1:42 PM   LOS: 4 days   Care during the described time interval was provided by me .  I have reviewed this patient's available data, including medical history, events of note, physical examination, and all test results as part of my evaluation. I have personally reviewed and interpreted all radiology studies.   Carolyne Littles, MD [phone] Pager

## 2014-09-07 NOTE — Progress Notes (Signed)
ANTIBIOTIC CONSULT NOTE - FOLLOW UP  Pharmacy Consult for Vanco/Zosyn Indication: PNA, sepsis  Allergies  Allergen Reactions  . Codeine     unknown  . Doxazosin Mesylate     unknown  . Gemfibrozil     unknown  . Prednisone     REACTION: sensitivity  . Rosiglitazone Maleate     REACTION: sensitivity    Patient Measurements: Height:  (162.6 cm) Weight: 124 lb 5.4 oz (56.4 kg) IBW/kg (Calculated) : 54.7  Vital Signs: Temp: 97.4 F (36.3 C) (07/28 0400) Temp Source: Oral (07/28 0400) BP: 130/44 mmHg (07/28 0400) Pulse Rate: 90 (07/28 0400) Intake/Output from previous day: 07/27 0701 - 07/28 0700 In: 330 [P.O.:120; I.V.:110; IV Piggyback:100] Out: 2050 [Urine:2050] Intake/Output from this shift: Total I/O In: 160 [I.V.:60; IV Piggyback:100] Out: 900 [Urine:900]  Labs:  Recent Labs  09/05/14 0612 09/06/14 0340 09/07/14 0430  WBC 5.3 12.8* 10.4  HGB 7.7* 10.5* 10.6*  PLT 179 215 204  CREATININE 1.24* 1.29* 1.24*   Estimated Creatinine Clearance: 30.2 mL/min (by C-G formula based on Cr of 1.24).  Recent Labs  09/07/14 0420  VANCOTROUGH 13     Assessment: 81 y.o. F remains on Vancomycin and Zosyn (Day #5) for possible pna. WBC wnl. Afeb. SCr remains stable. UOP 1.5 ml/kg/hr.  Vanc 7/24> Zosyn 7/24>  7/24: UCx: negative 7/24 BC x 2>>ngtd  Vancomycin trough 13 mcg/ml (slightly subtherapeutic) on  IV q24h  Goal of Therapy:  Vancomycin trough level 15-20 mcg/ml  Plan:  Change Vancomycin to  IV q24h. Continue Zosyn 3.375g IV q8hr Will f/u renal function, micro data, pt's clinical condition F/u LOT with abx Consider Vanc trough at new Css if continues  Christoper Fabian, PharmD, BCPS Clinical pharmacist, pager [phone] 09/07/2014,5:47 AM

## 2014-09-07 NOTE — Progress Notes (Signed)
Pt able to lie flat without any complications. MRI stated it would be around 7 before pt could go down. Will continue to monitor

## 2014-09-07 NOTE — Care Management Important Message (Signed)
Important Message  Patient Details  Name: ANUREET BRUINGTON MRN: [id number] Date of Birth: Oct 17, 1930   Medicare Important Message Given:  Yes-third notification given    Kyla Balzarine 09/07/2014, 12:45 PMImportant Message  Patient Details  Name: BONNY VANLEEUWEN MRN: [id number] Date of Birth: Sep 23, 1930   Medicare Important Message Given:  Yes-third notification given    Kyla Balzarine 09/07/2014, 12:44 PM

## 2014-09-07 NOTE — Progress Notes (Signed)
CSW faxed updated clinicals to [facility] where pt is a long term resident- facility will continue to follow and admit pt back at time of DC if there is a bed available.  CSW will continue to follow.  Merlyn Lot, LCSWA Clinical Social Worker [phone]

## 2014-09-07 NOTE — Progress Notes (Signed)
Pt going down for MRI, has been on [place] since 9am. Daleyssa Loiselle V, RN

## 2014-09-07 NOTE — Progress Notes (Signed)
Patient is off BiPAP at this time. RT will place patient back on whenever ready.

## 2014-09-08 ENCOUNTER — Inpatient Hospital Stay (HOSPITAL_COMMUNITY): Payer: Medicare Other

## 2014-09-08 DIAGNOSIS — I5031 Acute diastolic (congestive) heart failure: Secondary | ICD-10-CM | POA: Diagnosis present

## 2014-09-08 DIAGNOSIS — I6521 Occlusion and stenosis of right carotid artery: Secondary | ICD-10-CM

## 2014-09-08 DIAGNOSIS — Z720 Tobacco use: Secondary | ICD-10-CM

## 2014-09-08 DIAGNOSIS — I6529 Occlusion and stenosis of unspecified carotid artery: Secondary | ICD-10-CM | POA: Diagnosis present

## 2014-09-08 DIAGNOSIS — I6350 Cerebral infarction due to unspecified occlusion or stenosis of unspecified cerebral artery: Secondary | ICD-10-CM

## 2014-09-08 DIAGNOSIS — I639 Cerebral infarction, unspecified: Secondary | ICD-10-CM | POA: Diagnosis present

## 2014-09-08 LAB — CBC WITH DIFFERENTIAL/PLATELET
Basophils Absolute: 0 10*3/uL (ref 0.0–0.1)
Basophils Relative: 0 % (ref 0–1)
Eosinophils Absolute: 0 10*3/uL (ref 0.0–0.7)
Eosinophils Relative: 0 % (ref 0–5)
HCT: 24.4 % — ABNORMAL LOW (ref 36.0–46.0)
HEMOGLOBIN: 7.9 g/dL — AB (ref 12.0–15.0)
LYMPHS ABS: 0.4 10*3/uL — AB (ref 0.7–4.0)
Lymphocytes Relative: 4 % — ABNORMAL LOW (ref 12–46)
MCH: 32 pg (ref 26.0–34.0)
MCHC: 32.4 g/dL (ref 30.0–36.0)
MCV: 98.8 fL (ref 78.0–100.0)
Monocytes Absolute: 0.7 10*3/uL (ref 0.1–1.0)
Monocytes Relative: 7 % (ref 3–12)
NEUTROS PCT: 89 % — AB (ref 43–77)
Neutro Abs: 9.1 10*3/uL — ABNORMAL HIGH (ref 1.7–7.7)
PLATELETS: 272 10*3/uL (ref 150–400)
RBC: 2.47 MIL/uL — AB (ref 3.87–5.11)
RDW: 14.3 % (ref 11.5–15.5)
WBC: 10.3 10*3/uL (ref 4.0–10.5)

## 2014-09-08 LAB — MAGNESIUM: Magnesium: 1.9 mg/dL (ref 1.7–2.4)

## 2014-09-08 LAB — CULTURE, BLOOD (ROUTINE X 2)
CULTURE: NO GROWTH
Culture: NO GROWTH

## 2014-09-08 LAB — COMPREHENSIVE METABOLIC PANEL
ALBUMIN: 2.5 g/dL — AB (ref 3.5–5.0)
ALK PHOS: 59 U/L (ref 38–126)
ALT: 15 U/L (ref 14–54)
AST: 15 U/L (ref 15–41)
Anion gap: 7 (ref 5–15)
BUN: 30 mg/dL — ABNORMAL HIGH (ref 6–20)
CO2: 39 mmol/L — AB (ref 22–32)
Calcium: 9.3 mg/dL (ref 8.9–10.3)
Chloride: 92 mmol/L — ABNORMAL LOW (ref 101–111)
Creatinine, Ser: 1.33 mg/dL — ABNORMAL HIGH (ref 0.44–1.00)
GFR calc Af Amer: 42 mL/min — ABNORMAL LOW (ref >60)
GFR calc non Af Amer: 36 mL/min — ABNORMAL LOW (ref >60)
GLUCOSE: 105 mg/dL — AB (ref 65–99)
Potassium: 3.3 mmol/L — ABNORMAL LOW (ref 3.5–5.1)
Sodium: 138 mmol/L (ref 135–145)
Total Bilirubin: 0.8 mg/dL (ref 0.3–1.2)
Total Protein: 5.5 g/dL — ABNORMAL LOW (ref 6.5–8.1)

## 2014-09-08 LAB — GLUCOSE, CAPILLARY
GLUCOSE-CAPILLARY: 100 mg/dL — AB (ref 65–99)
GLUCOSE-CAPILLARY: 237 mg/dL — AB (ref 65–99)
Glucose-Capillary: 195 mg/dL — ABNORMAL HIGH (ref 65–99)
Glucose-Capillary: 251 mg/dL — ABNORMAL HIGH (ref 65–99)
Glucose-Capillary: 140 mg/dL — ABNORMAL HIGH (ref 65–99)

## 2014-09-08 LAB — HEMOGLOBIN AND HEMATOCRIT, BLOOD
HCT: 34.9 % — ABNORMAL LOW (ref 36.0–46.0)
Hemoglobin: 11.5 g/dL — ABNORMAL LOW (ref 12.0–15.0)

## 2014-09-08 LAB — PREPARE RBC (CROSSMATCH)

## 2014-09-08 MED ORDER — FUROSEMIDE 10 MG/ML IJ SOLN
40.0000 mg | Freq: Once | INTRAMUSCULAR | Status: AC
  Administered 2014-09-08: 40 mg via INTRAVENOUS

## 2014-09-08 MED ORDER — IPRATROPIUM-ALBUTEROL 0.5-2.5 (3) MG/3ML IN SOLN
3.0000 mL | Freq: Three times a day (TID) | RESPIRATORY_TRACT | Status: DC
  Administered 2014-09-08 – 2014-09-12 (×14): 3 mL via RESPIRATORY_TRACT
  Filled 2014-09-08 (×13): qty 3

## 2014-09-08 MED ORDER — LORAZEPAM 2 MG/ML IJ SOLN: 0.5000 mg | Freq: Once | INTRAMUSCULAR | Status: DC

## 2014-09-08 MED ORDER — MAGNESIUM SULFATE IN D5W 10-5 MG/ML-% IV SOLN
1.0000 g | Freq: Once | INTRAVENOUS | Status: AC
  Administered 2014-09-08: 1 g via INTRAVENOUS
  Filled 2014-09-08: qty 100

## 2014-09-08 MED ORDER — POTASSIUM CHLORIDE 10 MEQ/100ML IV SOLN
10.0000 meq | Freq: Once | INTRAVENOUS | Status: AC
  Administered 2014-09-08: 10 meq via INTRAVENOUS
  Filled 2014-09-08: qty 100

## 2014-09-08 MED ORDER — SODIUM CHLORIDE 0.9 % IV SOLN: Freq: Once | INTRAVENOUS | Status: DC

## 2014-09-08 MED ORDER — POTASSIUM CHLORIDE 10 MEQ/100ML IV SOLN
10.0000 meq | INTRAVENOUS | Status: AC
  Administered 2014-09-08 (×2): 10 meq via INTRAVENOUS
  Filled 2014-09-08 (×2): qty 100

## 2014-09-08 NOTE — Progress Notes (Signed)
[place] TEAM 1 - Stepdown/ICU TEAM Progress Note  Haw[place]i Isenhower WUJ:[id number] DOB: 06/15/1930 DOA: 09/03/2014 PCP: Julian Hy, MD  Admit HPI / Brief Narrative: 81 y.o. WF PMHx Depression, CVA, HTN, CHF DM type II, COPD, presenting with cc of dyspnea. Patient is currently on Bipap and can't provide history. No family at bedside ; she is a resident at [facility]. History taking from ER physician. She reported orthopnea. She had a pneumonia three weeks ago and she reported that her symptoms now are similar to her symptoms from 3 weeks ago. No chest pain.   HPI/Subjective: 7/29  A/Ox4 patient back on her home settings of 4 L O2 via [place], much more interactive today even able to joke.    Assessment/Plan: Acute diastolic CHF:  -7/24 PCXR; bibasilar opacification see results below. -Echocardiogram; consistent with diastolic CHF -Strict I&O; -2.7 L  -Daily weight  -Transfuse for hemoglobin<8  Pulmonary edema? -Continue D5W-0.45% saline to KVO -7/29 CVP=5 -Continue Lasix 60 mg TIID -PCXR in a.m.  Sepsis due to HCAP?/Aspiration pneumonia?  -Continue Vancomycin and Zosyn; complete 7 day course  -Sputum pending -DuoNeb QID -Mucinex DM BID -Solu-Medrol 60 mg daily -Flutter valve  Acute respiratory failure with Hypercapnia   -Likely multifactorial due to Pneumonia vs. Acute HF vs. COPD exacerbation; troponin 2 negative  -Continue  BiPAP and oxygen therapy PRN  -Continue albuterol Q4H  Anemia -Most likely multifactorial to include sepsis, dilutional, underproduction.  -Obtain anemia panel, haptoglobin, LDH -7/26 Type and cross and transfuse 1 unit PRBC -7/29 transfuse 1 unit PRBC  Decreasing platelets (sepsis vs hit?) -Covered -DC all heparin containing products -Hit panel pending  Hypokalemia -Potassium goal> 4 -Potassium IV 10 meq 4  Hypomagnesemia -Magnesium goal> 2 -Magnesium IV 1 gm  DM Type II uncontrolled -7/24 Hemoglobin A1c= 7.5  -Increase to  resistant SSI -Increase Lantus 10 mg daily  HLD -Lipid panel; slightly outside ADA guidelines -Increase Lipitor to 20 mg daily  Acute on chronic kidney failure (Cr Baseline ~ 1.3).  -Due to HF vs. Sepsis -Cr baseline  Anxiety -continue Ativan 0.25 mg daily PRN   -Continue  Cymbalta 60 mg daily  Altered mental status -Most likely multifactorial to include sepsis, acute/subacute stroke; appears to be clearing  Acute stroke on chronic strokes -7/24 CT head shows probable acute stroke with multiple chronic stroke. See results below -Allow for permissive HTN -Carotid Dopplers pending -MRI brain pending, MRI head on hold secondary to patient being unable to lay flat for study   Stenosis right external carotid artery -Most likely not a surgical candidate but will contact vascular surgery name  Tobacco abuse - Patient is a 1 PPD 50 years smoker (stopped July 2010).      Code Status: FULL Family Communication: Spoke with sister-in-law .  Disposition Plan: Resolution sepsis/completion of stroke workup    Consultants: Dr. Pasty Arch (PCCM)    Procedure/Significant Events: 7/24 PCXR; poor respiratory effort resulted in hyperexpansion, bibasilar airspace opacification to my: Pneumonia? 7/24 CT head without contrast; Atrophy with multiple areas of prior infarct.  -Suspect recent infarct Rt cerebellum slightly inferior to the right dentate nucleus.  -Age uncertain infarct Rt thalamus. -Chronic infarct Lt thalamus as well as in the tail of the caudate nucleus 7/25 renal ultrasound;-Rt kidney atrophic w/ thinning and increased echogenicity renal parenchyma, suggestive of chronic renal Dz 7/25 echocardiogram;- Left ventricle: severe LVH. -LVEF= 60%- 65%.  -Dynamic obstruction. Dynamic Obstruction, with a peak gradient of 78 mm Hg.-Relaxation (grade 1 diastolic dysfunction).  7/26 carotid Dopplers; Rt - 1-39% ICA stenosis.Rt ECA stenosis. Left - 40% to 59% ICA stenosis. Vertebral  artery flow is antegrade.    Culture 7/24 MRSA by PCR negative 7/24 blood left/right arm NGTD 7/24 urine negative 7/24 strep pneumo/Legionella urine antigen negative   Antibiotics: Zosyn 7/24>> stopped 7/30 Vancomycin 7/24>> stopped 7/30   DVT prophylaxis: Lovenox   Devices    LINES / TUBES:      Continuous Infusions: . dextrose 5 % and 0.45% NaCl 10 mL/hr at 09/05/14 1034    Objective: VITAL SIGNS: Temp: 98 F (36.7 C) (07/29 2004) Temp Source: Oral (07/29 2004) BP: 161/56 mmHg (07/29 2004) Pulse Rate: 98 (07/29 2004) SPO2; 96% on 5 L O2 via [place] FIO2:   Intake/Output Summary (Last 24 hours) at 09/08/14 2044 Last data filed at 09/08/14 4098  Gross per 24 hour  Intake 426.17 ml  Output    950 ml  Net -523.83 ml     Exam: General: A/O 4, on 6 L O2 via , Eyes: Pupils sluggishly reactive to light    ENT: negative gingival bleeding Neck:  Negative scars, masses, torticollis, lymphadenopathy, JVD Lungs: poor inspiratory effort, decreased breath sounds diffusely, negative expiratory wheezing, bibasilar crackles.  Cardiovascular: Regular rate and rhythm without murmur gallop or rub normal S1 and S2 Abdomen: Soft, nontender, plus bowel sounds  Extremities: No significant cyanosis, clubbing, or edema bilateral lower extremities Psychiatric:  Unable to assess  Neurologic:  Follows some commands right sided hemiparesis,   Data Reviewed: Basic Metabolic Panel:  Recent Labs Lab 09/04/14 0413 09/05/14 0612 09/06/14 0340 09/07/14 0430 09/08/14 0517  NA 145 140 141 142 138  K 3.4* 3.2* 4.2 3.8 3.3*  CL 103 104 100* 96* 92*  CO2 32 30 34* 36* 39*  GLUCOSE 264* 232* 132* 177* 105*  BUN 38* 27* 36* 31* 30*  CREATININE 1.64* 1.24* 1.29* 1.24* 1.33*  CALCIUM 8.8* 7.9* 8.9 9.0 9.3  MG 2.1 1.7 2.4 2.1 1.9   Liver Function Tests:  Recent Labs Lab 09/04/14 0413 09/05/14 0612 09/06/14 0340 09/07/14 0430 09/08/14 0517  AST 10* 11* 14* 15 15  ALT  11* 9* 13* 14 15  ALKPHOS 57 42 54 61 59  BILITOT 0.8 1.3* 0.8 1.0 0.8  PROT 4.9* 4.0* 5.3* 5.3* 5.5*  ALBUMIN 2.3* 1.9* 2.5* 2.4* 2.5*   No results for input(s): LIPASE, AMYLASE in the last 168 hours. No results for input(s): AMMONIA in the last 168 hours. CBC:  Recent Labs Lab 09/04/14 0413 09/05/14 0612 09/06/14 0340 09/07/14 0430 09/08/14 0517  WBC 7.5 5.3 12.8* 10.4 10.3  NEUTROABS 6.0 3.7 10.6* 8.4* 9.1*  HGB 9.3* 7.7* 10.5* 10.6* 7.9*  HCT 29.5* 23.8* 32.0* 32.9* 24.4*  MCV 106.1* 103.9* 100.3* 101.9* 98.8  PLT 209 179 215 204 272   Cardiac Enzymes:  Recent Labs Lab 09/03/14 0309 09/03/14 1055  TROPONINI 0.03 0.03   BNP (last 3 results)  Recent Labs  09/03/14 0309  BNP 377.8*    ProBNP (last 3 results) No results for input(s): PROBNP in the last 8760 hours.  CBG:  Recent Labs Lab 09/08/14 0423 09/08/14 0820 09/08/14 1314 09/08/14 1742 09/08/14 2006  GLUCAP 100* 195* 251* 140* 237*    Recent Results (from the past 240 hour(s))  MRSA PCR Screening     Status: None   Collection Time: 09/03/14  8:41 AM  Result Value Ref Range Status   MRSA by PCR NEGATIVE NEGATIVE Final    Comment:  The GeneXpert MRSA Assay (FDA approved for NASAL specimens only), is one component of a comprehensive MRSA colonization surveillance program. It is not intended to diagnose MRSA infection nor to guide or monitor treatment for MRSA infections.   Culture, blood (routine x 2)     Status: None   Collection Time: 09/03/14  9:56 AM  Result Value Ref Range Status   Specimen Description BLOOD LEFT ARM  Final   Special Requests IN PEDIATRIC BOTTLE 1CC  Final   Culture NO GROWTH 5 DAYS  Final   Report Status 09/08/2014 FINAL  Final  Culture, blood (routine x 2)     Status: None   Collection Time: 09/03/14 11:10 AM  Result Value Ref Range Status   Specimen Description BLOOD RIGHT ARM  Final   Special Requests BOTTLES DRAWN AEROBIC ONLY 7CC  Final   Culture  NO GROWTH 5 DAYS  Final   Report Status 09/08/2014 FINAL  Final  Culture, Urine     Status: None   Collection Time: 09/03/14 11:44 AM  Result Value Ref Range Status   Specimen Description URINE, RANDOM  Final   Special Requests NONE  Final   Culture NO GROWTH 1 DAY  Final   Report Status 09/05/2014 FINAL  Final     Studies:  Recent x-ray studies have been reviewed in detail by the Attending Physician  Scheduled Meds:  Scheduled Meds: . sodium chloride   Intravenous Once  . sodium chloride   Intravenous Once  . amLODipine  10 mg Oral Daily  . atorvastatin  20 mg Oral Daily  . dextromethorphan-guaiFENesin  1 tablet Oral BID  . docusate sodium  100 mg Oral BID  . DULoxetine  60 mg Oral Daily  . feeding supplement (ENSURE ENLIVE)  237 mL Oral BID BM  . furosemide  40 mg Intravenous Once  . furosemide  60 mg Intravenous TID  . hydrALAZINE  100 mg Oral 3 times per day  . insulin aspart  0-20 Units Subcutaneous 6 times per day  . insulin glargine  10 Units Subcutaneous Daily  . ipratropium-albuterol  3 mL Nebulization TID  . LORazepam  0.5-2 mg Intravenous Once  . methylPREDNISolone (SOLU-MEDROL) injection  60 mg Intravenous Q24H  . piperacillin-tazobactam (ZOSYN)  IV  3.375 g Intravenous 3 times per day  . sodium chloride  10-40 mL Intracatheter Q12H  . vancomycin  750 mg Intravenous Q24H    Time spent on care of this patient: 40 mins   Tylesha Gibeault, Roselind Messier , MD  [facility] Office  [phone] Pager - [phone]  On-Call/Text Page:      [email or web address]      password [email or web address]  If 7PM-7AM, please contact night-coverage [email or web address] Password [employer] 09/08/2014, 8:44 PM   LOS: 5 days   Care during the described time interval was provided by me .  I have reviewed this patient's available data, including medical history, events of note, physical examination, and all test results as part of my evaluation. I have personally reviewed and interpreted all radiology  studies.   Carolyne Littles, MD [phone] Pager

## 2014-09-08 NOTE — Care Management Note (Signed)
Case Management Note  Patient Details  Name: LABRENDA LASKY MRN: [id number] Date of Birth: 28-Oct-1930  Subjective/Objective:  Pt is a resident of [facility].                 Action/Plan: CSW following for return to SNF               Expected Discharge Plan:  Skilled Nursing Facility  In-House Referral:  Clinical Social Work   Status of Service:  Completed, signed off  Medicare Important Message Given:  Yes-third notification given  Magdalene River, RN 09/08/2014, 2:07 PM

## 2014-09-08 NOTE — Progress Notes (Signed)
K+ 3.3 notified Kirby with [facility].

## 2014-09-08 NOTE — Progress Notes (Signed)
Oxygen sats down to 85% went into room nasal [place]ula oxygen oxygen was not in nose, put Bipap back on and sats came up into 90s.

## 2014-09-09 DIAGNOSIS — D649 Anemia, unspecified: Secondary | ICD-10-CM

## 2014-09-09 LAB — BASIC METABOLIC PANEL
Anion gap: 10 (ref 5–15)
BUN: 36 mg/dL — AB (ref 6–20)
CO2: 38 mmol/L — AB (ref 22–32)
CREATININE: 1.54 mg/dL — AB (ref 0.44–1.00)
Calcium: 9.1 mg/dL (ref 8.9–10.3)
Chloride: 86 mmol/L — ABNORMAL LOW (ref 101–111)
GFR calc Af Amer: 35 mL/min — ABNORMAL LOW (ref >60)
GFR calc non Af Amer: 30 mL/min — ABNORMAL LOW (ref >60)
Glucose, Bld: 324 mg/dL — ABNORMAL HIGH (ref 65–99)
Potassium: 2.7 mmol/L — CL (ref 3.5–5.1)
Sodium: 134 mmol/L — ABNORMAL LOW (ref 135–145)

## 2014-09-09 LAB — CBC WITH DIFFERENTIAL/PLATELET
Basophils Absolute: 0 10*3/uL (ref 0.0–0.1)
Basophils Relative: 0 % (ref 0–1)
Eosinophils Absolute: 0 10*3/uL (ref 0.0–0.7)
Eosinophils Relative: 0 % (ref 0–5)
HEMATOCRIT: 36.1 % (ref 36.0–46.0)
Hemoglobin: 11.8 g/dL — ABNORMAL LOW (ref 12.0–15.0)
LYMPHS PCT: 5 % — AB (ref 12–46)
Lymphs Abs: 0.4 10*3/uL — ABNORMAL LOW (ref 0.7–4.0)
MCH: 31.1 pg (ref 26.0–34.0)
MCHC: 32.7 g/dL (ref 30.0–36.0)
MCV: 95.3 fL (ref 78.0–100.0)
MONOS PCT: 13 % — AB (ref 3–12)
Monocytes Absolute: 1.2 10*3/uL — ABNORMAL HIGH (ref 0.1–1.0)
NEUTROS PCT: 82 % — AB (ref 43–77)
Neutro Abs: 7.4 10*3/uL (ref 1.7–7.7)
PLATELETS: 226 10*3/uL (ref 150–400)
RBC: 3.79 MIL/uL — ABNORMAL LOW (ref 3.87–5.11)
RDW: 16.8 % — AB (ref 11.5–15.5)
WBC: 9.1 10*3/uL (ref 4.0–10.5)

## 2014-09-09 LAB — TYPE AND SCREEN
ABO/RH(D): O NEG
ANTIBODY SCREEN: NEGATIVE
UNIT DIVISION: 0
Unit division: 0

## 2014-09-09 LAB — GLUCOSE, CAPILLARY
GLUCOSE-CAPILLARY: 157 mg/dL — AB (ref 65–99)
GLUCOSE-CAPILLARY: 271 mg/dL — AB (ref 65–99)
Glucose-Capillary: 111 mg/dL — ABNORMAL HIGH (ref 65–99)
Glucose-Capillary: 414 mg/dL — ABNORMAL HIGH (ref 65–99)
Glucose-Capillary: 112 mg/dL — ABNORMAL HIGH (ref 65–99)
Glucose-Capillary: 353 mg/dL — ABNORMAL HIGH (ref 65–99)

## 2014-09-09 LAB — MAGNESIUM: Magnesium: 2.2 mg/dL (ref 1.7–2.4)

## 2014-09-09 MED ORDER — ALBUTEROL SULFATE (2.5 MG/3ML) 0.083% IN NEBU
2.5000 mg | INHALATION_SOLUTION | RESPIRATORY_TRACT | Status: DC | PRN
  Administered 2014-09-09: 2.5 mg via RESPIRATORY_TRACT
  Filled 2014-09-09: qty 3

## 2014-09-09 MED ORDER — METOLAZONE 5 MG PO TABS
5.0000 mg | ORAL_TABLET | Freq: Two times a day (BID) | ORAL | Status: AC
  Administered 2014-09-09 (×2): 5 mg via ORAL
  Filled 2014-09-09 (×3): qty 1

## 2014-09-09 MED ORDER — INSULIN GLARGINE 100 UNIT/ML ~~LOC~~ SOLN
15.0000 [IU] | Freq: Every day | SUBCUTANEOUS | Status: DC
  Administered 2014-09-10 – 2014-09-11 (×2): 15 [IU] via SUBCUTANEOUS
  Filled 2014-09-09 (×2): qty 0.15

## 2014-09-09 MED ORDER — METHYLPREDNISOLONE SODIUM SUCC 40 MG IJ SOLR
30.0000 mg | INTRAMUSCULAR | Status: DC
  Administered 2014-09-09: 30 mg via INTRAVENOUS
  Filled 2014-09-09 (×2): qty 0.75

## 2014-09-09 MED ORDER — POTASSIUM CHLORIDE 10 MEQ/100ML IV SOLN
10.0000 meq | INTRAVENOUS | Status: AC
  Administered 2014-09-09 (×4): 10 meq via INTRAVENOUS
  Filled 2014-09-09 (×4): qty 100

## 2014-09-09 NOTE — Progress Notes (Signed)
MD made aware of blood sugar

## 2014-09-09 NOTE — Progress Notes (Signed)
Placed patient on BIPAP for QHS, increased HR, increased RR, O2 saturation mid 80's.  Patient tolerating well at this time.

## 2014-09-09 NOTE — Progress Notes (Signed)
[place] TEAM 1 - Stepdown/ICU TEAM Progress Note  Haw[place] Kobler ZOX:[id number] DOB: 1931-01-15 DOA: 09/03/2014 PCP: Julian Hy, MD  Admit HPI / Brief Narrative: 81 y.o. WF PMHx Depression, CVA, HTN, CHF DM type II, COPD, presenting with cc of dyspnea. Patient is currently on Bipap and can't provide history. No family at bedside ; she is a resident at [facility]. History taking from ER physician. She reported orthopnea. She had a pneumonia three weeks ago and she reported that her symptoms now are similar to her symptoms from 3 weeks ago. No chest pain.   HPI/Subjective: 7/30  A/Ox4 patient on 6 L O2 via [place], to maintain SPO2 low 90%   Assessment/Plan: Acute diastolic CHF:  -7/24 PCXR; bibasilar opacification see results below. -Echocardiogram; consistent with diastolic CHF -Strict I&O; -2.3 L  -Daily weight 7/26 bed weight= 55.7 kg        7/30 weight= 54.6 kg -Transfuse for hemoglobin<8  Pulmonary edema? -Continue D5W-0.45% saline to [facility] -7/30 CVP=11? Repeat CVP -Continue Lasix 60 mg TID; metolazone 5 mg 2 doses -PCXR in a.m.  Sepsis due to HCAP?/Aspiration pneumonia?  -Completed 7 day course antibiotics -DuoNeb QID -Mucinex DM BID -Titrate down aggressively to my: Decrease Solu-Medrol 30 mg daily -Flutter valve  Acute respiratory failure with Hypercapnia   -Likely multifactorial due to Pneumonia vs. Acute HF vs. COPD exacerbation; troponin 2 negative  -Continue  BiPAP and oxygen therapy PRN  -Continue albuterol Q4H  Anemia -Most likely multifactorial to include sepsis, dilutional, underproduction.  -Obtain anemia panel, haptoglobin, LDH -7/26 Type and cross and transfuse 1 unit PRBC -7/29 transfuse 1 unit PRBC  Decreasing platelets (sepsis vs hit?) -Covered -DC all heparin containing products -Hit panel negative  Hypokalemia -Potassium goal> 4 -Potassium IV 10 meq 4 runs  Hypomagnesemia -Magnesium goal> 2  DM Type II uncontrolled -7/24  Hemoglobin A1c= 7.5  -Increase to resistant SSI -Increase Lantus 15 mg daily  HLD -Lipid panel; slightly outside ADA guidelines -Increase Lipitor to 20 mg daily  Acute on chronic kidney failure (Cr Baseline ~ 1.3).  -Due to HF vs. Sepsis -Cr baseline  Anxiety -continue Ativan 0.25 mg daily PRN   -Continue  Cymbalta 60 mg daily  Altered mental status -Most likely multifactorial to include sepsis, acute/subacute stroke; appears to be clearing  Acute stroke on chronic strokes -Patient's history of present illness is consistent with at least a subacute stroke, most likely occurring~3 weeks ago. -7/24 CT head shows probable acute stroke with multiple chronic stroke. See results below -Allow for permissive HTN -Carotid Dopplers; see results below -MRI brain; shows multiple chronic strokes, but negative acute stroke.   Stenosis right external carotid artery -Most likely not a surgical candidate but will contact vascular surgery   Tobacco abuse - Patient is a 1 PPD 50 years smoker (stopped July 2010).      Code Status: FULL Family Communication: Spoke sister/sister-in-law Disposition Plan: Resolution sepsis/completion of stroke workup    Consultants: Dr. Luanna Cole (PCCM)    Procedure/Significant Events: 7/24 PCXR; poor respiratory effort resulted in hyperexpansion, bibasilar airspace opacification to my: Pneumonia? 7/24 CT head without contrast; Atrophy with multiple areas of prior infarct.  -Suspect recent infarct Rt cerebellum slightly inferior to the right dentate nucleus.  -Age uncertain infarct Rt thalamus. -Chronic infarct Lt thalamus as well as in the tail of the caudate nucleus 7/25 renal ultrasound;-Rt kidney atrophic w/ thinning and increased echogenicity renal parenchyma, suggestive of chronic renal Dz 7/25 echocardiogram;- Left ventricle: severe LVH. -  LVEF= 60%- 65%.  -Dynamic obstruction. Dynamic Obstruction, with a peak gradient of 78 mm Hg.-Relaxation  (grade 1 diastolic dysfunction). 7/26 carotid Dopplers; Rt - 1-39% ICA stenosis.Rt ECA stenosis. Left - 40% to 59% ICA stenosis. Vertebral artery flow is antegrade. 7/29 MRA/MRI brain; -lacunar infarcts of the thalami are remote bilaterally.  - remote posterior cerebellar infarct - Extensive periventricular/subcortical confluent white matter changes  present bilaterally with extension i/t brainstem.   Culture 7/24 MRSA by PCR negative 7/24 blood left/right arm NGTD 7/24 urine negative 7/24 strep pneumo/Legionella urine antigen negative   Antibiotics: Zosyn 7/24>> stopped 7/30 Vancomycin 7/24>> stopped 7/30   DVT prophylaxis: Lovenox   Devices    LINES / TUBES:      Continuous Infusions: . dextrose 5 % and 0.45% NaCl 10 mL/hr at 09/05/14 1034    Objective: VITAL SIGNS: Temp: 97.7 F (36.5 C) (07/30 1210) Temp Source: Oral (07/30 1210) BP: 128/48 mmHg (07/30 1210) Pulse Rate: 99 (07/30 1210) SPO2; 91% on 6 L O2 via [place] FIO2:   Intake/Output Summary (Last 24 hours) at 09/09/14 1350 Last data filed at 09/09/14 1100  Gross per 24 hour  Intake 1238.83 ml  Output    850 ml  Net 388.83 ml     Exam: General: A/O 4, sitting comfortably in bed eating much, on 6 L O2 via [place], Eyes: Pupils equal round reactive to light and accommodation     ENT: negative gingival bleeding Neck:  Negative scars, masses, torticollis, lymphadenopathy, JVD Lungs: poor air movement all lung fields,  negative expiratory wheezing, negative crackles.  Cardiovascular: Regular rate and rhythm without murmur gallop or rub normal S1 and S2 Abdomen: Soft, nontender, plus bowel sounds  Extremities: No significant cyanosis, clubbing, or edema bilateral lower extremities Psychiatric:  Unable to assess  Neurologic:  Follows all commands right sided hemiparesis,   Data Reviewed: Basic Metabolic Panel:  Recent Labs Lab 09/04/14 0413 09/05/14 0612 09/06/14 0340 09/07/14 0430 09/08/14 0517  09/09/14 0524  NA 145 140 141 142 138  --   K 3.4* 3.2* 4.2 3.8 3.3*  --   CL 103 104 100* 96* 92*  --   CO2 32 30 34* 36* 39*  --   GLUCOSE 264* 232* 132* 177* 105*  --   BUN 38* 27* 36* 31* 30*  --   CREATININE 1.64* 1.24* 1.29* 1.24* 1.33*  --   CALCIUM 8.8* 7.9* 8.9 9.0 9.3  --   MG 2.1 1.7 2.4 2.1 1.9 2.2   Liver Function Tests:  Recent Labs Lab 09/04/14 0413 09/05/14 0612 09/06/14 0340 09/07/14 0430 09/08/14 0517  AST 10* 11* 14* 15 15  ALT 11* 9* 13* 14 15  ALKPHOS 57 42 54 61 59  BILITOT 0.8 1.3* 0.8 1.0 0.8  PROT 4.9* 4.0* 5.3* 5.3* 5.5*  ALBUMIN 2.3* 1.9* 2.5* 2.4* 2.5*   No results for input(s): LIPASE, AMYLASE in the last 168 hours. No results for input(s): AMMONIA in the last 168 hours. CBC:  Recent Labs Lab 09/05/14 0612 09/06/14 0340 09/07/14 0430 09/08/14 0517 09/08/14 2337 09/09/14 0524  WBC 5.3 12.8* 10.4 10.3  --  9.1  NEUTROABS 3.7 10.6* 8.4* 9.1*  --  7.4  HGB 7.7* 10.5* 10.6* 7.9* 11.5* 11.8*  HCT 23.8* 32.0* 32.9* 24.4* 34.9* 36.1  MCV 103.9* 100.3* 101.9* 98.8  --  95.3  PLT 179 215 204 272  --  226   Cardiac Enzymes:  Recent Labs Lab 09/03/14 0309 09/03/14 1055  TROPONINI  0.03 0.03   BNP (last 3 results)  Recent Labs  09/03/14 0309  BNP 377.8*    ProBNP (last 3 results) No results for input(s): PROBNP in the last 8760 hours.  CBG:  Recent Labs Lab 09/08/14 2006 09/09/14 0025 09/09/14 0444 09/09/14 0746 09/09/14 1206  GLUCAP 237* 271* 111* 157* 414*    Recent Results (from the past 240 hour(s))  MRSA PCR Screening     Status: None   Collection Time: 09/03/14  8:41 AM  Result Value Ref Range Status   MRSA by PCR NEGATIVE NEGATIVE Final    Comment:        The GeneXpert MRSA Assay (FDA approved for NASAL specimens only), is one component of a comprehensive MRSA colonization surveillance program. It is not intended to diagnose MRSA infection nor to guide or monitor treatment for MRSA infections.     Culture, blood (routine x 2)     Status: None   Collection Time: 09/03/14  9:56 AM  Result Value Ref Range Status   Specimen Description BLOOD LEFT ARM  Final   Special Requests IN PEDIATRIC BOTTLE 1CC  Final   Culture NO GROWTH 5 DAYS  Final   Report Status 09/08/2014 FINAL  Final  Culture, blood (routine x 2)     Status: None   Collection Time: 09/03/14 11:10 AM  Result Value Ref Range Status   Specimen Description BLOOD RIGHT ARM  Final   Special Requests BOTTLES DRAWN AEROBIC ONLY 7CC  Final   Culture NO GROWTH 5 DAYS  Final   Report Status 09/08/2014 FINAL  Final  Culture, Urine     Status: None   Collection Time: 09/03/14 11:44 AM  Result Value Ref Range Status   Specimen Description URINE, RANDOM  Final   Special Requests NONE  Final   Culture NO GROWTH 1 DAY  Final   Report Status 09/05/2014 FINAL  Final     Studies:  Recent x-ray studies have been reviewed in detail by the Attending Physician  Scheduled Meds:  Scheduled Meds: . sodium chloride   Intravenous Once  . sodium chloride   Intravenous Once  . amLODipine  10 mg Oral Daily  . atorvastatin  20 mg Oral Daily  . dextromethorphan-guaiFENesin  1 tablet Oral BID  . docusate sodium  100 mg Oral BID  . DULoxetine  60 mg Oral Daily  . feeding supplement (ENSURE ENLIVE)  237 mL Oral BID BM  . furosemide  60 mg Intravenous TID  . hydrALAZINE  100 mg Oral 3 times per day  . insulin aspart  0-20 Units Subcutaneous 6 times per day  . [START ON 09/10/2014] insulin glargine  15 Units Subcutaneous Daily  . ipratropium-albuterol  3 mL Nebulization TID  . LORazepam  0.5-2 mg Intravenous Once  . methylPREDNISolone (SOLU-MEDROL) injection  30 mg Intravenous Q24H  . metolazone  5 mg Oral BID  . piperacillin-tazobactam (ZOSYN)  IV  3.375 g Intravenous 3 times per day  . sodium chloride  10-40 mL Intracatheter Q12H    Time spent on care of this patient: 40 mins   WOODS, Roselind Messier , MD  [facility] Office   [phone] Pager - [phone]  On-Call/Text Page:      [email or web address]      password [email or web address]  If 7PM-7AM, please contact night-coverage [email or web address] Password [email or web address] 09/09/2014, 1:50 PM   LOS: 6 days   Care during the described time interval was provided by me .  I have reviewed this  patient's available data, including medical history, events of note, physical examination, and all test results as part of my evaluation. I have personally reviewed and interpreted all radiology studies.   Dia Crawford, MD [phone] Pager

## 2014-09-09 NOTE — Progress Notes (Signed)
CRITICAL VALUE ALERT  Critical value received: K+ 2.7  Date of notification:  09/09/14  Time of notification:  1600  Critical value read back:yes   Nurse who received alert:  Arman Bogus Rn  MD notified (1st page): Dr Joseph Art via Text page

## 2014-09-10 ENCOUNTER — Inpatient Hospital Stay (HOSPITAL_COMMUNITY): Payer: Medicare Other

## 2014-09-10 LAB — CBC WITH DIFFERENTIAL/PLATELET
Basophils Absolute: 0 10*3/uL (ref 0.0–0.1)
Basophils Relative: 0 % (ref 0–1)
Eosinophils Absolute: 0 10*3/uL (ref 0.0–0.7)
Eosinophils Relative: 0 % (ref 0–5)
HCT: 36.9 % (ref 36.0–46.0)
HEMOGLOBIN: 12.2 g/dL (ref 12.0–15.0)
LYMPHS ABS: 0.7 10*3/uL (ref 0.7–4.0)
LYMPHS PCT: 6 % — AB (ref 12–46)
MCH: 31.9 pg (ref 26.0–34.0)
MCHC: 33.1 g/dL (ref 30.0–36.0)
MCV: 96.3 fL (ref 78.0–100.0)
Monocytes Absolute: 1.6 10*3/uL — ABNORMAL HIGH (ref 0.1–1.0)
Monocytes Relative: 15 % — ABNORMAL HIGH (ref 3–12)
NEUTROS PCT: 79 % — AB (ref 43–77)
Neutro Abs: 8.4 10*3/uL — ABNORMAL HIGH (ref 1.7–7.7)
Platelets: 268 10*3/uL (ref 150–400)
RBC: 3.83 MIL/uL — ABNORMAL LOW (ref 3.87–5.11)
RDW: 16.2 % — ABNORMAL HIGH (ref 11.5–15.5)
WBC: 10.7 10*3/uL — ABNORMAL HIGH (ref 4.0–10.5)

## 2014-09-10 LAB — BASIC METABOLIC PANEL
ANION GAP: 12 (ref 5–15)
BUN: 44 mg/dL — ABNORMAL HIGH (ref 6–20)
CALCIUM: 9.6 mg/dL (ref 8.9–10.3)
CO2: 41 mmol/L — ABNORMAL HIGH (ref 22–32)
Chloride: 85 mmol/L — ABNORMAL LOW (ref 101–111)
Creatinine, Ser: 1.4 mg/dL — ABNORMAL HIGH (ref 0.44–1.00)
GFR calc non Af Amer: 34 mL/min — ABNORMAL LOW (ref >60)
GFR, EST AFRICAN AMERICAN: 39 mL/min — AB (ref >60)
GLUCOSE: 72 mg/dL (ref 65–99)
POTASSIUM: 3.6 mmol/L (ref 3.5–5.1)
SODIUM: 138 mmol/L (ref 135–145)

## 2014-09-10 LAB — GLUCOSE, CAPILLARY
Glucose-Capillary: 221 mg/dL — ABNORMAL HIGH (ref 65–99)
Glucose-Capillary: 230 mg/dL — ABNORMAL HIGH (ref 65–99)
Glucose-Capillary: 249 mg/dL — ABNORMAL HIGH (ref 65–99)
Glucose-Capillary: 410 mg/dL — ABNORMAL HIGH (ref 65–99)
Glucose-Capillary: 465 mg/dL — ABNORMAL HIGH (ref 65–99)
Glucose-Capillary: 86 mg/dL (ref 65–99)

## 2014-09-10 LAB — MAGNESIUM: MAGNESIUM: 2.1 mg/dL (ref 1.7–2.4)

## 2014-09-10 MED ORDER — POTASSIUM CHLORIDE 10 MEQ/100ML IV SOLN
10.0000 meq | INTRAVENOUS | Status: AC
Start: 2014-09-10 — End: 2014-09-10
  Administered 2014-09-10 (×2): 10 meq via INTRAVENOUS
  Filled 2014-09-10 (×2): qty 100

## 2014-09-10 MED ORDER — METHYLPREDNISOLONE SODIUM SUCC 40 MG IJ SOLR
15.0000 mg | INTRAMUSCULAR | Status: DC
  Administered 2014-09-10: 15.2 mg via INTRAVENOUS
  Filled 2014-09-10 (×2): qty 0.38

## 2014-09-10 NOTE — Evaluation (Signed)
Physical Therapy Evaluation/ Discharge Patient Details Name: Michelle Bowers MRN: [id number] DOB: 03-Sep-1930 Today's Date: 09/10/2014   History of Present Illness  [name] is a 81 y.o. female with history of HTN, DM, COPD, CHF presenting with cc of dyspnea and aspiration PNA from [facility] where she has resided since CVA  Clinical Impression  Pt with baseline dependent function since CVA reportedly 6 months ago. Pt states she is happy at [facility] with plans to return there and has been total assist with all mobility and ADLs since her stroke. Pt states therapy no longer works with her but will defer all further needs to SNF. Pt with sats dropping to 84% on 6L with transfer to EOB and unable to transfer OOB at this time. Recommend lift for OOB with nursing as well as ROM, RUE elevation and floating heels. No further acute therapy needs with pt in agreement and will defer to SNF.     Follow Up Recommendations SNF    Equipment Recommendations  None recommended by PT    Recommendations for Other Services       Precautions / Restrictions Precautions Precautions: Fall Precaution Comments: right hemiplegia      Mobility  Bed Mobility Overal bed mobility: Needs Assistance Bed Mobility: Rolling;Sidelying to Sit;Sit to Supine Rolling: Max assist Sidelying to sit: Max assist   Sit to supine: Total assist   General bed mobility comments: cues with pt utilizing LUE to assist with rolling and pushing to sitting. Pt with desaturation to 83% on 6L with transfer to EOB and unable to progress mobility at this time with return to supine.   Transfers                    Ambulation/Gait                Stairs            Wheelchair Mobility    Modified Rankin (Stroke Patients Only)       Balance Overall balance assessment: Needs assistance   Sitting balance-Leahy Scale: Zero Sitting balance - Comments: max assist EOB secondary to right posterior  lean Postural control: Right lateral lean;Posterior lean                                   Pertinent Vitals/Pain Pain Assessment: No/denies pain  95% on 6L at rest    [name] expects to be discharged to:: Skilled nursing facility                      Prior Function Level of Independence: Needs assistance   Gait / Transfers Assistance Needed: max assist for rolling, total assist for transfers OOB  ADL's / Homemaking Assistance Needed: total assist for bathing/dressing, states she can self feed        Hand Dominance        Extremity/Trunk Assessment   Upper Extremity Assessment: RUE deficits/detail;LUE deficits/detail RUE Deficits / Details: right hemiplegia     LUE Deficits / Details: weak but functionally 3/5   Lower Extremity Assessment: RLE deficits/detail;LLE deficits/detail RLE Deficits / Details: right hemiplegia LLE Deficits / Details: weak but functionally 3/5  Cervical / Trunk Assessment: Kyphotic  Communication   Communication: No difficulties  Cognition Arousal/Alertness: Awake/alert Behavior During Therapy: Flat affect Overall Cognitive Status: No family/caregiver present to determine baseline cognitive functioning  Memory: Decreased short-term memory              General Comments      Exercises        Assessment/Plan    PT Assessment All further PT needs can be met in the next venue of care  PT Diagnosis Generalized weakness;Hemiplegia dominant side;Altered mental status   PT Problem List Decreased strength;Decreased cognition;Decreased activity tolerance;Decreased balance;Decreased safety awareness;Decreased mobility  PT Treatment Interventions     PT Goals (Current goals can be found in the Care Plan section) Acute Rehab PT Goals PT Goal Formulation: All assessment and education complete, DC therapy    Frequency     Barriers to discharge        Co-evaluation                End of Session   Activity Tolerance: Patient limited by fatigue;Treatment limited secondary to medical complications (Comment) Patient left: in bed;with call bell/phone within reach;with bed alarm set Nurse Communication: Mobility status;Need for lift equipment;Precautions         Time: 9144-4584 PT Time Calculation (min) (ACUTE ONLY): 19 min   Charges:   PT Evaluation $Initial PT Evaluation Tier I: 1 Procedure     PT G CodesMelford Aase 09/10/2014, 11:57 AM Elwyn Reach, [place]

## 2014-09-10 NOTE — Progress Notes (Addendum)
[facility] TEAM 1 - Stepdown/ICU TEAM Progress Note  [place] Karpowicz JXB:[id number] DOB: [birth year] DOA: 09/03/2014 PCP: Julian Hy, MD  Admit HPI / Brief Narrative: 81 y.o. WF PMHx Depression, CVA, HTN, CHF DM type II, COPD (chronic O2 4 L O2 via [place] during the day/BiPAP at night), presenting with cc of dyspnea. Patient is currently on Bipap and can't provide history. No family at bedside ; she is a resident at [facility]. History taking from ER physician. She reported orthopnea. She had a pneumonia three weeks ago and she reported that her symptoms now are similar to her symptoms from 3 weeks ago. No chest pain.   HPI/Subjective: 7/31  A/Ox4 patient on 6 L O2 via [place], to maintain SPO2 low 90%. States feels significantly improved and believes back to baseline.   Assessment/Plan: Acute diastolic CHF:  -7/24 PCXR; bibasilar opacification see results below. -Echocardiogram; consistent with diastolic CHF -Strict I&O; -2.0 L  -Daily weight 7/26 bed weight= 55.7 kg        7/31 weight= 56.6 kg kg -Transfuse for hemoglobin<8  Pulmonary edema? -Continue D5W-0.45% saline to [facility] -7/31 CVP=7 -Continue Lasix 60 mg TID   Sepsis due to HCAP?/Aspiration pneumonia?  -Completed 7 day course antibiotics -DuoNeb QID -Mucinex DM BID -Titrate down aggressively: 7/31 Decrease Solu-Medrol 15 mg daily -Flutter valve  Acute respiratory failure with Hypercapnia   -Likely multifactorial due to Pneumonia vs. Acute HF vs. COPD exacerbation; troponin 2 negative  -Continue  BiPAP and oxygen therapy PRN  -Continue albuterol Q4H  Anemia -Most likely multifactorial to include sepsis, dilutional, underproduction.  -7/26 Type and cross and transfuse 1 unit PRBC -7/29 transfuse 1 unit PRBC -7/31 resolved  Decreasing platelets (sepsis vs hit?) -Hit panel negative -Resolved  Hypokalemia -Potassium goal> 4 -Potassium IV 10 meq 2 runs  Hypomagnesemia -Magnesium goal> 2  DM Type II  uncontrolled -7/24 Hemoglobin A1c= 7.5  -Continue resistant SSI -Continue Lantus 15 mg daily  HLD -Lipid panel; slightly outside ADA guidelines -Increase Lipitor to 20 mg daily  Acute on chronic kidney failure (Cr Baseline ~ 1.3).  -Due to HF vs. Sepsis -Cr slightly above baseline  Anxiety -continue Ativan 0.25 mg daily PRN   -Continue  Cymbalta 60 mg daily  Altered mental status -Most likely multifactorial to include sepsis, acute/subacute stroke; appears to be clearing  Acute stroke on chronic strokes -Patient's history of present illness is consistent with at least a subacute stroke, most likely occurring~3 weeks ago. -7/24 CT head shows probable acute stroke with multiple chronic stroke. See results below -Allow for permissive HTN -Carotid Dopplers; see results below -MRI brain; shows multiple chronic strokes, but negative acute stroke.   Stenosis right external carotid artery -Most likely not a surgical candidate. Spoke with Dr. Imogene Burn (vascular surgery) no treatment indicated  Tobacco abuse - Patient is a 1 PPD 50 years smoker (stopped July 2010).      Code Status: FULL Family Communication: None present  Disposition Plan: Discharge in 24-48 hours to [facility] SNF     Consultants: Dr. Luanna Cole (PCCM)  Dr. Imogene Burn (vascular surgery)    Procedure/Significant Events: 7/24 PCXR; poor respiratory effort resulted in hyperexpansion, bibasilar airspace opacification to my: Pneumonia? 7/24 CT head without contrast; Atrophy with multiple areas of prior infarct.  -Suspect recent infarct Rt cerebellum slightly inferior to the right dentate nucleus.  -Age uncertain infarct Rt thalamus. -Chronic infarct Lt thalamus as well as in the tail of the caudate nucleus 7/25 renal ultrasound;-Rt kidney atrophic w/  thinning and increased echogenicity renal parenchyma, suggestive of chronic renal Dz 7/25 echocardiogram;- Left ventricle: severe LVH. -LVEF= 60%- 65%.  -Dynamic  obstruction. Dynamic Obstruction, with a peak gradient of 78 mm Hg.-Relaxation (grade 1 diastolic dysfunction). 7/26 carotid Dopplers; Rt - 1-39% ICA stenosis.Rt ECA stenosis. Left - 40% to 59% ICA stenosis. Vertebral artery flow is antegrade. 7/29 MRA/MRI brain; -lacunar infarcts of the thalami are remote bilaterally.  - remote posterior cerebellar infarct - Extensive periventricular/subcortical confluent white matter changes  present bilaterally with extension i/t brainstem.   Culture 7/24 MRSA by PCR negative 7/24 blood left/right arm NGTD 7/24 urine negative 7/24 strep pneumo/Legionella urine antigen negative   Antibiotics: Zosyn 7/24>> stopped 7/30 Vancomycin 7/24>> stopped 7/30   DVT prophylaxis: Lovenox   Devices    LINES / TUBES:      Continuous Infusions: . dextrose 5 % and 0.45% NaCl 10 mL/hr at 09/05/14 1034    Objective: VITAL SIGNS: Temp: 97.7 F (36.5 C) (07/31 0400) Temp Source: Axillary (07/31 0400) BP: 153/51 mmHg (07/31 0400) Pulse Rate: 82 (07/31 0400) SPO2; 94% on BiPAP FIO2: 50%   Intake/Output Summary (Last 24 hours) at 09/10/14 0738 Last data filed at 09/10/14 0600  Gross per 24 hour  Intake    640 ml  Output    350 ml  Net    290 ml     Exam: General: A/O 4, sitting comfortably in bed, on 6 L O2 via [place], Eyes: Pupils equal round reactive to light and accommodation     ENT: negative gingival bleeding Neck:  Negative scars, masses, torticollis, lymphadenopathy, JVD Lungs: left lung fields clear to auscultation, right upper/middle lobe clear to auscultation, RLL breath sounds present but decreased. Negative wheezing, negative crackles.  Cardiovascular: Regular rate and rhythm without murmur gallop or rub normal S1 and S2 Abdomen: Soft, nontender, plus bowel sounds  Extremities: No significant cyanosis, clubbing, or edema bilateral lower extremities Psychiatric:  Unable to assess  Neurologic:  Follows all commands right sided  hemiparesis,   Data Reviewed: Basic Metabolic Panel:  Recent Labs Lab 09/06/14 0340 09/07/14 0430 09/08/14 0517 09/09/14 0524 09/09/14 1400 09/10/14 0454  NA 141 142 138  --  134* 138  K 4.2 3.8 3.3*  --  2.7* 3.6  CL 100* 96* 92*  --  86* 85*  CO2 34* 36* 39*  --  38* 41*  GLUCOSE 132* 177* 105*  --  324* 72  BUN 36* 31* 30*  --  36* 44*  CREATININE 1.29* 1.24* 1.33*  --  1.54* 1.40*  CALCIUM 8.9 9.0 9.3  --  9.1 9.6  MG 2.4 2.1 1.9 2.2  --  2.1   Liver Function Tests:  Recent Labs Lab 09/04/14 0413 09/05/14 0612 09/06/14 0340 09/07/14 0430 09/08/14 0517  AST 10* 11* 14* 15 15  ALT 11* 9* 13* 14 15  ALKPHOS 57 42 54 61 59  BILITOT 0.8 1.3* 0.8 1.0 0.8  PROT 4.9* 4.0* 5.3* 5.3* 5.5*  ALBUMIN 2.3* 1.9* 2.5* 2.4* 2.5*   No results for input(s): LIPASE, AMYLASE in the last 168 hours. No results for input(s): AMMONIA in the last 168 hours. CBC:  Recent Labs Lab 09/06/14 0340 09/07/14 0430 09/08/14 0517 09/08/14 2337 09/09/14 0524 09/10/14 0454  WBC 12.8* 10.4 10.3  --  9.1 10.7*  NEUTROABS 10.6* 8.4* 9.1*  --  7.4 8.4*  HGB 10.5* 10.6* 7.9* 11.5* 11.8* 12.2  HCT 32.0* 32.9* 24.4* 34.9* 36.1 36.9  MCV 100.3* 101.9*  98.8  --  95.3 96.3  PLT 215 204 272  --  226 268   Cardiac Enzymes:  Recent Labs Lab 09/03/14 1055  TROPONINI 0.03   BNP (last 3 results)  Recent Labs  09/03/14 0309  BNP 377.8*    ProBNP (last 3 results) No results for input(s): PROBNP in the last 8760 hours.  CBG:  Recent Labs Lab 09/09/14 1206 09/09/14 1646 09/09/14 2003 09/10/14 0002 09/10/14 0542  GLUCAP 414* 112* 353* 230* 86    Recent Results (from the past 240 hour(s))  MRSA PCR Screening     Status: None   Collection Time: 09/03/14  8:41 AM  Result Value Ref Range Status   MRSA by PCR NEGATIVE NEGATIVE Final    Comment:        The GeneXpert MRSA Assay (FDA approved for NASAL specimens only), is one component of a comprehensive MRSA  colonization surveillance program. It is not intended to diagnose MRSA infection nor to guide or monitor treatment for MRSA infections.   Culture, blood (routine x 2)     Status: None   Collection Time: 09/03/14  9:56 AM  Result Value Ref Range Status   Specimen Description BLOOD LEFT ARM  Final   Special Requests IN PEDIATRIC BOTTLE 1CC  Final   Culture NO GROWTH 5 DAYS  Final   Report Status 09/08/2014 FINAL  Final  Culture, blood (routine x 2)     Status: None   Collection Time: 09/03/14 11:10 AM  Result Value Ref Range Status   Specimen Description BLOOD RIGHT ARM  Final   Special Requests BOTTLES DRAWN AEROBIC ONLY 7CC  Final   Culture NO GROWTH 5 DAYS  Final   Report Status 09/08/2014 FINAL  Final  Culture, Urine     Status: None   Collection Time: 09/03/14 11:44 AM  Result Value Ref Range Status   Specimen Description URINE, RANDOM  Final   Special Requests NONE  Final   Culture NO GROWTH 1 DAY  Final   Report Status 09/05/2014 FINAL  Final     Studies:  Recent x-ray studies have been reviewed in detail by the Attending Physician  Scheduled Meds:  Scheduled Meds: . sodium chloride   Intravenous Once  . sodium chloride   Intravenous Once  . amLODipine  10 mg Oral Daily  . atorvastatin  20 mg Oral Daily  . dextromethorphan-guaiFENesin  1 tablet Oral BID  . docusate sodium  100 mg Oral BID  . DULoxetine  60 mg Oral Daily  . feeding supplement (ENSURE ENLIVE)  237 mL Oral BID BM  . furosemide  60 mg Intravenous TID  . hydrALAZINE  100 mg Oral 3 times per day  . insulin aspart  0-20 Units Subcutaneous 6 times per day  . insulin glargine  15 Units Subcutaneous Daily  . ipratropium-albuterol  3 mL Nebulization TID  . LORazepam  0.5-2 mg Intravenous Once  . methylPREDNISolone (SOLU-MEDROL) injection  30 mg Intravenous Q24H  . sodium chloride  10-40 mL Intracatheter Q12H    Time spent on care of this patient: 40 mins   Shepherd Finnan, Roselind Messier , MD  [facility] Office  [phone] Pager - [phone]  On-Call/Text Page:      [email or web address]      password [email or web address]  If 7PM-7AM, please contact night-coverage [email or web address] Password [email or web address] 09/10/2014, 7:38 AM   LOS: 7 days   Care during the described time interval was provided by me .  I have reviewed  this patient's available data, including medical history, events of note, physical examination, and all test results as part of my evaluation. I have personally reviewed and interpreted all radiology studies.   Dia Crawford, MD [phone] Pager

## 2014-09-10 NOTE — Progress Notes (Signed)
Resp Assess: Pt is comfortable at this time. NO SOB or dyspnea. WOB normal. Pt says she feels good at this time. BIPAP is at bedside if needed. Per MD order BIPAP is to be used PRN. RT will monitor pt.

## 2014-09-10 NOTE — Progress Notes (Signed)
Text page to Dr.Woods regarding patient CBG of 465. Per doctor, give patient max dose on sliding scale: 20 units of insulin.

## 2014-09-11 DIAGNOSIS — R410 Disorientation, unspecified: Secondary | ICD-10-CM

## 2014-09-11 LAB — CBC WITH DIFFERENTIAL/PLATELET
BASOS ABS: 0 10*3/uL (ref 0.0–0.1)
BASOS PCT: 0 % (ref 0–1)
Eosinophils Absolute: 0.2 10*3/uL (ref 0.0–0.7)
Eosinophils Relative: 1 % (ref 0–5)
HEMATOCRIT: 37.2 % (ref 36.0–46.0)
Hemoglobin: 12.2 g/dL (ref 12.0–15.0)
Lymphocytes Relative: 7 % — ABNORMAL LOW (ref 12–46)
Lymphs Abs: 0.8 10*3/uL (ref 0.7–4.0)
MCH: 31.9 pg (ref 26.0–34.0)
MCHC: 32.8 g/dL (ref 30.0–36.0)
MCV: 97.1 fL (ref 78.0–100.0)
Monocytes Absolute: 2.6 10*3/uL — ABNORMAL HIGH (ref 0.1–1.0)
Monocytes Relative: 22 % — ABNORMAL HIGH (ref 3–12)
NEUTROS PCT: 70 % (ref 43–77)
Neutro Abs: 8.4 10*3/uL — ABNORMAL HIGH (ref 1.7–7.7)
Platelets: 269 10*3/uL (ref 150–400)
RBC: 3.83 MIL/uL — ABNORMAL LOW (ref 3.87–5.11)
RDW: 15.5 % (ref 11.5–15.5)
WBC: 12.1 10*3/uL — ABNORMAL HIGH (ref 4.0–10.5)

## 2014-09-11 LAB — GLUCOSE, CAPILLARY
GLUCOSE-CAPILLARY: 112 mg/dL — AB (ref 65–99)
GLUCOSE-CAPILLARY: 408 mg/dL — AB (ref 65–99)
Glucose-Capillary: 43 mg/dL — CL (ref 65–99)
Glucose-Capillary: 86 mg/dL (ref 65–99)
Glucose-Capillary: 135 mg/dL — ABNORMAL HIGH (ref 65–99)
Glucose-Capillary: 154 mg/dL — ABNORMAL HIGH (ref 65–99)
Glucose-Capillary: 89 mg/dL (ref 65–99)

## 2014-09-11 LAB — BASIC METABOLIC PANEL
Anion gap: 8 (ref 5–15)
BUN: 46 mg/dL — ABNORMAL HIGH (ref 6–20)
CHLORIDE: 83 mmol/L — AB (ref 101–111)
CO2: 46 mmol/L — ABNORMAL HIGH (ref 22–32)
CREATININE: 1.37 mg/dL — AB (ref 0.44–1.00)
Calcium: 9.7 mg/dL (ref 8.9–10.3)
GFR calc non Af Amer: 35 mL/min — ABNORMAL LOW (ref >60)
GFR, EST AFRICAN AMERICAN: 40 mL/min — AB (ref >60)
Glucose, Bld: 53 mg/dL — ABNORMAL LOW (ref 65–99)
Potassium: 3.7 mmol/L (ref 3.5–5.1)
Sodium: 137 mmol/L (ref 135–145)

## 2014-09-11 LAB — MAGNESIUM: MAGNESIUM: 2.1 mg/dL (ref 1.7–2.4)

## 2014-09-11 MED ORDER — ENOXAPARIN SODIUM 30 MG/0.3ML ~~LOC~~ SOLN
30.0000 mg | SUBCUTANEOUS | Status: DC
  Administered 2014-09-12: 30 mg via SUBCUTANEOUS
  Filled 2014-09-11: qty 0.3

## 2014-09-11 MED ORDER — INSULIN ASPART 100 UNIT/ML ~~LOC~~ SOLN
5.0000 [IU] | Freq: Three times a day (TID) | SUBCUTANEOUS | Status: DC
  Administered 2014-09-11 – 2014-09-12 (×4): 5 [IU] via SUBCUTANEOUS

## 2014-09-11 MED ORDER — DM-GUAIFENESIN ER 30-600 MG PO TB12
1.0000 | ORAL_TABLET | Freq: Two times a day (BID) | ORAL | Status: DC | PRN
  Filled 2014-09-11: qty 1

## 2014-09-11 MED ORDER — ENOXAPARIN SODIUM 40 MG/0.4ML ~~LOC~~ SOLN
40.0000 mg | SUBCUTANEOUS | Status: DC
Start: 2014-09-11 — End: 2014-09-11
  Administered 2014-09-11: 40 mg via SUBCUTANEOUS
  Filled 2014-09-11: qty 0.4

## 2014-09-11 MED ORDER — DEXTROSE 50 % IV SOLN
INTRAVENOUS | Status: AC
Start: 2014-09-11 — End: 2014-09-11
  Filled 2014-09-11: qty 50

## 2014-09-11 MED ORDER — INSULIN ASPART 100 UNIT/ML ~~LOC~~ SOLN
0.0000 [IU] | Freq: Three times a day (TID) | SUBCUTANEOUS | Status: DC
  Administered 2014-09-11: 4 [IU] via SUBCUTANEOUS
  Administered 2014-09-11: 20 [IU] via SUBCUTANEOUS
  Administered 2014-09-12: 4 [IU] via SUBCUTANEOUS
  Administered 2014-09-12: 11 [IU] via SUBCUTANEOUS

## 2014-09-11 MED ORDER — DEXTROSE 50 % IV SOLN
25.0000 mL | Freq: Once | INTRAVENOUS | Status: AC
  Administered 2014-09-11: 25 mL via INTRAVENOUS

## 2014-09-11 MED ORDER — INSULIN DETEMIR 100 UNIT/ML ~~LOC~~ SOLN
10.0000 [IU] | Freq: Every day | SUBCUTANEOUS | Status: DC
  Administered 2014-09-12: 10 [IU] via SUBCUTANEOUS
  Filled 2014-09-11 (×2): qty 0.1

## 2014-09-11 MED ORDER — INSULIN DETEMIR 100 UNIT/ML ~~LOC~~ SOLN
10.0000 [IU] | Freq: Every day | SUBCUTANEOUS | Status: DC
  Filled 2014-09-11: qty 0.1

## 2014-09-11 MED ORDER — INSULIN DETEMIR 100 UNIT/ML ~~LOC~~ SOLN
8.0000 [IU] | Freq: Every day | SUBCUTANEOUS | Status: DC
  Administered 2014-09-11: 8 [IU] via SUBCUTANEOUS
  Filled 2014-09-11 (×2): qty 0.08

## 2014-09-11 MED ORDER — INSULIN ASPART 100 UNIT/ML ~~LOC~~ SOLN
22.0000 [IU] | Freq: Once | SUBCUTANEOUS | Status: AC
  Administered 2014-09-11: 22 [IU] via SUBCUTANEOUS

## 2014-09-11 NOTE — Progress Notes (Signed)
Paged Dr. Sharon Seller through Amion to notify him that the patient had a Blood Sugar of 408. Per doctor, give patient 20 units for the sugar and also give her the 5 units of meal coverage. A total of 25 units given.

## 2014-09-11 NOTE — Progress Notes (Signed)
Received call from the RN getting this patient. She called to give me report on a patient coming to our unit and we discussed giving report on this patient when she got to the unit. Report given at 1400 on [facility]. Patient transferred by bed to 6E at 1440.

## 2014-09-11 NOTE — Care Management Important Message (Signed)
Important Message  Patient Details  Name: Michelle Bowers MRN: [id number] Date of Birth: 05-26-1930   Medicare Important Message Given:  Yes-fourth notification given    Yvonna Alanis 09/11/2014, 12:19 PM

## 2014-09-11 NOTE — Progress Notes (Signed)
[facility] TEAM 1 - Stepdown/ICU TEAM Progress Note  [place] Minotti ZOX:[id number] DOB: 1930-08-31 DOA: 09/03/2014 PCP: Julian Hy, MD  Admit HPI / Brief Narrative: 81 y.o. F SNF resident w/ Hx Depression, CVA, HTN, CHF, DM2, COPD (chronic 4 L O2 via [place] during the day/BiPAP at night) who presented with cc of dyspnea.  She had a pneumonia three weeks prior and she reported that her symptoms this admit were similar.  HPI/Subjective: The pt denies any complaints whatsoever this morning.  She appears comfortable in bed.  She seems somewhat confused, but is not agitated or anxious.  She specifically denies any sob whatsoever.    Assessment/Plan:  Acute diastolic CHF exacerbation / Pulmonary edema - B pleural effusions  -Echocardiogram consistent with diastolic CHF -7/26 bed weight 81.1 kg  >  56.7kg but I/O net negative 3.5L since admit, suggesting weight not accurate  -Continue Lasix 60 mg TID, but watch climbing BUN and CO2 -continues to require higher level of O2 support than baseline 4L - attempt to wean today   Sepsis due to HCAP? / Aspiration pneumonia?  -Completed 7 day course antibiotics - f/u CXR in AM   Acute respiratory failure with Hypercapnia   -Likely multifactorial due to Pneumonia vs. Acute HF vs. COPD exacerbation -Continue  BiPAP and oxygen therapy   Anemia -Most likely multifactorial to include sepsis, dilution, underproduction  -7/26 transfused 1 unit PRBC -7/29 transfused 1 unit PRBC -Hgb stable at present   Decreased platelets (sepsis vs hit?) -Hit panel negative - resolved w/ normal plt count at this time   Hypokalemia -resolved w/ supplementation   Hypomagnesemia -resolved w/ supplementation   DM Type II - uncontrolled -7/24 A1c 7.5  -CBG very erratic, with range from 43 to 400 - adjust tx regimen today and follow CBG  HLD -Lipid panel slightly outside ADA guidelines - increased Lipitor to 20 mg daily  Acute on chronic kidney  failure Cr Baseline ~ 1.3 - crt stabilizing w/ diuresis   Anxiety -continue Ativan 0.25 mg daily PRN + Cymbalta 60 mg daily  Altered mental status -Most likely multifactorial to include sepsis, acute/subacute stroke; appears to be clearing slowly, though not yet back to apparent baseline   CVAs  -Patient's history of present illness was consistent with at least a subacute stroke, most likely occurring~3 weeks prior to admit  -7/24 CT head suggested possible acute stroke with multiple chronic strokes -Carotid Dopplers w/o evidence of critical stenosis  -MRI brain noted multiple chronic strokes, but negative acute stroke.   Stenosis right external carotid artery -spoke with Dr. Imogene Burn (Vascular Surgery) - no treatment indicated  Tobacco abuse -Patient is a 1 PPD 50 years smoker - stopped July 2010  Code Status: FULL Family Communication: No family present at time of exam  Disposition Plan: transfer to med bed - ultimate d/c to [facility] - wean O2 to baseline as able - follow CBG to stability   Consultants: PCCM Vascular Surgery  Procedure/Significant Events: 7/24 PCXR; poor respiratory effort resulted in hyperexpansion, bibasilar airspace opacification to my: Pneumonia? 7/24 CT head without contrast; Atrophy with multiple areas of prior infarct. -Suspect recent infarct Rt cerebellum slightly inferior to the right dentate nucleus. -Age uncertain infarct Rt thalamus. -Chronic infarct Lt thalamus as well as in the tail of the caudate nucleus 7/25 renal ultrasound;-Rt kidney atrophic w/ thinning and increased echogenicity renal parenchyma, suggestive of chronic renal Dz 7/25 echocardiogram;- Left ventricle: severe LVH. -LVEF= 60%- 65%.  -Dynamic obstruction. Dynamic  Obstruction, with a peak gradient of 78 mm Hg.-Relaxation (grade 1 diastolic dysfunction). 7/26 carotid Dopplers; Rt - 1-39% ICA stenosis.Rt ECA stenosis. Left - 40% to 59% ICA stenosis. Vertebral artery flow is  antegrade. 7/29 MRA/MRI brain; -lacunar infarcts of the thalami are remote bilaterally.  - remote posterior cerebellar infarct - Extensive periventricular/subcortical confluent white matter changes  present bilaterally with extension i/t brainstem.  Antibiotics: Zosyn 7/24 > 7/30 Vancomycin 7/24 > 7/30  DVT prophylaxis: Lovenox  Objective: Blood pressure 138/52, pulse 78, temperature 98.2 F (36.8 C), temperature source Oral, resp. rate 23, height  (1.626 m), weight 56.7 kg (125 lb), SpO2 100 %.  Intake/Output Summary (Last 24 hours) at 09/11/14 0926 Last data filed at 09/11/14 0926  Gross per 24 hour  Intake    789 ml  Output   2150 ml  Net  -1361 ml   Exam: General: No acute respiratory distress but requiring high level of O2 support  Lungs: poor air movement in B bases - no wheeze  Cardiovascular: Regular rate and rhythm - 2/6 holosystolic M - no rub appreciable Abdomen: Nontender, nondistended, soft, bowel sounds positive, no rebound, no ascites, no appreciable mass Extremities: No significant cyanosis, clubbing, or edema bilateral lower extremities  Data Reviewed: Basic Metabolic Panel:  Recent Labs Lab 09/07/14 0430 09/08/14 0517 09/09/14 0524 09/09/14 1400 09/10/14 0454 09/11/14 0440  NA 142 138  --  134* 138 137  K 3.8 3.3*  --  2.7* 3.6 3.7  CL 96* 92*  --  86* 85* 83*  CO2 36* 39*  --  38* 41* 46*  GLUCOSE 177* 105*  --  324* 72 53*  BUN 31* 30*  --  36* 44* 46*  CREATININE 1.24* 1.33*  --  1.54* 1.40* 1.37*  CALCIUM 9.0 9.3  --  9.1 9.6 9.7  MG 2.1 1.9 2.2  --  2.1 2.1   Liver Function Tests:  Recent Labs Lab 09/05/14 0612 09/06/14 0340 09/07/14 0430 09/08/14 0517  AST 11* 14* 15 15  ALT 9* 13* 14 15  ALKPHOS 42 54 61 59  BILITOT 1.3* 0.8 1.0 0.8  PROT 4.0* 5.3* 5.3* 5.5*  ALBUMIN 1.9* 2.5* 2.4* 2.5*   CBC:  Recent Labs Lab 09/07/14 0430 09/08/14 0517 09/08/14 2337 09/09/14 0524 09/10/14 0454 09/11/14 0440  WBC 10.4 10.3  --   9.1 10.7* 12.1*  NEUTROABS 8.4* 9.1*  --  7.4 8.4* 8.4*  HGB 10.6* 7.9* 11.5* 11.8* 12.2 12.2  HCT 32.9* 24.4* 34.9* 36.1 36.9 37.2  MCV 101.9* 98.8  --  95.3 96.3 97.1  PLT 204 272  --  226 268 269   CBG:  Recent Labs Lab 09/10/14 1956 09/10/14 2348 09/11/14 0500 09/11/14 0522 09/11/14 0747  GLUCAP 221* 410* 43* 112* 86    Recent Results (from the past 240 hour(s))  MRSA PCR Screening     Status: None   Collection Time: 09/03/14  8:41 AM  Result Value Ref Range Status   MRSA by PCR NEGATIVE NEGATIVE Final    Comment:        The GeneXpert MRSA Assay (FDA approved for NASAL specimens only), is one component of a comprehensive MRSA colonization surveillance program. It is not intended to diagnose MRSA infection nor to guide or monitor treatment for MRSA infections.   Culture, blood (routine x 2)     Status: None   Collection Time: 09/03/14  9:56 AM  Result Value Ref Range Status   Specimen Description  BLOOD LEFT ARM  Final   Special Requests IN PEDIATRIC BOTTLE 1CC  Final   Culture NO GROWTH 5 DAYS  Final   Report Status 09/08/2014 FINAL  Final  Culture, blood (routine x 2)     Status: None   Collection Time: 09/03/14 11:10 AM  Result Value Ref Range Status   Specimen Description BLOOD RIGHT ARM  Final   Special Requests BOTTLES DRAWN AEROBIC ONLY 7CC  Final   Culture NO GROWTH 5 DAYS  Final   Report Status 09/08/2014 FINAL  Final  Culture, Urine     Status: None   Collection Time: 09/03/14 11:44 AM  Result Value Ref Range Status   Specimen Description URINE, RANDOM  Final   Special Requests NONE  Final   Culture NO GROWTH 1 DAY  Final   Report Status 09/05/2014 FINAL  Final     Studies:  Recent x-ray studies have been reviewed in detail by the Attending Physician  Scheduled Meds:  Scheduled Meds: . sodium chloride   Intravenous Once  . sodium chloride   Intravenous Once  . amLODipine  10 mg Oral Daily  . atorvastatin  20 mg Oral Daily  .  dextromethorphan-guaiFENesin  1 tablet Oral BID  . docusate sodium  100 mg Oral BID  . DULoxetine  60 mg Oral Daily  . feeding supplement (ENSURE ENLIVE)  237 mL Oral BID BM  . furosemide  60 mg Intravenous TID  . hydrALAZINE  100 mg Oral 3 times per day  . insulin aspart  0-20 Units Subcutaneous 6 times per day  . insulin glargine  15 Units Subcutaneous Daily  . ipratropium-albuterol  3 mL Nebulization TID  . LORazepam  0.5-2 mg Intravenous Once  . methylPREDNISolone (SOLU-MEDROL) injection  15.2 mg Intravenous Q24H  . sodium chloride  10-40 mL Intracatheter Q12H    Time spent on care of this patient: 35 mins  Lonia Blood, MD [facility] For Consults/Admissions - Flow Manager - [phone] Office  [phone]  Contact MD directly via text page:      [email or web address]      password [facility]  09/11/2014, 9:26 AM   LOS: 8 days

## 2014-09-11 NOTE — Progress Notes (Signed)
OT Cancellation Note  Patient Details Name: IVETH HEIDEMANN MRN: [id number] DOB: 1930-11-27   Cancelled Treatment:    Reason Eval/Treat Not Completed: OT screened, no needs identified, will sign off.  Pt is performing at her baseline in ADL.  Plans to return to SNF upon discharge.  No acute OT needs.   Evern Bio 09/11/2014, 8:00 AM  [phone]

## 2014-09-11 NOTE — Progress Notes (Signed)
Placed a call to 6 E to give report to them on Room 2C18. Per Mardelle Matte, will call back for report. He states that they are going to bring a patient down here and then get report afterwards.

## 2014-09-11 NOTE — Progress Notes (Signed)
09/11/2014 1:58 PM  Report received on patient. Report was given on 2C at nurses station. Room is ready for patient.   [employer], RN-BC, [employer] [facility] Phone [phone]

## 2014-09-11 NOTE — Progress Notes (Signed)
Inpatient Diabetes Program Recommendations  AACE/ADA: New Consensus Statement on Inpatient Glycemic Control (2013)  Target Ranges:  Prepandial:   less than 140 mg/dL      Peak postprandial:   less than 180 mg/dL (1-2 hours)      Critically ill patients:  140 - 180 mg/dL   Results for AMARII, AMY (MRN [id number]) as of 09/11/2014 09:38  Ref. Range 09/10/2014 05:42 09/10/2014 11:57 09/10/2014 16:08 09/10/2014 19:56 09/10/2014 23:48 09/11/2014 05:00 09/11/2014 05:22 09/11/2014 07:47  Glucose-Capillary Latest Ref Range: 65-99 mg/dL 86 782 (H) 956 (H) 213 (H) 410 (H) 43 (LL) 112 (H) 86   Reason for Admission: HF  Diabetes history: DM 2 Outpatient Diabetes medications: Amaryl 2 mg Daily, Levemir 10 units QAM, 8 units QPM, Humalog 0-7 units TID Current orders for Inpatient glycemic control: Lantus 15 units Q24hrs, Novolog Resistant  Patient receiving IV Solumedrol 15.2 mg Q 24 hrs.  Inpatient Diabetes Program Recommendations Insulin - Basal: Patient takes basal insulin BID at home. Basal insulin really affects fasting glucose. Patient had hypoglycemia this am, consider reducing dose back down but ordering BID. Consider ordering home dose basal insulin 10 units QAM, 8 units QHS. Correction (SSI): Please add Novolog HS scale. Insulin - Meal Coverage: Due to hyperglycemia. Please add Novolog 7 units meal coverage in addition to Resistant Correction.  Thanks,  Christena Deem RN, MSN, [place] Inpatient Diabetes Coordinator Team Pager [phone]

## 2014-09-12 ENCOUNTER — Inpatient Hospital Stay (HOSPITAL_COMMUNITY): Payer: Medicare Other

## 2014-09-12 DIAGNOSIS — D6489 Other specified anemias: Secondary | ICD-10-CM

## 2014-09-12 LAB — COMPREHENSIVE METABOLIC PANEL
ALT: 16 U/L (ref 14–54)
ANION GAP: 8 (ref 5–15)
AST: 15 U/L (ref 15–41)
Albumin: 2.6 g/dL — ABNORMAL LOW (ref 3.5–5.0)
Alkaline Phosphatase: 61 U/L (ref 38–126)
BUN: 56 mg/dL — AB (ref 6–20)
CHLORIDE: 78 mmol/L — AB (ref 101–111)
CO2: 49 mmol/L — ABNORMAL HIGH (ref 22–32)
CREATININE: 1.49 mg/dL — AB (ref 0.44–1.00)
Calcium: 9.1 mg/dL (ref 8.9–10.3)
GFR calc non Af Amer: 32 mL/min — ABNORMAL LOW (ref >60)
GFR, EST AFRICAN AMERICAN: 37 mL/min — AB (ref >60)
Glucose, Bld: 146 mg/dL — ABNORMAL HIGH (ref 65–99)
Potassium: 3.8 mmol/L (ref 3.5–5.1)
Sodium: 135 mmol/L (ref 135–145)
Total Bilirubin: 0.6 mg/dL (ref 0.3–1.2)
Total Protein: 5.4 g/dL — ABNORMAL LOW (ref 6.5–8.1)

## 2014-09-12 LAB — CBC
HCT: 37.9 % (ref 36.0–46.0)
Hemoglobin: 12.2 g/dL (ref 12.0–15.0)
MCH: 31.5 pg (ref 26.0–34.0)
MCHC: 32.2 g/dL (ref 30.0–36.0)
MCV: 97.9 fL (ref 78.0–100.0)
Platelets: 254 10*3/uL (ref 150–400)
RBC: 3.87 MIL/uL (ref 3.87–5.11)
RDW: 15.3 % (ref 11.5–15.5)
WBC: 10.7 10*3/uL — ABNORMAL HIGH (ref 4.0–10.5)

## 2014-09-12 LAB — GLUCOSE, CAPILLARY
GLUCOSE-CAPILLARY: 133 mg/dL — AB (ref 65–99)
Glucose-Capillary: 156 mg/dL — ABNORMAL HIGH (ref 65–99)
Glucose-Capillary: 291 mg/dL — ABNORMAL HIGH (ref 65–99)

## 2014-09-12 MED ORDER — LORAZEPAM 0.5 MG PO TABS: 0.2500 mg | ORAL_TABLET | Freq: Every day | ORAL | Status: DC | PRN

## 2014-09-12 MED ORDER — INSULIN PEN NEEDLE 29G X 12MM MISC: 5.0000 [IU] | Freq: Every morning | Status: AC

## 2014-09-12 MED ORDER — INSULIN ASPART 100 UNIT/ML FLEXPEN: 5.0000 [IU] | PEN_INJECTOR | Freq: Three times a day (TID) | SUBCUTANEOUS | Status: AC

## 2014-09-12 MED ORDER — ENSURE ENLIVE PO LIQD: 237.0000 mL | Freq: Two times a day (BID) | ORAL | Status: AC

## 2014-09-12 MED ORDER — FUROSEMIDE 40 MG PO TABS: 60.0000 mg | ORAL_TABLET | Freq: Two times a day (BID) | ORAL | Status: DC

## 2014-09-12 MED ORDER — INSULIN DETEMIR 100 UNIT/ML FLEXPEN: PEN_INJECTOR | SUBCUTANEOUS | Status: DC

## 2014-09-12 MED ORDER — FUROSEMIDE 10 MG/ML IJ SOLN
60.0000 mg | Freq: Two times a day (BID) | INTRAMUSCULAR | Status: DC
  Administered 2014-09-12: 60 mg via INTRAVENOUS
  Filled 2014-09-12: qty 6

## 2014-09-12 NOTE — Clinical Social Work Note (Signed)
Patient medically stable for dischaarge back to [facility] today. Discharge information transmitted to facility and patient will be transported by ambulance ([facility]).  Daughter Steva Colder - [phone]) contacted and message left regarding d/c.  Genelle Bal, MSW, LCSW Licensed Clinical Social Worker Clinical Social Work Department [employer] [phone]

## 2014-09-12 NOTE — Progress Notes (Signed)
Per MD order, PICC line removed. Cath intact at 41cm. Vaseline pressure gauze to site, pressure held x . No bleeding to site. Pt instructed to keep dressing CDI x 24 hours. Avoid heavy lifting, pushing or pulling x 24 hours. Pt does not have any questions. Michelle Bowers

## 2014-09-12 NOTE — Discharge Summary (Signed)
Physician Discharge Summary  [place] Vayda RUE:[id number] DOB: Nov 26, 1930 DOA: 09/03/2014  PCP: Julian Hy, MD  Admit date: 09/03/2014 Discharge date: 09/12/2014  Time spent: 30 minutes  Recommendations for Outpatient Follow-up:  Acute diastolic CHF:  -7/24 PCXR; bibasilar opacification see results below. -Echocardiogram; consistent with diastolic CHF -Strict I&O since admission; - 4.2 L  -Daily weight 7/26 bed weight= 55.7 kg 8/2 discharge weight= 56.1 kg kg -Transfuse for hemoglobin<8 -Follow-up with PCP within one week of discharge  Pulmonary edema? -7/31 CVP=7 -Discharge on Lasix 60 mg BID -SNF and PCP to monitor patient's weight and electrolytes closely. Recommend qwk minimum -Follow-up with PCP within one week of discharge  Sepsis due to HCAP?/Aspiration pneumonia?  -Completed 7 day course antibiotics  Acute respiratory failure with Hypercapnia  -Likely multifactorial due to Pneumonia vs. Acute HF vs. COPD exacerbation; troponin 2 negative  -Continue BiPAP and oxygen therapy PRN  -Continue albuterol Q6H PRN  Anemia -Most likely multifactorial to include sepsis, dilutional, underproduction.  -7/26 Type and cross and transfuse 1 unit PRBC -7/29 transfuse 1 unit PRBC -7/31 resolved  Decreasing platelets (sepsis vs hit?) -Hit panel negative  Hypokalemia -Potassium goal> 4  Hypomagnesemia -Magnesium goal> 2  DM Type II uncontrolled -7/24 Hemoglobin A1c= 7.5  -Levemir 10 units qAm; 8 unitsQHS -NovoLog 5 units QAC -Follow-up with PCP within one week of discharge; titrate diabetic medication  HLD -Lipid panel; slightly outside ADA guidelines -Increase Lipitor to 20 mg daily  Acute on chronic kidney failure (Cr Baseline ~ 1.3).  -Due to HF vs. Sepsis -Cr slightly above baseline  Anxiety -continue Ativan 0.25 mg daily PRN  -Continue Cymbalta 60 mg daily  Altered mental status -Most likely multifactorial to include sepsis,  acute/subacute stroke, clearing  Acute stroke on chronic strokes -Patient's history of present illness is consistent with Subacute stroke, most likely occurring~3 weeks ago. -7/24 CT head shows probable acute stroke with multiple chronic stroke. See results below -Carotid Dopplers; see results below -MRI brain; shows multiple chronic strokes, but negative acute stroke.   Stenosis right external carotid artery -Most likely not a surgical candidate. Spoke with Dr. Imogene Burn (vascular surgery) no treatment indicated  Tobacco abuse - Patient is a 1 PPD 50 years smoker (stopped July 2010).       Discharge Diagnoses:  Active Problems:   Essential hypertension   COPD (chronic obstructive pulmonary disease)   Pneumobilia   Respiratory failure   Obtunded   Sepsis due to pneumonia   Meconium aspiration pneumonia   Acute respiratory failure with hypercapnia   Hyperkalemia   Type 2 diabetes mellitus with complication   Acute on chronic renal failure   Acute embolic stroke   Chronic arterial ischemic stroke   Altered mental status   Pulmonary edema   HCAP (healthcare-associated pneumonia)   Hypokalemia   Diabetes type 2, uncontrolled   HLD (hyperlipidemia)   Anxiety state   Stroke   Acute pulmonary edema   Aspiration pneumonia due to food (regurgitated)   Acute diastolic CHF (congestive heart failure)   Hypomagnesemia   Acute ischemic stroke   Tobacco abuse   External carotid artery stenosis   Absolute anemia   Discharge Condition: Stable  Diet recommendation: Heart healthy/diabetic  Filed Weights   09/10/14 0449 09/11/14 0500 09/11/14 2100  Weight: 56.6 kg (124 lb 12.5 oz) 56.7 kg (125 lb) 56.1 kg (123 lb 10.9 oz)    History of present illness:  81 y.o. WF PMHx Depression, CVA, HTN, CHF DM type II,  COPD (chronic O2 4 L O2 via [place] during the day/BiPAP at night), presenting with cc of dyspnea. Patient is currently on Bipap and can't provide history. No family at bedside ;  she is a resident at [facility]. History taking from ER physician. She reported orthopnea. She had a pneumonia three weeks ago and she reported that her symptoms now are similar to her symptoms from 3 weeks ago. No chest pain During his hospitalization patient was treated for sepsis due to HCAP, acute diastolic CHF, pulmonary edema, acute respiratory failure with hypercapnia. Patient completed appropriate course of antibiotics and was started on medication appropriate for diastolic CHF.    Consultants: Dr. Luanna Cole (PCCM) Dr. Imogene Burn (vascular surgery)    Procedure/Significant Events: 7/24 PCXR; poor respiratory effort resulted in hyperexpansion, bibasilar airspace opacification to my: Pneumonia? 7/24 CT head without contrast; Atrophy with multiple areas of prior infarct.  -Suspect recent infarct Rt cerebellum slightly inferior to the right dentate nucleus.  -Age uncertain infarct Rt thalamus. -Chronic infarct Lt thalamus as well as in the tail of the caudate nucleus 7/25 renal ultrasound;-Rt kidney atrophic w/ thinning and increased echogenicity renal parenchyma, suggestive of chronic renal Dz 7/25 echocardiogram;- Left ventricle: severe LVH. -LVEF= 60%- 65%.  -Dynamic obstruction. Dynamic Obstruction, with a peak gradient of 78 mm Hg.-Relaxation (grade 1 diastolic dysfunction). 7/26 carotid Dopplers; Rt - 1-39% ICA stenosis.Rt ECA stenosis. Left - 40% to 59% ICA stenosis. Vertebral artery flow is antegrade. 7/29 MRA/MRI brain; -lacunar infarcts of the thalami are remote bilaterally.  - remote posterior cerebellar infarct - Extensive periventricular/subcortical confluent white matter changes present bilaterally with extension i/t brainstem.   Culture 7/24 MRSA by PCR negative 7/24 blood left/right arm NGTD 7/24 urine negative 7/24 strep pneumo/Legionella urine antigen negative   Antibiotics: Zosyn 7/24>> stopped 7/30 Vancomycin 7/24>> stopped 7/30   Discharge Exam: Filed Vitals:    09/11/14 1502 09/11/14 1930 09/11/14 2100 09/12/14 0500  BP: 151/62  139/63 155/82  Pulse: 112 92 97 81  Temp: 98.4 F (36.9 C)  97.6 F (36.4 C) 98 F (36.7 C)  TempSrc: Oral  Oral Oral  Resp: 18 18 18 18   Height:      Weight:   56.1 kg (123 lb 10.9 oz)   SpO2: 98%  95% 100%    General: A/O 4, sitting comfortably in bed, on 6 L O2 via [place], Eyes: Pupils equal round reactive to light and accommodation  ENT: negative gingival bleeding Neck: Negative scars, masses, torticollis, lymphadenopathy, JVD Lungs: left lung fields clear to auscultation, right upper/middle lobe clear to auscultation, RLL breath sounds present but decreased. Negative wheezing, negative crackles.  Cardiovascular: Regular rate and rhythm without murmur gallop or rub normal S1 and S2 Abdomen: Soft, nontender, plus bowel sounds  Extremities: No significant cyanosis, clubbing, or edema bilateral lower extremities    Discharge Instructions     Medication List    ASK your doctor about these medications        albuterol 108 (90 BASE) MCG/ACT inhaler  Commonly known as:  PROVENTIL HFA;VENTOLIN HFA  Inhale 2 puffs into the lungs every 6 (six) hours as needed for shortness of breath.     ALPRAZolam 0.25 MG tablet  Commonly known as:  XANAX  Take 0.25 mg by mouth 2 (two) times daily as needed for anxiety.     amLODipine 10 MG tablet  Commonly known as:  NORVASC  Take 10 mg by mouth daily.     atorvastatin 10 MG tablet  Commonly known as:  LIPITOR  Take 10 mg by mouth daily.     CHLORASEPTIC 1.4 % Liqd  Generic drug:  phenol  Use as directed 1 spray in the mouth or throat every 2 (two) hours as needed for throat irritation / pain.     Cholecalciferol 1000 UNITS capsule  Take 1,000 Units by mouth 2 (two) times daily.     docusate sodium 100 MG capsule  Commonly known as:  COLACE  Take 100 mg by mouth 2 (two) times daily.     DULoxetine 60 MG capsule  Commonly known as:  CYMBALTA  Take 60 mg  by mouth daily.     ferrous sulfate 325 (65 FE) MG tablet  Take 325 mg by mouth 3 (three) times daily.     Fish Oil 1000 MG Caps  Take 1 capsule by mouth 2 (two) times daily.     fluticasone 50 MCG/ACT nasal spray  Commonly known as:  FLONASE  Place 2 sprays into both nostrils 2 (two) times daily.     furosemide 20 MG tablet  Commonly known as:  LASIX  Take 20-40 mg by mouth 2 (two) times daily. Take 40mg  in the morning and 20mg  midday.     glimepiride 2 MG tablet  Commonly known as:  AMARYL  Take 2 mg by mouth daily with breakfast.     guaifenesin 100 MG/5ML syrup  Commonly known as:  ROBITUSSIN  Take 300 mg by mouth every 4 (four) hours as needed for cough.     hydrALAZINE 100 MG tablet  Commonly known as:  APRESOLINE  Take 100 mg by mouth 3 (three) times daily.     insulin detemir 100 UNIT/ML injection  Commonly known as:  LEVEMIR  Inject 8-10 Units into the skin See admin instructions. Take 8 units at bedtime. Also take 10 units in the AM     insulin lispro 100 UNIT/ML injection  Commonly known as:  HUMALOG  Inject 0-7 Units into the skin daily as needed for high blood sugar. 120=0 units 120-150=3 units 151-175=5 units >176=7 units     ipratropium-albuterol 0.5-2.5 (3) MG/3ML Soln  Commonly known as:  DUONEB  Take 3 mLs by nebulization every 6 (six) hours as needed. For shortness of breath     lansoprazole 30 MG capsule  Commonly known as:  PREVACID  Take 30 mg by mouth daily at 12 noon.     loratadine 10 MG tablet  Commonly known as:  CLARITIN  Take 10 mg by mouth daily as needed for allergies.     mesalamine 1.2 G EC tablet  Commonly known as:  LIALDA  Take 2.4 g by mouth every evening.     potassium chloride 10 MEQ CR capsule  Commonly known as:  MICRO-K  Take 20 mEq by mouth daily.     sennosides-docusate sodium 8.6-50 MG tablet  Commonly known as:  SENOKOT-S  Take 1 tablet by mouth 2 (two) times daily.     SYSTANE BALANCE 0.6 % Soln  Generic drug:   Propylene Glycol  Place 1 drop into both eyes 2 (two) times daily.     traMADol 50 MG tablet  Commonly known as:  ULTRAM  Take 50 mg by mouth 2 (two) times daily as needed for moderate pain.     vitamin B-12 500 MCG tablet  Commonly known as:  CYANOCOBALAMIN  Take 500 mcg by mouth daily.       Allergies  Allergen Reactions  .  Codeine     unknown  . Doxazosin Mesylate     unknown  . Gemfibrozil     unknown  . Rosiglitazone Maleate     REACTION: sensitivity       Follow-up Information    Follow up with Julian Hy, MD. Schedule an appointment as soon as possible for a visit in 1 week.   Specialty:  Endocrinology   Why:  Follow-up acute on chronic respiratory failure, acute diastolic CHF, acute on chronic renal failure   Contact information:   [address] [place] [address] [phone]        The results of significant diagnostics from this hospitalization (including imaging, microbiology, ancillary and laboratory) are listed below for reference.    Significant Diagnostic Studies: Ct Head Wo Contrast  09/03/2014   CLINICAL DATA:  Altered mental status  EXAM: CT HEAD WITHOUT CONTRAST  TECHNIQUE: Contiguous axial images were obtained from the base of the skull through the vertex without intravenous contrast.  COMPARISON:  Head CT August 24, 2008; brain MRI August 24, 2008  FINDINGS: Moderate diffuse atrophy is stable. There is no intracranial mass, hemorrhage, extra-axial fluid collection, or midline shift. There is small vessel disease throughout the centra semiovale bilaterally, most pronounced superiorly and posteriorly on the left. There is evidence of a prior infarct involving the tail of the caudate nucleus on the left as well as a portion of the left thalamus. There is decreased attenuation in the right thalamus which was not present previously and is consistent with an age uncertain infarct. There is also decreased attenuation in the inferior cerebellum on  the right slightly inferior to the right dentate nucleus. This area has an appearance concerning for recent infarct. Bony calvarium appears intact. Mastoid air cells are clear.  IMPRESSION: Atrophy with multiple areas of prior infarct. Suspect recent infarct in the right cerebellum slightly inferior to the right dentate nucleus. Age uncertain infarct in the right thalamus. Chronic infarct left thalamus as well as in the tail of the caudate nucleus on the left. Extensive periventricular small vessel disease present. No hemorrhage or mass effect.   Electronically Signed   By: Bretta Bang III M.D.   On: 09/03/2014 16:08   Mr Maxine Glenn Head Wo Contrast  09/08/2014   CLINICAL DATA:  Respiratory distress. The patient has recently been obtunded. Abnormal CT scan.  The examination had to be discontinued prior to completion due to difficulty breathing while lying flat.  EXAM: MRI HEAD WITHOUT CONTRAST  MRA HEAD WITHOUT CONTRAST  TECHNIQUE: Multiplanar, multiecho pulse sequences of the brain and surrounding structures were obtained without intravenous contrast. Angiographic images of the head were obtained using MRA technique without contrast.  COMPARISON:  CT head without contrast 09/03/2014.  FINDINGS: MRI HEAD FINDINGS  The diffusion-weighted images demonstrate no evidence for acute or subacute infarction. Atrophy and extensive white matter disease is present bilaterally. The lacunar infarcts of the thalami are remote bilaterally. A remote posterior cerebellar infarct is present as well. Extensive periventricular and subcortical confluent white matter changes are present bilaterally with extension into the brainstem.  MRA HEAD FINDINGS  The time-of-flight MRA images are significantly degraded by patient motion.  Atherosclerotic changes are noted in the cavernous internal carotid arteries without definite stenosis. Mild narrowing is present in the distal left M1 segment. A1 and M1 segments otherwise within normal limits.  Attenuation of MCA branch vessels is exaggerated by patient motion.  The right vertebral artery is the dominant vessel. The left vertebral  artery terminates at the PICA. Irregularity is noted within the dominant right vertebral artery and basilar artery without significant stenosis. There is moderate stenosis at the left P1 segment of the posterior cerebral artery. A prominent left posterior communicating artery is present.  IMPRESSION: 1. No acute infarct. 2. Lacunar infarcts of the thalami and posterior right cerebellum are remote. 3. Atrophy and extensive white matter disease. This likely reflects the sequela of chronic microvascular ischemia. 4. The MRA demonstrates moderate small vessel disease. This is exaggerated by significant patient motion on the time-of-flight images.   Electronically Signed   By: Marin Roberts M.D.   On: 09/08/2014 16:56   Mr Brain Wo Contrast  09/08/2014   CLINICAL DATA:  Respiratory distress. The patient has recently been obtunded. Abnormal CT scan.  The examination had to be discontinued prior to completion due to difficulty breathing while lying flat.  EXAM: MRI HEAD WITHOUT CONTRAST  MRA HEAD WITHOUT CONTRAST  TECHNIQUE: Multiplanar, multiecho pulse sequences of the brain and surrounding structures were obtained without intravenous contrast. Angiographic images of the head were obtained using MRA technique without contrast.  COMPARISON:  CT head without contrast 09/03/2014.  FINDINGS: MRI HEAD FINDINGS  The diffusion-weighted images demonstrate no evidence for acute or subacute infarction. Atrophy and extensive white matter disease is present bilaterally. The lacunar infarcts of the thalami are remote bilaterally. A remote posterior cerebellar infarct is present as well. Extensive periventricular and subcortical confluent white matter changes are present bilaterally with extension into the brainstem.  MRA HEAD FINDINGS  The time-of-flight MRA images are significantly  degraded by patient motion.  Atherosclerotic changes are noted in the cavernous internal carotid arteries without definite stenosis. Mild narrowing is present in the distal left M1 segment. A1 and M1 segments otherwise within normal limits. Attenuation of MCA branch vessels is exaggerated by patient motion.  The right vertebral artery is the dominant vessel. The left vertebral artery terminates at the PICA. Irregularity is noted within the dominant right vertebral artery and basilar artery without significant stenosis. There is moderate stenosis at the left P1 segment of the posterior cerebral artery. A prominent left posterior communicating artery is present.  IMPRESSION: 1. No acute infarct. 2. Lacunar infarcts of the thalami and posterior right cerebellum are remote. 3. Atrophy and extensive white matter disease. This likely reflects the sequela of chronic microvascular ischemia. 4. The MRA demonstrates moderate small vessel disease. This is exaggerated by significant patient motion on the time-of-flight images.   Electronically Signed   By: Marin Roberts M.D.   On: 09/08/2014 16:56   US Renal Port  09/04/2014   CLINICAL DATA:  Oliguria.  EXAM: RENAL / URINARY TRACT ULTRASOUND COMPLETE  COMPARISON:  None.  FINDINGS: Right Kidney:  Length: 8.6 cm. There is thinning of the renal parenchyma and increased renal echogenicity. Technically limited evaluation of the right kidney limiting detailed evaluation.  Left Kidney:  Length: 9.3 cm. Echogenicity within normal limits. No mass or hydronephrosis visualized.  Bladder:  Decompressed by Foley catheter not evaluated.  IMPRESSION: 1. Right kidney appears atrophic with thinning and increased echogenicity of the renal parenchyma, suggestive of chronic renal disease. Cortical thickness of the left kidney is preserved. 2. No hydronephrosis. 3. Urinary bladder decompressed by Foley catheter.   Electronically Signed   By: Rubye Oaks M.D.   On: 09/04/2014 02:43    Dg Chest Port 1 View  09/12/2014   CLINICAL DATA:  Pleural effusion  EXAM: PORTABLE CHEST - 1 VIEW  COMPARISON:  Chest x-ray from 2 days prior  FINDINGS: Left upper extremity PICC, tip at the upper right atrium, stable from previous.  Lung volumes remain low.  Unchanged cardiomegaly with bulky mitral annular ossification. As before, the trachea is deviated to the right, likely from thyroid nodule based on 2008 chest CT.  Improving lower lung aeration with the diaphragm better seen. No drainable pleural effusion. No pulmonary edema.  Calcified breast implants. Prominent stool again seen in the right upper quadrant.  IMPRESSION: Persistent hypoventilation but clearing lower lobe opacities.   Electronically Signed   By: Marnee Spring M.D.   On: 09/12/2014 07:17   Dg Chest Port 1 View  09/10/2014   CLINICAL DATA:  Pneumonia follow-up  EXAM: PORTABLE CHEST - 1 VIEW  COMPARISON:  09/04/2014 and prior radiographs  FINDINGS: Low volume film again noted.  Bilateral lower lung atelectasis/ opacities/effusions and pulmonary vascular congestion again noted.  A left PICC line is present with tip overlying the superior cavoatrial junction.  There is no evidence of pneumothorax.  IMPRESSION: Little significant change with bilateral lower lung opacities/ atelectasis/effusions and pulmonary vascular congestion.   Electronically Signed   By: Harmon Pier M.D.   On: 09/10/2014 07:59   Dg Chest Port 1 View  09/04/2014   CLINICAL DATA:  Pulmonary edema  EXAM: PORTABLE CHEST - 1 VIEW  COMPARISON:  09/03/2014  FINDINGS: Moderate pleural effusion bilaterally unchanged with compressive atelectasis in both lung bases. Mild vascular congestion  Left arm PICC tip has been placed with the tip in the right atrium. Recommend withdrawal of 4 cm.  IMPRESSION: PICC tip in the right atrium, recommend withdrawal 4 cm  Bibasilar atelectasis and effusion unchanged.   Electronically Signed   By: Marlan Palau M.D.   On: 09/04/2014 13:50    Dg Chest Port 1 View  09/03/2014   CLINICAL DATA:  Acute onset of shortness of breath. Initial encounter.  EXAM: PORTABLE CHEST - 1 VIEW  COMPARISON:  Chest radiograph performed 08/07/2014  FINDINGS: The lungs are hypoexpanded. Bibasilar airspace opacity could reflect pneumonia. No pleural effusion or pneumothorax is seen.  The cardiomediastinal silhouette is mildly enlarged. No acute osseous abnormalities are identified.  IMPRESSION: Lungs hypoexpanded. Bibasilar airspace opacities could reflect pneumonia. Mild cardiomegaly.   Electronically Signed   By: Roanna Raider M.D.   On: 09/03/2014 04:33    Microbiology: Recent Results (from the past 240 hour(s))  MRSA PCR Screening     Status: None   Collection Time: 09/03/14  8:41 AM  Result Value Ref Range Status   MRSA by PCR NEGATIVE NEGATIVE Final    Comment:        The GeneXpert MRSA Assay (FDA approved for NASAL specimens only), is one component of a comprehensive MRSA colonization surveillance program. It is not intended to diagnose MRSA infection nor to guide or monitor treatment for MRSA infections.   Culture, blood (routine x 2)     Status: None   Collection Time: 09/03/14  9:56 AM  Result Value Ref Range Status   Specimen Description BLOOD LEFT ARM  Final   Special Requests IN PEDIATRIC BOTTLE 1CC  Final   Culture NO GROWTH 5 DAYS  Final   Report Status 09/08/2014 FINAL  Final  Culture, blood (routine x 2)     Status: None   Collection Time: 09/03/14 11:10 AM  Result Value Ref Range Status   Specimen Description BLOOD RIGHT ARM  Final   Special Requests BOTTLES DRAWN AEROBIC  ONLY [facility]  Final   Culture NO GROWTH 5 DAYS  Final   Report Status 09/08/2014 FINAL  Final  Culture, Urine     Status: None   Collection Time: 09/03/14 11:44 AM  Result Value Ref Range Status   Specimen Description URINE, RANDOM  Final   Special Requests NONE  Final   Culture NO GROWTH 1 DAY  Final   Report Status 09/05/2014 FINAL  Final      Labs: Basic Metabolic Panel:  Recent Labs Lab 09/07/14 0430 09/08/14 0517 09/09/14 0524 09/09/14 1400 09/10/14 0454 09/11/14 0440 09/12/14 0519  NA 142 138  --  134* 138 137 135  K 3.8 3.3*  --  2.7* 3.6 3.7 3.8  CL 96* 92*  --  86* 85* 83* 78*  CO2 36* 39*  --  38* 41* 46* 49*  GLUCOSE 177* 105*  --  324* 72 53* 146*  BUN 31* 30*  --  36* 44* 46* 56*  CREATININE 1.24* 1.33*  --  1.54* 1.40* 1.37* 1.49*  CALCIUM 9.0 9.3  --  9.1 9.6 9.7 9.1  MG 2.1 1.9 2.2  --  2.1 2.1  --    Liver Function Tests:  Recent Labs Lab 09/06/14 0340 09/07/14 0430 09/08/14 0517 09/12/14 0519  AST 14* 15 15 15   ALT 13* 14 15 16   ALKPHOS 54 61 59 61  BILITOT 0.8 1.0 0.8 0.6  PROT 5.3* 5.3* 5.5* 5.4*  ALBUMIN 2.5* 2.4* 2.5* 2.6*   No results for input(s): LIPASE, AMYLASE in the last 168 hours. No results for input(s): AMMONIA in the last 168 hours. CBC:  Recent Labs Lab 09/07/14 0430 09/08/14 0517 09/08/14 2337 09/09/14 0524 09/10/14 0454 09/11/14 0440 09/12/14 0519  WBC 10.4 10.3  --  9.1 10.7* 12.1* 10.7*  NEUTROABS 8.4* 9.1*  --  7.4 8.4* 8.4*  --   HGB 10.6* 7.9* 11.5* 11.8* 12.2 12.2 12.2  HCT 32.9* 24.4* 34.9* 36.1 36.9 37.2 37.9  MCV 101.9* 98.8  --  95.3 96.3 97.1 97.9  PLT 204 272  --  226 268 269 254   Cardiac Enzymes: No results for input(s): CKTOTAL, CKMB, CKMBINDEX, TROPONINI in the last 168 hours. BNP: BNP (last 3 results)  Recent Labs  09/03/14 0309  BNP 377.8*    ProBNP (last 3 results) No results for input(s): PROBNP in the last 8760 hours.  CBG:  Recent Labs Lab 09/11/14 1614 09/11/14 1749 09/11/14 2128 09/12/14 0618 09/12/14 0738  GLUCAP 135* 154* 89 133* 156*       Signed:  Carolyne Littles, MD [facility] [phone] pager

## 2014-10-02 ENCOUNTER — Emergency Department (HOSPITAL_COMMUNITY): Payer: Medicare Other

## 2014-10-02 ENCOUNTER — Encounter (HOSPITAL_COMMUNITY): Payer: Self-pay | Admitting: Emergency Medicine

## 2014-10-02 ENCOUNTER — Inpatient Hospital Stay (HOSPITAL_COMMUNITY)
Admission: EM | Admit: 2014-10-02 | Discharge: 2014-10-09 | DRG: 291 | Disposition: A | Discharge status: Skilled Nursing Facility | Payer: Medicare Other | Attending: Internal Medicine | Admitting: Internal Medicine

## 2014-10-02 DIAGNOSIS — E1122 Type 2 diabetes mellitus with diabetic chronic kidney disease: Secondary | ICD-10-CM | POA: Diagnosis present

## 2014-10-02 DIAGNOSIS — I5032 Chronic diastolic (congestive) heart failure: Secondary | ICD-10-CM | POA: Diagnosis not present

## 2014-10-02 DIAGNOSIS — Z888 Allergy status to other drugs, medicaments and biological substances status: Secondary | ICD-10-CM | POA: Diagnosis not present

## 2014-10-02 DIAGNOSIS — E875 Hyperkalemia: Secondary | ICD-10-CM | POA: Diagnosis present

## 2014-10-02 DIAGNOSIS — Z79891 Long term (current) use of opiate analgesic: Secondary | ICD-10-CM

## 2014-10-02 DIAGNOSIS — J984 Other disorders of lung: Secondary | ICD-10-CM | POA: Diagnosis present

## 2014-10-02 DIAGNOSIS — Z87891 Personal history of nicotine dependence: Secondary | ICD-10-CM | POA: Diagnosis not present

## 2014-10-02 DIAGNOSIS — I4891 Unspecified atrial fibrillation: Secondary | ICD-10-CM | POA: Diagnosis present

## 2014-10-02 DIAGNOSIS — E780 Pure hypercholesterolemia: Secondary | ICD-10-CM | POA: Diagnosis present

## 2014-10-02 DIAGNOSIS — Z8673 Personal history of transient ischemic attack (TIA), and cerebral infarction without residual deficits: Secondary | ICD-10-CM | POA: Diagnosis not present

## 2014-10-02 DIAGNOSIS — F329 Major depressive disorder, single episode, unspecified: Secondary | ICD-10-CM | POA: Diagnosis present

## 2014-10-02 DIAGNOSIS — J9601 Acute respiratory failure with hypoxia: Secondary | ICD-10-CM

## 2014-10-02 DIAGNOSIS — E785 Hyperlipidemia, unspecified: Secondary | ICD-10-CM | POA: Diagnosis present

## 2014-10-02 DIAGNOSIS — I5033 Acute on chronic diastolic (congestive) heart failure: Secondary | ICD-10-CM | POA: Diagnosis present

## 2014-10-02 DIAGNOSIS — K219 Gastro-esophageal reflux disease without esophagitis: Secondary | ICD-10-CM | POA: Diagnosis present

## 2014-10-02 DIAGNOSIS — E11649 Type 2 diabetes mellitus with hypoglycemia without coma: Secondary | ICD-10-CM | POA: Diagnosis present

## 2014-10-02 DIAGNOSIS — J9621 Acute and chronic respiratory failure with hypoxia: Secondary | ICD-10-CM | POA: Diagnosis present

## 2014-10-02 DIAGNOSIS — M1990 Unspecified osteoarthritis, unspecified site: Secondary | ICD-10-CM | POA: Diagnosis present

## 2014-10-02 DIAGNOSIS — E876 Hypokalemia: Secondary | ICD-10-CM | POA: Diagnosis present

## 2014-10-02 DIAGNOSIS — Z79899 Other long term (current) drug therapy: Secondary | ICD-10-CM | POA: Diagnosis not present

## 2014-10-02 DIAGNOSIS — F411 Generalized anxiety disorder: Secondary | ICD-10-CM | POA: Diagnosis present

## 2014-10-02 DIAGNOSIS — I129 Hypertensive chronic kidney disease with stage 1 through stage 4 chronic kidney disease, or unspecified chronic kidney disease: Secondary | ICD-10-CM | POA: Diagnosis present

## 2014-10-02 DIAGNOSIS — J9622 Acute and chronic respiratory failure with hypercapnia: Secondary | ICD-10-CM | POA: Diagnosis present

## 2014-10-02 DIAGNOSIS — M810 Age-related osteoporosis without current pathological fracture: Secondary | ICD-10-CM | POA: Diagnosis present

## 2014-10-02 DIAGNOSIS — Z515 Encounter for palliative care: Secondary | ICD-10-CM | POA: Diagnosis not present

## 2014-10-02 DIAGNOSIS — H9190 Unspecified hearing loss, unspecified ear: Secondary | ICD-10-CM | POA: Diagnosis present

## 2014-10-02 DIAGNOSIS — J9602 Acute respiratory failure with hypercapnia: Secondary | ICD-10-CM | POA: Diagnosis not present

## 2014-10-02 DIAGNOSIS — N183 Chronic kidney disease, stage 3 unspecified: Secondary | ICD-10-CM | POA: Diagnosis present

## 2014-10-02 DIAGNOSIS — G8191 Hemiplegia, unspecified affecting right dominant side: Secondary | ICD-10-CM | POA: Diagnosis present

## 2014-10-02 DIAGNOSIS — J810 Acute pulmonary edema: Secondary | ICD-10-CM | POA: Diagnosis present

## 2014-10-02 DIAGNOSIS — I10 Essential (primary) hypertension: Secondary | ICD-10-CM | POA: Diagnosis present

## 2014-10-02 DIAGNOSIS — Z885 Allergy status to narcotic agent status: Secondary | ICD-10-CM | POA: Diagnosis not present

## 2014-10-02 DIAGNOSIS — I5031 Acute diastolic (congestive) heart failure: Secondary | ICD-10-CM | POA: Diagnosis present

## 2014-10-02 DIAGNOSIS — Z794 Long term (current) use of insulin: Secondary | ICD-10-CM

## 2014-10-02 DIAGNOSIS — J9611 Chronic respiratory failure with hypoxia: Secondary | ICD-10-CM | POA: Diagnosis present

## 2014-10-02 DIAGNOSIS — Q433 Congenital malformations of intestinal fixation: Secondary | ICD-10-CM | POA: Diagnosis not present

## 2014-10-02 DIAGNOSIS — E1165 Type 2 diabetes mellitus with hyperglycemia: Secondary | ICD-10-CM | POA: Diagnosis present

## 2014-10-02 DIAGNOSIS — J449 Chronic obstructive pulmonary disease, unspecified: Secondary | ICD-10-CM | POA: Diagnosis not present

## 2014-10-02 DIAGNOSIS — R0602 Shortness of breath: Secondary | ICD-10-CM

## 2014-10-02 DIAGNOSIS — IMO0002 Reserved for concepts with insufficient information to code with codable children: Secondary | ICD-10-CM | POA: Diagnosis present

## 2014-10-02 DIAGNOSIS — I6529 Occlusion and stenosis of unspecified carotid artery: Secondary | ICD-10-CM | POA: Diagnosis present

## 2014-10-02 DIAGNOSIS — J811 Chronic pulmonary edema: Secondary | ICD-10-CM

## 2014-10-02 LAB — BLOOD GAS, ARTERIAL
ACID-BASE EXCESS: 0.2 mmol/L (ref 0.0–2.0)
Bicarbonate: 26.6 mEq/L — ABNORMAL HIGH (ref 20.0–24.0)
Delivery systems: POSITIVE
Drawn by: 232811
EXPIRATORY PAP: 6
FIO2: 0.5
INSPIRATORY PAP: 12
O2 SAT: 96.9 %
PCO2 ART: 56.3 mmHg — AB (ref 35.0–45.0)
PO2 ART: 101 mmHg — AB (ref 80.0–100.0)
Patient temperature: 98.9
TCO2: 25.2 mmol/L (ref 0–100)
pH, Arterial: 7.298 — ABNORMAL LOW (ref 7.350–7.450)

## 2014-10-02 LAB — BLOOD GAS, VENOUS
ACID-BASE EXCESS: 1.9 mmol/L (ref 0.0–2.0)
Bicarbonate: 28.6 mEq/L — ABNORMAL HIGH (ref 20.0–24.0)
FIO2: 1
O2 SAT: 99.1 %
PATIENT TEMPERATURE: 98.6
TCO2: 26.6 mmol/L (ref 0–100)
pCO2, Ven: 58 mmHg — ABNORMAL HIGH (ref 45.0–50.0)
pH, Ven: 7.314 — ABNORMAL HIGH (ref 7.250–7.300)
pO2, Ven: 148 mmHg — ABNORMAL HIGH (ref 30.0–45.0)

## 2014-10-02 LAB — I-STAT CHEM 8, ED
BUN: 69 mg/dL — ABNORMAL HIGH (ref 6–20)
BUN: 51 mg/dL — ABNORMAL HIGH (ref 6–20)
CREATININE: 1.5 mg/dL — AB (ref 0.44–1.00)
Calcium, Ion: 1.31 mmol/L — ABNORMAL HIGH (ref 1.13–1.30)
Calcium, Ion: 1.43 mmol/L — ABNORMAL HIGH (ref 1.13–1.30)
Chloride: 100 mmol/L — ABNORMAL LOW (ref 101–111)
Chloride: 99 mmol/L — ABNORMAL LOW (ref 101–111)
Creatinine, Ser: 1.5 mg/dL — ABNORMAL HIGH (ref 0.44–1.00)
Glucose, Bld: 213 mg/dL — ABNORMAL HIGH (ref 65–99)
Glucose, Bld: 318 mg/dL — ABNORMAL HIGH (ref 65–99)
HEMATOCRIT: 33 % — AB (ref 36.0–46.0)
HEMATOCRIT: 31 % — AB (ref 36.0–46.0)
HEMOGLOBIN: 11.2 g/dL — AB (ref 12.0–15.0)
HEMOGLOBIN: 10.5 g/dL — AB (ref 12.0–15.0)
POTASSIUM: 7.6 mmol/L — AB (ref 3.5–5.1)
Potassium: 6.1 mmol/L (ref 3.5–5.1)
SODIUM: 133 mmol/L — AB (ref 135–145)
SODIUM: 134 mmol/L — AB (ref 135–145)
TCO2: 30 mmol/L (ref 0–100)
TCO2: 26 mmol/L (ref 0–100)

## 2014-10-02 LAB — I-STAT CG4 LACTIC ACID, ED: LACTIC ACID, VENOUS: 0.49 mmol/L — AB (ref 0.5–2.0)

## 2014-10-02 LAB — CBC
HEMATOCRIT: 36.7 % (ref 36.0–46.0)
HEMOGLOBIN: 11.5 g/dL — AB (ref 12.0–15.0)
MCH: 32.2 pg (ref 26.0–34.0)
MCHC: 31.3 g/dL (ref 30.0–36.0)
MCV: 102.8 fL — ABNORMAL HIGH (ref 78.0–100.0)
Platelets: 229 10*3/uL (ref 150–400)
RBC: 3.57 MIL/uL — AB (ref 3.87–5.11)
RDW: 17.1 % — ABNORMAL HIGH (ref 11.5–15.5)
WBC: 12.3 10*3/uL — AB (ref 4.0–10.5)

## 2014-10-02 LAB — BASIC METABOLIC PANEL
ANION GAP: 6 (ref 5–15)
BUN: 54 mg/dL — ABNORMAL HIGH (ref 6–20)
CO2: 30 mmol/L (ref 22–32)
Calcium: 9.8 mg/dL (ref 8.9–10.3)
Chloride: 101 mmol/L (ref 101–111)
Creatinine, Ser: 1.44 mg/dL — ABNORMAL HIGH (ref 0.44–1.00)
GFR, EST AFRICAN AMERICAN: 38 mL/min — AB (ref >60)
GFR, EST NON AFRICAN AMERICAN: 33 mL/min — AB (ref >60)
GLUCOSE: 198 mg/dL — AB (ref 65–99)
POTASSIUM: 7.2 mmol/L — AB (ref 3.5–5.1)
SODIUM: 137 mmol/L (ref 135–145)

## 2014-10-02 LAB — I-STAT TROPONIN, ED: Troponin i, poc: 0.04 ng/mL (ref 0.00–0.08)

## 2014-10-02 LAB — GLUCOSE, CAPILLARY: GLUCOSE-CAPILLARY: 189 mg/dL — AB (ref 65–99)

## 2014-10-02 LAB — BRAIN NATRIURETIC PEPTIDE: B Natriuretic Peptide: 479.9 pg/mL — ABNORMAL HIGH (ref 0.0–100.0)

## 2014-10-02 MED ORDER — DOCUSATE SODIUM 100 MG PO CAPS
100.0000 mg | ORAL_CAPSULE | Freq: Two times a day (BID) | ORAL | Status: DC
  Administered 2014-10-03 – 2014-10-09 (×12): 100 mg via ORAL
  Filled 2014-10-02 (×12): qty 1

## 2014-10-02 MED ORDER — INSULIN ASPART 100 UNIT/ML IV SOLN
10.0000 [IU] | Freq: Once | INTRAVENOUS | Status: AC
  Administered 2014-10-02: 10 [IU] via INTRAVENOUS
  Filled 2014-10-02: qty 0.1

## 2014-10-02 MED ORDER — HYDRALAZINE HCL 50 MG PO TABS
100.0000 mg | ORAL_TABLET | Freq: Three times a day (TID) | ORAL | Status: DC
Start: 2014-10-02 — End: 2014-10-09
  Administered 2014-10-03 – 2014-10-09 (×17): 100 mg via ORAL
  Filled 2014-10-02 (×18): qty 2

## 2014-10-02 MED ORDER — TRAMADOL HCL 50 MG PO TABS
50.0000 mg | ORAL_TABLET | Freq: Two times a day (BID) | ORAL | Status: DC | PRN
  Administered 2014-10-07: 50 mg via ORAL
  Filled 2014-10-02: qty 1

## 2014-10-02 MED ORDER — HEPARIN SODIUM (PORCINE) 5000 UNIT/ML IJ SOLN
5000.0000 [IU] | Freq: Three times a day (TID) | INTRAMUSCULAR | Status: DC
  Administered 2014-10-03 – 2014-10-09 (×20): 5000 [IU] via SUBCUTANEOUS
  Filled 2014-10-02 (×21): qty 1

## 2014-10-02 MED ORDER — VITAMIN D 1000 UNITS PO TABS
1000.0000 [IU] | ORAL_TABLET | Freq: Two times a day (BID) | ORAL | Status: DC
  Administered 2014-10-03 – 2014-10-09 (×12): 1000 [IU] via ORAL
  Filled 2014-10-02 (×12): qty 1

## 2014-10-02 MED ORDER — FERROUS SULFATE 325 (65 FE) MG PO TABS
325.0000 mg | ORAL_TABLET | Freq: Three times a day (TID) | ORAL | Status: DC
  Administered 2014-10-03 – 2014-10-09 (×17): 325 mg via ORAL
  Filled 2014-10-02 (×18): qty 1

## 2014-10-02 MED ORDER — INSULIN DETEMIR 100 UNIT/ML ~~LOC~~ SOLN
8.0000 [IU] | Freq: Every day | SUBCUTANEOUS | Status: DC
  Administered 2014-10-03 (×2): 8 [IU] via SUBCUTANEOUS
  Filled 2014-10-02 (×2): qty 0.08

## 2014-10-02 MED ORDER — LORATADINE 10 MG PO TABS
10.0000 mg | ORAL_TABLET | Freq: Every day | ORAL | Status: DC | PRN
  Administered 2014-10-05: 10 mg via ORAL
  Filled 2014-10-02 (×3): qty 1

## 2014-10-02 MED ORDER — SODIUM CHLORIDE 0.9 % IV SOLN
1.0000 g | Freq: Once | INTRAVENOUS | Status: AC
  Administered 2014-10-02: 1 g via INTRAVENOUS
  Filled 2014-10-02: qty 10

## 2014-10-02 MED ORDER — ATORVASTATIN CALCIUM 10 MG PO TABS
10.0000 mg | ORAL_TABLET | Freq: Every day | ORAL | Status: DC
  Administered 2014-10-03 – 2014-10-08 (×5): 10 mg via ORAL
  Filled 2014-10-02 (×6): qty 1

## 2014-10-02 MED ORDER — IPRATROPIUM-ALBUTEROL 0.5-2.5 (3) MG/3ML IN SOLN
3.0000 mL | Freq: Four times a day (QID) | RESPIRATORY_TRACT | Status: DC | PRN
  Filled 2014-10-02: qty 3

## 2014-10-02 MED ORDER — PANTOPRAZOLE SODIUM 40 MG PO TBEC
40.0000 mg | DELAYED_RELEASE_TABLET | Freq: Every day | ORAL | Status: DC
  Administered 2014-10-03 – 2014-10-09 (×7): 40 mg via ORAL
  Filled 2014-10-02 (×7): qty 1

## 2014-10-02 MED ORDER — AMLODIPINE BESYLATE 10 MG PO TABS
10.0000 mg | ORAL_TABLET | Freq: Every day | ORAL | Status: DC
  Administered 2014-10-03 – 2014-10-09 (×7): 10 mg via ORAL
  Filled 2014-10-02 (×7): qty 1

## 2014-10-02 MED ORDER — SODIUM CHLORIDE 0.9 % IJ SOLN: 3.0000 mL | INTRAMUSCULAR | Status: DC | PRN

## 2014-10-02 MED ORDER — GLIMEPIRIDE 2 MG PO TABS
2.0000 mg | ORAL_TABLET | Freq: Every day | ORAL | Status: DC
  Filled 2014-10-02 (×3): qty 1

## 2014-10-02 MED ORDER — FUROSEMIDE 10 MG/ML IJ SOLN
40.0000 mg | Freq: Once | INTRAMUSCULAR | Status: AC
  Administered 2014-10-02: 40 mg via INTRAVENOUS
  Filled 2014-10-02: qty 4

## 2014-10-02 MED ORDER — INSULIN ASPART 100 UNIT/ML ~~LOC~~ SOLN
0.0000 [IU] | SUBCUTANEOUS | Status: DC
  Administered 2014-10-03: 5 [IU] via SUBCUTANEOUS
  Administered 2014-10-03: 3 [IU] via SUBCUTANEOUS
  Administered 2014-10-03: 5 [IU] via SUBCUTANEOUS
  Administered 2014-10-03: 2 [IU] via SUBCUTANEOUS
  Administered 2014-10-03: 11 [IU] via SUBCUTANEOUS
  Administered 2014-10-04: 2 [IU] via SUBCUTANEOUS

## 2014-10-02 MED ORDER — SODIUM CHLORIDE 0.9 % IV SOLN
250.0000 mL | INTRAVENOUS | Status: DC | PRN
Start: 2014-10-02 — End: 2014-10-09

## 2014-10-02 MED ORDER — ASPIRIN EC 325 MG PO TBEC
325.0000 mg | DELAYED_RELEASE_TABLET | Freq: Every day | ORAL | Status: DC
  Administered 2014-10-03 – 2014-10-09 (×7): 325 mg via ORAL
  Filled 2014-10-02 (×7): qty 1

## 2014-10-02 MED ORDER — FLUTICASONE PROPIONATE 50 MCG/ACT NA SUSP
2.0000 | Freq: Two times a day (BID) | NASAL | Status: DC
  Administered 2014-10-03 – 2014-10-09 (×13): 2 via NASAL
  Filled 2014-10-02 (×2): qty 16

## 2014-10-02 MED ORDER — SODIUM CHLORIDE 0.9 % IJ SOLN
3.0000 mL | Freq: Two times a day (BID) | INTRAMUSCULAR | Status: DC
  Administered 2014-10-02 – 2014-10-09 (×13): 3 mL via INTRAVENOUS

## 2014-10-02 MED ORDER — POLYVINYL ALCOHOL 1.4 % OP SOLN
1.0000 [drp] | Freq: Two times a day (BID) | OPHTHALMIC | Status: DC
  Administered 2014-10-03 – 2014-10-09 (×14): 1 [drp] via OPHTHALMIC
  Filled 2014-10-02 (×2): qty 15

## 2014-10-02 MED ORDER — DEXTROSE 50 % IV SOLN
50.0000 mL | Freq: Once | INTRAVENOUS | Status: AC
  Administered 2014-10-02: 50 mL via INTRAVENOUS
  Filled 2014-10-02: qty 50

## 2014-10-02 MED ORDER — ALBUTEROL SULFATE HFA 108 (90 BASE) MCG/ACT IN AERS: 2.0000 | INHALATION_SPRAY | Freq: Four times a day (QID) | RESPIRATORY_TRACT | Status: DC | PRN

## 2014-10-02 MED ORDER — GUAIFENESIN 100 MG/5ML PO SOLN: 200.0000 mg | ORAL | Status: DC | PRN

## 2014-10-02 MED ORDER — PRO-STAT SUGAR FREE PO LIQD
30.0000 mL | Freq: Every day | ORAL | Status: DC
  Administered 2014-10-03 – 2014-10-09 (×5): 30 mL via ORAL
  Filled 2014-10-02 (×5): qty 30

## 2014-10-02 MED ORDER — ONDANSETRON HCL 4 MG/2ML IJ SOLN: 4.0000 mg | Freq: Four times a day (QID) | INTRAMUSCULAR | Status: DC | PRN

## 2014-10-02 MED ORDER — OMEGA-3-ACID ETHYL ESTERS 1 G PO CAPS
1.0000 g | ORAL_CAPSULE | Freq: Every day | ORAL | Status: DC
  Administered 2014-10-03 – 2014-10-09 (×7): 1 g via ORAL
  Filled 2014-10-02 (×7): qty 1

## 2014-10-02 MED ORDER — CYANOCOBALAMIN 500 MCG PO TABS
500.0000 ug | ORAL_TABLET | Freq: Every day | ORAL | Status: DC
  Administered 2014-10-03 – 2014-10-09 (×7): 500 ug via ORAL
  Filled 2014-10-02 (×9): qty 1

## 2014-10-02 MED ORDER — ALPRAZOLAM 0.25 MG PO TABS: 0.2500 mg | ORAL_TABLET | Freq: Two times a day (BID) | ORAL | Status: DC | PRN

## 2014-10-02 MED ORDER — DULOXETINE HCL 60 MG PO CPEP
60.0000 mg | ORAL_CAPSULE | Freq: Every day | ORAL | Status: DC
  Administered 2014-10-03 – 2014-10-09 (×7): 60 mg via ORAL
  Filled 2014-10-02 (×3): qty 2
  Filled 2014-10-02 (×2): qty 1
  Filled 2014-10-02 (×2): qty 2

## 2014-10-02 MED ORDER — FUROSEMIDE 10 MG/ML IJ SOLN: 40.0000 mg | Freq: Every day | INTRAMUSCULAR | Status: DC

## 2014-10-02 MED ORDER — ALBUTEROL SULFATE (2.5 MG/3ML) 0.083% IN NEBU
5.0000 mg | INHALATION_SOLUTION | Freq: Once | RESPIRATORY_TRACT | Status: AC
  Administered 2014-10-02: 5 mg via RESPIRATORY_TRACT
  Filled 2014-10-02: qty 6

## 2014-10-02 MED ORDER — SENNOSIDES-DOCUSATE SODIUM 8.6-50 MG PO TABS
1.0000 | ORAL_TABLET | Freq: Two times a day (BID) | ORAL | Status: DC
  Administered 2014-10-03 – 2014-10-09 (×12): 1 via ORAL
  Filled 2014-10-02 (×12): qty 1

## 2014-10-02 MED ORDER — ACETAMINOPHEN 325 MG PO TABS
650.0000 mg | ORAL_TABLET | ORAL | Status: DC | PRN
  Administered 2014-10-07: 650 mg via ORAL
  Filled 2014-10-02: qty 2

## 2014-10-02 NOTE — ED Notes (Signed)
Nurse in the room at this time starting IV.

## 2014-10-02 NOTE — ED Notes (Signed)
I gave I stat Chem 8 results to MD [facility]

## 2014-10-02 NOTE — ED Notes (Signed)
Notified edp of ISTAT critical lab results

## 2014-10-02 NOTE — ED Provider Notes (Signed)
CSN: [id number]     Arrival date & time 10/02/14  1826 History   First MD Initiated Contact with Patient 10/02/14 1926     Chief Complaint  Patient presents with  . Respiratory Distress     (Consider location/radiation/quality/duration/timing/severity/associated sxs/prior Treatment) HPI   Patient is a 81 year old female with end-stage COPD/CHF, diabetes, hypertension, depression. Multiple hospitalizations. Mostly has to place earlier this month. She presents today with shortness of breath. She is unable to give [place] a history due to respiratory distress. She says she is having increasing trouble breathing at home. Mild increasing cough no increase in swelling.  Past Medical History  Diagnosis Date  . Depression   . Hypercholesteremia   . Hypertension   . Diabetes mellitus without complication   . COPD (chronic obstructive pulmonary disease)   . Renal disorder   . Chronic kidney disease   . CHF (congestive heart failure)   . Stroke     right deficits   Past Surgical History  Procedure Laterality Date  . Abdominal hysterectomy      partial   History reviewed. No pertinent family history. Social History  Substance Use Topics  . Smoking status: Former Smoker    Quit date: 07/06/2008  . Smokeless tobacco: None  . Alcohol Use: No   OB History    No data available     Review of Systems  Unable to perform ROS: Acuity of condition  Respiratory: Positive for chest tightness and shortness of breath.   Genitourinary: Positive for dysuria.  Skin: Negative for rash.  Allergic/Immunologic: Negative for immunocompromised state.      Allergies  Codeine; Doxazosin mesylate; Gemfibrozil; and Rosiglitazone maleate  Home Medications   Prior to Admission medications   Medication Sig Start Date End Date Taking? Authorizing Provider  albuterol (PROVENTIL HFA;VENTOLIN HFA) 108 (90 BASE) MCG/ACT inhaler Inhale 2 puffs into the lungs every 6 (six) hours as needed for shortness of  breath.    Historical Provider, MD  amLODipine (NORVASC) 10 MG tablet Take 10 mg by mouth daily.    Historical Provider, MD  atorvastatin (LIPITOR) 10 MG tablet Take 10 mg by mouth daily.    Historical Provider, MD  Cholecalciferol 1000 UNITS capsule Take 1,000 Units by mouth 2 (two) times daily.    Historical Provider, MD  docusate sodium (COLACE) 100 MG capsule Take 100 mg by mouth 2 (two) times daily.    Historical Provider, MD  DULoxetine (CYMBALTA) 60 MG capsule Take 60 mg by mouth daily.    Historical Provider, MD  feeding supplement, ENSURE ENLIVE, (ENSURE ENLIVE) LIQD Take 237 mLs by mouth 2 (two) times daily between meals. 09/12/14   Drema Dallas, MD  ferrous sulfate 325 (65 FE) MG tablet Take 325 mg by mouth 3 (three) times daily.    Historical Provider, MD  fluticasone (FLONASE) 50 MCG/ACT nasal spray Place 2 sprays into both nostrils 2 (two) times daily.     Historical Provider, MD  furosemide (LASIX) 40 MG tablet Take 1.5 tablets (60 mg total) by mouth 2 (two) times daily. 09/12/14   Drema Dallas, MD  hydrALAZINE (APRESOLINE) 100 MG tablet Take 100 mg by mouth 3 (three) times daily.    Historical Provider, MD  insulin aspart (NOVOLOG FLEXPEN) 100 UNIT/ML FlexPen Inject 5 Units into the skin 3 (three) times daily with meals. 09/12/14   Drema Dallas, MD  Insulin Detemir (LEVEMIR) 100 UNIT/ML Pen Inject 10 units at 10 AM, then 8 units at bedtime  09/12/14   Drema Dallas, MD  Insulin Pen Needle (AURORA PEN NEEDLES) 29G X MISC 5 Units by Does not apply route every morning. 09/12/14   Drema Dallas, MD  ipratropium-albuterol (DUONEB) 0.5-2.5 (3) MG/3ML SOLN Take 3 mLs by nebulization every 6 (six) hours as needed. For shortness of breath    Historical Provider, MD  lansoprazole (PREVACID) 30 MG capsule Take 30 mg by mouth daily at 12 noon.    Historical Provider, MD  loratadine (CLARITIN) 10 MG tablet Take 10 mg by mouth daily as needed for allergies.    Historical Provider, MD  LORazepam  (ATIVAN) 0.5 MG tablet Take 0.5 tablets (0.25 mg total) by mouth daily as needed for anxiety. 09/12/14   Drema Dallas, MD  mesalamine (LIALDA) 1.2 G EC tablet Take 2.4 g by mouth every evening.    Historical Provider, MD  Omega-3 Fatty Acids (FISH OIL) 1000 MG CAPS Take 1 capsule by mouth 2 (two) times daily.    Historical Provider, MD  phenol (CHLORASEPTIC) 1.4 % LIQD Use as directed 1 spray in the mouth or throat every 2 (two) hours as needed for throat irritation / pain.    Historical Provider, MD  potassium chloride (MICRO-K) 10 MEQ CR capsule Take 20 mEq by mouth daily.     Historical Provider, MD  Propylene Glycol (SYSTANE BALANCE) 0.6 % SOLN Place 1 drop into both eyes 2 (two) times daily.    Historical Provider, MD  sennosides-docusate sodium (SENOKOT-S) 8.6-50 MG tablet Take 1 tablet by mouth 2 (two) times daily.    Historical Provider, MD  traMADol (ULTRAM) 50 MG tablet Take 50 mg by mouth 2 (two) times daily as needed for moderate pain.    Historical Provider, MD  vitamin B-12 (CYANOCOBALAMIN) 500 MCG tablet Take 500 mcg by mouth daily.    Historical Provider, MD   BP 149/39 mmHg  Pulse 62  Temp(Src) 98.9 F (37.2 C) (Rectal)  Resp 28  SpO2 93% Physical Exam  Constitutional: She appears well-developed and well-nourished.  HENT:  Head: Normocephalic and atraumatic.  Eyes: Conjunctivae are normal. Right eye exhibits no discharge.  Neck: Neck supple.  Cardiovascular: Normal rate, regular rhythm and normal heart sounds.   No murmur heard. Pulmonary/Chest: She has wheezes.  Patient tachypnic, shallow breaths  Abdominal: Soft. She exhibits no distension. There is no tenderness.  Musculoskeletal: She exhibits no edema.  Skin: Skin is warm and dry. No rash noted. She is not diaphoretic.  Psychiatric:  Difficult to assess secondaryto distress  Nursing note and vitals reviewed.   ED Course  Procedures (including critical care time) Labs Review Labs Reviewed  BASIC METABOLIC  PANEL - Abnormal; Notable for the following:    Potassium 7.2 (*)    Glucose, Bld 198 (*)    BUN 54 (*)    Creatinine, Ser 1.44 (*)    GFR calc non Af Amer 33 (*)    GFR calc Af Amer 38 (*)    All other components within normal limits  CBC - Abnormal; Notable for the following:    WBC 12.3 (*)    RBC 3.57 (*)    Hemoglobin 11.5 (*)    MCV 102.8 (*)    RDW 17.1 (*)    All other components within normal limits  BLOOD GAS, VENOUS - Abnormal; Notable for the following:    pH, Ven 7.314 (*)    pCO2, Ven 58.0 (*)    pO2, Ven 148.0 (*)  Bicarbonate 28.6 (*)    All other components within normal limits  I-STAT CG4 LACTIC ACID, ED - Abnormal; Notable for the following:    Lactic Acid, Venous 0.49 (*)    All other components within normal limits  URINE CULTURE  URINALYSIS, ROUTINE W REFLEX MICROSCOPIC (NOT AT [facility]  BRAIN NATRIURETIC PEPTIDE  I-STAT TROPOININ, ED  I-STAT CG4 LACTIC ACID, ED    Imaging Review No results found. I have personally reviewed and evaluated these images and lab results as part of my medical decision-making.   EKG Interpretation   Date/Time:  Monday October 02 2014 18:44:39 EDT Ventricular Rate:  65 PR Interval:  151 QRS Duration: 82 QT Interval:  392 QTC Calculation: 408 R Axis:   49 Text Interpretation:  Sinus rhythm Probable LVH with secondary repol abnrm  st elevations in V2/V3 similar to previous. Will need repeat.  Confirmed  by Kandis Mannan ([id number]) on 10/02/2014 7:59:29 PM      MDM   Final diagnoses:  SOB (shortness of breath)    Patient is an 81 year old female with history of CHF and end-stage COPD presenting today with shortness of breath. Patient's lung sounds show mild wheezing and shallow breaths with respiratory distress. We'll place on BiPAP. Patient is requesting it, she agrees it will make her feel better. We will get x-ray to make sure that she has no developing pneumonia. Patient's potassium came back elevated at 7.2:  calcium, insulin and glucose were given.  Likely mixed picture between COPD and CHF.  Patietn looks much more comfortable on bipap, still interactive. Gas shows no acidemia, mild hypercarbia, likely chornic. Talked with hospiltilst and ICU, will admit to ICU.   Courteney Randall An, MD 10/02/14 2326

## 2014-10-02 NOTE — H&P (Signed)
[facility] History and Physical  Michelle Montour WUJ:[id number] DOB: 25-Oct-1930 DOA: 10/02/2014  Referring physician: Cammie Sickle, MD PCP: Julian Hy, MD   Chief Complaint: Shortness of breath  HPI: Michelle Bowers is a 81 y.o. female with history of CHF COPD HLD DM II CKD III presents with acute shortness of breath and hyperkalemia. Patient is currently on BIPAP. She became more short of breath from baseline today. Patient has been having some cough noted. On arrival to the ED she was noted to have increased work of breathing and was started on BIPAP. Patient is somewhat somnolent presently. I ordered a repeat ABG on the BIPAP and she was noted to have a pH of 7.29 corrected for temp. On her CXR she has some evidence of pulmonary edema,. She was also noted to have a potassium of 7.6 on presentation but there were no ECG changes noted. She has been admitted in the past with similar presentation and at that time she did not get intubated but did require BIPAP in the ICU.    Review of Systems:   12 point ROS attempted but she is not able to provide on the BIPAP  Past Medical History  Diagnosis Date  . Depression   . Hypercholesteremia   . Hypertension   . Diabetes mellitus without complication   . COPD (chronic obstructive pulmonary disease)   . Renal disorder   . Chronic kidney disease   . CHF (congestive heart failure)   . Stroke     right deficits   Past Surgical History  Procedure Laterality Date  . Abdominal hysterectomy      partial   Social History:  reports that she quit smoking about 6 years ago. She does not have any smokeless tobacco history on file. She reports that she does not drink alcohol or use illicit drugs.  Allergies  Allergen Reactions  . Codeine     unknown  . Doxazosin Mesylate     unknown  . Gemfibrozil     unknown  . Rosiglitazone Maleate     REACTION: sensitivity    History reviewed. No pertinent family history.    Prior to Admission medications   Medication Sig Start Date End Date Taking? Authorizing Provider  albuterol (PROVENTIL HFA;VENTOLIN HFA) 108 (90 BASE) MCG/ACT inhaler Inhale 2 puffs into the lungs every 6 (six) hours as needed for shortness of breath.   Yes Historical Provider, MD  ALPRAZolam (XANAX) 0.25 MG tablet Take 0.25 mg by mouth 2 (two) times daily as needed for anxiety.   Yes Historical Provider, MD  Amino Acids-Protein Hydrolys (FEEDING SUPPLEMENT, PRO-STAT SUGAR FREE 64,) LIQD Take 30 mLs by mouth daily.   Yes Historical Provider, MD  amLODipine (NORVASC) 10 MG tablet Take 10 mg by mouth daily.   Yes Historical Provider, MD  atorvastatin (LIPITOR) 10 MG tablet Take 10 mg by mouth daily.   Yes Historical Provider, MD  Cholecalciferol 1000 UNITS capsule Take 1,000 Units by mouth 2 (two) times daily.   Yes Historical Provider, MD  docusate sodium (COLACE) 100 MG capsule Take 100 mg by mouth 2 (two) times daily.   Yes Historical Provider, MD  DULoxetine (CYMBALTA) 60 MG capsule Take 60 mg by mouth daily.   Yes Historical Provider, MD  ferrous sulfate 325 (65 FE) MG tablet Take 325 mg by mouth 3 (three) times daily.   Yes Historical Provider, MD  fluticasone (FLONASE) 50 MCG/ACT nasal spray Place 2 sprays into both nostrils 2 (two)  times daily.    Yes Historical Provider, MD  furosemide (LASIX) 40 MG tablet Take 1.5 tablets (60 mg total) by mouth 2 (two) times daily. Patient taking differently: Take 60 mg by mouth 2 (two) times daily. 40 MG in the morning and 20 MG mid day 09/12/14  Yes Drema Dallas, MD  glimepiride (AMARYL) 2 MG tablet Take 2 mg by mouth daily with breakfast.   Yes Historical Provider, MD  guaifenesin (ROBITUSSIN) 100 MG/5ML syrup Take 200 mg by mouth every 4 (four) hours as needed for cough.   Yes Historical Provider, MD  hydrALAZINE (APRESOLINE) 100 MG tablet Take 100 mg by mouth 3 (three) times daily.   Yes Historical Provider, MD  Insulin Detemir (LEVEMIR) 100 UNIT/ML  Pen Inject 10 units at 10 AM, then 8 units at bedtime 09/12/14  Yes Drema Dallas, MD  insulin lispro (HUMALOG) 100 UNIT/ML injection Inject into the skin 3 (three) times daily before meals. According to sliding scale.. <120=0 units, 120-150=3 units, 151-175=5 units, >175=7 units   Yes Historical Provider, MD  ipratropium-albuterol (DUONEB) 0.5-2.5 (3) MG/3ML SOLN Take 3 mLs by nebulization every 6 (six) hours as needed. For shortness of breath   Yes Historical Provider, MD  loratadine (CLARITIN) 10 MG tablet Take 10 mg by mouth daily as needed for allergies.   Yes Historical Provider, MD  Omega-3 Fatty Acids (FISH OIL) 1000 MG CAPS Take 1 capsule by mouth 2 (two) times daily.   Yes Historical Provider, MD  omeprazole (PRILOSEC) 20 MG capsule Take 20 mg by mouth daily.   Yes Historical Provider, MD  phenol (CHLORASEPTIC) 1.4 % LIQD Use as directed 1 spray in the mouth or throat every 2 (two) hours as needed for throat irritation / pain.   Yes Historical Provider, MD  potassium chloride (MICRO-K) 10 MEQ CR capsule Take 20 mEq by mouth daily.    Yes Historical Provider, MD  Propylene Glycol (SYSTANE BALANCE) 0.6 % SOLN Place 1 drop into both eyes 2 (two) times daily.   Yes Historical Provider, MD  sennosides-docusate sodium (SENOKOT-S) 8.6-50 MG tablet Take 1 tablet by mouth 2 (two) times daily.   Yes Historical Provider, MD  traMADol (ULTRAM) 50 MG tablet Take 50 mg by mouth 2 (two) times daily as needed for moderate pain.   Yes Historical Provider, MD  vitamin B-12 (CYANOCOBALAMIN) 500 MCG tablet Take 500 mcg by mouth daily.   Yes Historical Provider, MD  feeding supplement, ENSURE ENLIVE, (ENSURE ENLIVE) LIQD Take 237 mLs by mouth 2 (two) times daily between meals. Patient not taking: Reported on 10/02/2014 09/12/14   Drema Dallas, MD  insulin aspart (NOVOLOG FLEXPEN) 100 UNIT/ML FlexPen Inject 5 Units into the skin 3 (three) times daily with meals. Patient not taking: Reported on 10/02/2014 09/12/14    Drema Dallas, MD  Insulin Pen Needle (AURORA PEN NEEDLES) 29G X MISC 5 Units by Does not apply route every morning. 09/12/14   Drema Dallas, MD  LORazepam (ATIVAN) 0.5 MG tablet Take 0.5 tablets (0.25 mg total) by mouth daily as needed for anxiety. Patient not taking: Reported on 10/02/2014 09/12/14   Drema Dallas, MD   Physical Exam: Filed Vitals:   10/02/14 1843 10/02/14 1900 10/02/14 2014 10/02/14 2110  BP:    132/33  Pulse:   62 68  Temp:  98.9 F (37.2 C)    TempSrc:  Rectal    Resp:   28 25  SpO2: 95%  93% 98%  Wt Readings from Last 3 Encounters:  09/11/14 56.1 kg (123 lb 10.9 oz)  07/07/14 55.792 kg (123 lb)    General:  Appears comfortable Eyes: PERRL, normal lids, irises & conjunctiva ENT: grossly normal hearing Neck: no LAD, masses or thyromegaly Cardiovascular: RR, no m/r/g. No LE edema. Respiratory: few ronchi. Increased WOB noted. Abdomen: soft, ntnd Skin: no rash or induration seen on limited exam Musculoskeletal: grossly normal tone BUE/BLE Psychiatric: unable to assess Neurologic: gait not checked appears to be moving all 4 extremities          Labs on Admission:  Basic Metabolic Panel:  Recent Labs Lab 10/02/14 1922 10/02/14 2045  NA 137 133*  K 7.2* 7.6*  CL 101 100*  CO2 30  --   GLUCOSE 198* 213*  BUN 54* 69*  CREATININE 1.44* 1.50*  CALCIUM 9.8  --    Liver Function Tests: No results for input(s): AST, ALT, ALKPHOS, BILITOT, PROT, ALBUMIN in the last 168 hours. No results for input(s): LIPASE, AMYLASE in the last 168 hours. No results for input(s): AMMONIA in the last 168 hours. CBC:  Recent Labs Lab 10/02/14 1922 10/02/14 2045  WBC 12.3*  --   HGB 11.5* 11.2*  HCT 36.7 33.0*  MCV 102.8*  --   PLT 229  --    Cardiac Enzymes: No results for input(s): CKTOTAL, CKMB, CKMBINDEX, TROPONINI in the last 168 hours.  BNP (last 3 results)  Recent Labs  09/03/14 0309 10/02/14 1928  BNP 377.8* 479.9*    ProBNP (last 3  results) No results for input(s): PROBNP in the last 8760 hours.  CBG: No results for input(s): GLUCAP in the last 168 hours.  Radiological Exams on Admission: Dg Chest Port 1 View  10/02/2014   CLINICAL DATA:  Severe shortness of breath for 1 day, history CHF, COPD, diabetes mellitus, hypertension, stroke  EXAM: PORTABLE CHEST - 1 VIEW  COMPARISON:  Portable exam 2001 hours compared to 09/12/2014  FINDINGS: Enlargement of cardiac silhouette.  Atherosclerotic calcification aorta.  Chronic elevation of RIGHT diaphragm with bowel interposition.  Capsular calcification at BILATERAL breast prostheses.  Pulmonary vascular congestion with scattered interstitial infiltrates likely representing pulmonary edema and CHF.  No gross pleural effusion or pneumothorax.  Bones demineralized.  IMPRESSION: Probable CHF.   Electronically Signed   By: Ulyses Southward M.D.   On: 10/02/2014 20:50        Assessment/Plan Principal Problem:   Hyperkalemia Active Problems:   Essential hypertension   COPD (chronic obstructive pulmonary disease)   GERD   Acute respiratory failure with hypercapnia   Diabetes type 2, uncontrolled   HLD (hyperlipidemia)   1. Hyperkalemia -likely due to exogenous potassium intake which will be held -will admit to step down unit. Patient received insulin D50 and albuterol in the ED repeat K pending -will also give a dose of kayexalate now -follow up potassium level in 1-2 hours -do not see any ECG changes presently but will require close monitoring  2. Acute respiratory failure with hypercapnia -CXR appears to suggest CHF with decompensation -started on BIPAP in the ED appears to be improving -will repeat ABG done with a pH of 7.3 temp corrected to 7.29 on BIPAP -will titrate FiO2 as needed  3. COPD  -will continue with nebulizers (again primary problem appears to be CHF with pulmonary edema) -will hold off on steroids for now  4. CHF -started on BIPAP in the ED -will  continue with lasix IV  continue hydralazine -monitor  IOs  5. GERD -PPIs ordered  6. HTN -continue with norvasc  7. DM type 2 Uncontrolled -will continue with home insulin regimen -place of FSBS SSI coverage  8. HLD -patient is on lipitor will be continued -will check lipid panel  9. Elevated Creatinine -appears baseline creatinine is around 1.24-1.5 -will monitor while on diuretics     Code Status: full code (must indicate code status--if unknown or must be presumed, indicate so) DVT Prophylaxis:Heparin Family Communication: none (indicate person spoken with, if applicable, with phone number if by telephone) Disposition Plan: home (indicate anticipated LOS)  Time spent:  [facility] Pager [phone]

## 2014-10-02 NOTE — Consult Note (Signed)
Name: Michelle Bowers MRN: [id number] DOB: 1930/04/29    ADMISSION DATE:  10/02/2014 CONSULTATION DATE:  10/02/2014  REFERRING MD :  Dr. Welton Flakes [facility]  CHIEF COMPLAINT:  Dyspnea  BRIEF PATIENT DESCRIPTION: 81 year old female with multiple medical problems presented with dyspnea. Improving with BiPAP. CXR suspicious for edema. TRH to admit. PCCM asked to consult.   SIGNIFICANT EVENTS  7/24-8/2 admission for HCAP vs CHF exacerbation  STUDIES:    HISTORY OF PRESENT ILLNESS:  81 year old female, resident of [facility] SNF following L thalamic CVA 6 years ago. Other PMH includes DM, "end stage" COPD, CKD, and diastolic CHF. She was recently admitted for respiratory failure secondary to PNA and CHF requiring BiPAP and nearly intubation. Hyperkalemia was also noted, and treated during that admission. She was diuresed and treated with ABX with improvement. She was discharged 8/2 after an 8 day hospitalization. Also noted to have subacute on chronic CVA. 8/22 she again presented to [facility] ED with complaints of SOB. HPI limited by BiPAP and some somnolence, however she reports that symptoms started today, she has had a non-productive cough and does endorse subjective fevers/chills. On arrival she was markedly dyspneic and started on BiPAP. CXR was concerning for pulmonary edema and she was given 40mg  lasix. Dyspnea improved. She was also noted to be profoundly hyperkalemic without ECG changes. She was given calcium, insulin, and albuterol in ED with decrease in K on repeat labs. Was then given kayexalate at time of admission. She takes potassium supplementation chronically. PCCM was consulted for further evaluation.   PAST MEDICAL HISTORY :   has a past medical history of Depression; Hypercholesteremia; Hypertension; Diabetes mellitus without complication; COPD (chronic obstructive pulmonary disease); Renal disorder; Chronic kidney disease; CHF (congestive heart failure); and Stroke.  has past surgical  history that includes Abdominal hysterectomy. Prior to Admission medications   Medication Sig Start Date End Date Taking? Authorizing Provider  albuterol (PROVENTIL HFA;VENTOLIN HFA) 108 (90 BASE) MCG/ACT inhaler Inhale 2 puffs into the lungs every 6 (six) hours as needed for shortness of breath.   Yes Historical Provider, MD  ALPRAZolam (XANAX) 0.25 MG tablet Take 0.25 mg by mouth 2 (two) times daily as needed for anxiety.   Yes Historical Provider, MD  Amino Acids-Protein Hydrolys (FEEDING SUPPLEMENT, PRO-STAT SUGAR FREE 64,) LIQD Take 30 mLs by mouth daily.   Yes Historical Provider, MD  amLODipine (NORVASC) 10 MG tablet Take 10 mg by mouth daily.   Yes Historical Provider, MD  atorvastatin (LIPITOR) 10 MG tablet Take 10 mg by mouth daily.   Yes Historical Provider, MD  Cholecalciferol 1000 UNITS capsule Take 1,000 Units by mouth 2 (two) times daily.   Yes Historical Provider, MD  docusate sodium (COLACE) 100 MG capsule Take 100 mg by mouth 2 (two) times daily.   Yes Historical Provider, MD  DULoxetine (CYMBALTA) 60 MG capsule Take 60 mg by mouth daily.   Yes Historical Provider, MD  ferrous sulfate 325 (65 FE) MG tablet Take 325 mg by mouth 3 (three) times daily.   Yes Historical Provider, MD  fluticasone (FLONASE) 50 MCG/ACT nasal spray Place 2 sprays into both nostrils 2 (two) times daily.    Yes Historical Provider, MD  furosemide (LASIX) 40 MG tablet Take 1.5 tablets (60 mg total) by mouth 2 (two) times daily. Patient taking differently: Take 60 mg by mouth 2 (two) times daily. 40 MG in the morning and 20 MG mid day 09/12/14  Yes Drema Dallas, MD  glimepiride (AMARYL) 2 MG tablet Take 2 mg by mouth daily with breakfast.   Yes Historical Provider, MD  guaifenesin (ROBITUSSIN) 100 MG/5ML syrup Take 200 mg by mouth every 4 (four) hours as needed for cough.   Yes Historical Provider, MD  hydrALAZINE (APRESOLINE) 100 MG tablet Take 100 mg by mouth 3 (three) times daily.   Yes Historical Provider,  MD  Insulin Detemir (LEVEMIR) 100 UNIT/ML Pen Inject 10 units at 10 AM, then 8 units at bedtime 09/12/14  Yes Drema Dallas, MD  insulin lispro (HUMALOG) 100 UNIT/ML injection Inject into the skin 3 (three) times daily before meals. According to sliding scale.. <120=0 units, 120-150=3 units, 151-175=5 units, >175=7 units   Yes Historical Provider, MD  ipratropium-albuterol (DUONEB) 0.5-2.5 (3) MG/3ML SOLN Take 3 mLs by nebulization every 6 (six) hours as needed. For shortness of breath   Yes Historical Provider, MD  loratadine (CLARITIN) 10 MG tablet Take 10 mg by mouth daily as needed for allergies.   Yes Historical Provider, MD  Omega-3 Fatty Acids (FISH OIL) 1000 MG CAPS Take 1 capsule by mouth 2 (two) times daily.   Yes Historical Provider, MD  omeprazole (PRILOSEC) 20 MG capsule Take 20 mg by mouth daily.   Yes Historical Provider, MD  phenol (CHLORASEPTIC) 1.4 % LIQD Use as directed 1 spray in the mouth or throat every 2 (two) hours as needed for throat irritation / pain.   Yes Historical Provider, MD  potassium chloride (MICRO-K) 10 MEQ CR capsule Take 20 mEq by mouth daily.    Yes Historical Provider, MD  Propylene Glycol (SYSTANE BALANCE) 0.6 % SOLN Place 1 drop into both eyes 2 (two) times daily.   Yes Historical Provider, MD  sennosides-docusate sodium (SENOKOT-S) 8.6-50 MG tablet Take 1 tablet by mouth 2 (two) times daily.   Yes Historical Provider, MD  traMADol (ULTRAM) 50 MG tablet Take 50 mg by mouth 2 (two) times daily as needed for moderate pain.   Yes Historical Provider, MD  vitamin B-12 (CYANOCOBALAMIN) 500 MCG tablet Take 500 mcg by mouth daily.   Yes Historical Provider, MD  feeding supplement, ENSURE ENLIVE, (ENSURE ENLIVE) LIQD Take 237 mLs by mouth 2 (two) times daily between meals. Patient not taking: Reported on 10/02/2014 09/12/14   Drema Dallas, MD  insulin aspart (NOVOLOG FLEXPEN) 100 UNIT/ML FlexPen Inject 5 Units into the skin 3 (three) times daily with meals. Patient  not taking: Reported on 10/02/2014 09/12/14   Drema Dallas, MD  Insulin Pen Needle (AURORA PEN NEEDLES) 29G X MISC 5 Units by Does not apply route every morning. 09/12/14   Drema Dallas, MD  LORazepam (ATIVAN) 0.5 MG tablet Take 0.5 tablets (0.25 mg total) by mouth daily as needed for anxiety. Patient not taking: Reported on 10/02/2014 09/12/14   Drema Dallas, MD   Allergies  Allergen Reactions  . Codeine     unknown  . Doxazosin Mesylate     unknown  . Gemfibrozil     unknown  . Rosiglitazone Maleate     REACTION: sensitivity    FAMILY HISTORY:  family history is not on file. SOCIAL HISTORY:  reports that she quit smoking about 6 years ago. She does not have any smokeless tobacco history on file. She reports that she does not drink alcohol or use illicit drugs.  REVIEW OF SYSTEMS:   Bolds are positive  Constitutional: weight loss, gain, night sweats, Fevers, chills, fatigue .  HEENT: headaches, Sore throat,  sneezing, nasal congestion, post nasal drip, Difficulty swallowing, Tooth/dental problems, visual complaints visual changes, ear ache CV:  chest pain, radiates: ,Orthopnea, PND, swelling in lower extremities, dizziness, palpitations, syncope.  GI  heartburn, indigestion, abdominal pain, nausea, vomiting, diarrhea, change in bowel habits, loss of appetite, bloody stools.  Resp: cough, productive: , hemoptysis, dyspnea, chest pain, pleuritic.  Skin: rash or itching or icterus GU: dysuria, change in color of urine, urgency or frequency. flank pain, hematuria  MS: joint pain or swelling. decreased range of motion  Psych: change in mood or affect. depression or anxiety.  Neuro: difficulty with speech, weakness, numbness, ataxia    SUBJECTIVE:   VITAL SIGNS: Temp:  [97.5 F (36.4 C)-98.9 F (37.2 C)] 98.9 F (37.2 C) (08/22 1900) Pulse Rate:  [62-72] 72 (08/22 2151) Resp:  [25-37] 27 (08/22 2151) BP: (132-149)/(33-39) 142/36 mmHg (08/22 2151) SpO2:  [87 %-98 %] 97 %  (08/22 2151)  PHYSICAL EXAMINATION: General:  Elderly, chronically ill appearing female of normal body habitus on BiPAP Neuro:  Somnolent, but easily arouses to voice and answers questions/follow commands.  HEENT:  [place]/AT, PERRL, mild JVD Cardiovascular:  RRR, no MRG. No peripheral edema Lungs:  Coarse bilateral breath sounds. Diminished bases R>L. Resps unlabored on BiPAP Abdomen:  Soft, non-tender, non-distended Musculoskeletal:  No acute deformity Skin:  Grossly intact   Recent Labs Lab 10/02/14 1922 10/02/14 2045 10/02/14 2146  NA 137 133* 134*  K 7.2* 7.6* 6.1*  CL 101 100* 99*  CO2 30  --   --   BUN 54* 69* 51*  CREATININE 1.44* 1.50* 1.50*  GLUCOSE 198* 213* 318*    Recent Labs Lab 10/02/14 1922 10/02/14 2045 10/02/14 2146  HGB 11.5* 11.2* 10.5*  HCT 36.7 33.0* 31.0*  WBC 12.3*  --   --   PLT 229  --   --    Dg Chest Port 1 View  10/02/2014   CLINICAL DATA:  Severe shortness of breath for 1 day, history CHF, COPD, diabetes mellitus, hypertension, stroke  EXAM: PORTABLE CHEST - 1 VIEW  COMPARISON:  Portable exam 2001 hours compared to 09/12/2014  FINDINGS: Enlargement of cardiac silhouette.  Atherosclerotic calcification aorta.  Chronic elevation of RIGHT diaphragm with bowel interposition.  Capsular calcification at BILATERAL breast prostheses.  Pulmonary vascular congestion with scattered interstitial infiltrates likely representing pulmonary edema and CHF.  No gross pleural effusion or pneumothorax.  Bones demineralized.  IMPRESSION: Probable CHF.   Electronically Signed   By: Ulyses Southward M.D.   On: 10/02/2014 20:50    ASSESSMENT / PLAN:  Acute on chronic hypercarbic/hypoxemic respiratory failure Acute on chronic diastolic CHF Pulmonary Edema COPD without acute exacerbation Elevated R hemidiaphragm with Chiladitis Syndrome  81 year old female with history as above with reported 1 day history of SOB with non-productive cough. Does not appear overtly volume  overloaded on exam, however, CXR consistent with pulmonary edema pattern. Has history of exacerbations in recent past and improved with diuresis, BiPAP.  - PRN BiPAP for increased work of breathing - Wean FiO2 to keep SpO2 > 90% - Diuresis per primary team. Recommend 40mg  IV lasix BID initially.  - Strict I&O, Insert foley - Will change bronchodilators to nebs while on BiPAP - Repeat ABG in one hour to gauge response to therapies - May need intubation if ABG or mental status worsen on BiPAP - Repeat CXR in AM  Hyperkalemia - Management per primary team - Holding supplementation   Joneen Roach, AGACNP-BC [facility]  Pager [phone] or [phone] 10/02/2014 10:57 PM   Attending Note:  I have examined patient, reviewed labs, studies and notes. I have discussed the case with Henreitta Leber, and I agree with the data and plans as amended above. She has recurrent acute on chronic resp failure apparently due to acute systolic CHF superimposed on chronic lung disease and overall debilitation. Restrictive lung disease from elevated R HD probably also a contributor. She appears to be stabilizing on NIPPV. We will follow her with diuresis, attempt to wean from BiPAP in am 8/23.   Levy Pupa, MD, PhD 10/03/2014, 2:27 AM [facility] [phone] or if no answer [phone]

## 2014-10-02 NOTE — Progress Notes (Signed)
Pt transported from ED to ICU24 on bipap without incident.  Pt tolerated well and remains on bipap at 35%.  RT will continue to monitor pt.

## 2014-10-02 NOTE — ED Notes (Signed)
Pt is from [facility]. Upon Fire's arrival she aws 78% O2. Started feeling SOB yesterday. Lethargic. Hx of COPD and CHF. Alert and oriented.

## 2014-10-03 ENCOUNTER — Inpatient Hospital Stay (HOSPITAL_COMMUNITY): Payer: Medicare Other

## 2014-10-03 ENCOUNTER — Encounter (HOSPITAL_COMMUNITY): Payer: Self-pay

## 2014-10-03 ENCOUNTER — Other Ambulatory Visit (HOSPITAL_COMMUNITY): Payer: Medicare Other

## 2014-10-03 DIAGNOSIS — J9611 Chronic respiratory failure with hypoxia: Secondary | ICD-10-CM | POA: Diagnosis present

## 2014-10-03 DIAGNOSIS — J810 Acute pulmonary edema: Secondary | ICD-10-CM

## 2014-10-03 LAB — GLUCOSE, CAPILLARY
GLUCOSE-CAPILLARY: 134 mg/dL — AB (ref 65–99)
GLUCOSE-CAPILLARY: 88 mg/dL (ref 65–99)
GLUCOSE-CAPILLARY: 234 mg/dL — AB (ref 65–99)
GLUCOSE-CAPILLARY: 241 mg/dL — AB (ref 65–99)
Glucose-Capillary: 52 mg/dL — ABNORMAL LOW (ref 65–99)
Glucose-Capillary: 308 mg/dL — ABNORMAL HIGH (ref 65–99)

## 2014-10-03 LAB — BLOOD GAS, ARTERIAL
ACID-BASE EXCESS: 2.7 mmol/L — AB (ref 0.0–2.0)
Bicarbonate: 29 mEq/L — ABNORMAL HIGH (ref 20.0–24.0)
DELIVERY SYSTEMS: POSITIVE
Drawn by: 232811
EXPIRATORY PAP: 8
FIO2: 0.35
INSPIRATORY PAP: 16
O2 Saturation: 96 %
PCO2 ART: 57.7 mmHg — AB (ref 35.0–45.0)
PH ART: 7.323 — AB (ref 7.350–7.450)
Patient temperature: 98.9
TCO2: 27.3 mmol/L (ref 0–100)
pO2, Arterial: 85.8 mmHg (ref 80.0–100.0)

## 2014-10-03 LAB — CBC
HCT: 35.1 % — ABNORMAL LOW (ref 36.0–46.0)
HEMOGLOBIN: 10.9 g/dL — AB (ref 12.0–15.0)
MCH: 32.2 pg (ref 26.0–34.0)
MCHC: 31.1 g/dL (ref 30.0–36.0)
MCV: 103.8 fL — ABNORMAL HIGH (ref 78.0–100.0)
Platelets: 225 10*3/uL (ref 150–400)
RBC: 3.38 MIL/uL — AB (ref 3.87–5.11)
RDW: 17.3 % — ABNORMAL HIGH (ref 11.5–15.5)
WBC: 10.8 10*3/uL — ABNORMAL HIGH (ref 4.0–10.5)

## 2014-10-03 LAB — BASIC METABOLIC PANEL
ANION GAP: 5 (ref 5–15)
Anion gap: 8 (ref 5–15)
BUN: 54 mg/dL — AB (ref 6–20)
BUN: 54 mg/dL — AB (ref 6–20)
CHLORIDE: 100 mmol/L — AB (ref 101–111)
CHLORIDE: 105 mmol/L (ref 101–111)
CO2: 29 mmol/L (ref 22–32)
CO2: 30 mmol/L (ref 22–32)
Calcium: 10.1 mg/dL (ref 8.9–10.3)
Calcium: 10.3 mg/dL (ref 8.9–10.3)
Creatinine, Ser: 1.45 mg/dL — ABNORMAL HIGH (ref 0.44–1.00)
Creatinine, Ser: 1.46 mg/dL — ABNORMAL HIGH (ref 0.44–1.00)
GFR calc Af Amer: 38 mL/min — ABNORMAL LOW (ref >60)
GFR calc Af Amer: 37 mL/min — ABNORMAL LOW (ref >60)
GFR calc non Af Amer: 33 mL/min — ABNORMAL LOW (ref >60)
GFR, EST NON AFRICAN AMERICAN: 32 mL/min — AB (ref >60)
GLUCOSE: 194 mg/dL — AB (ref 65–99)
GLUCOSE: 89 mg/dL (ref 65–99)
POTASSIUM: 6.6 mmol/L — AB (ref 3.5–5.1)
POTASSIUM: 5.8 mmol/L — AB (ref 3.5–5.1)
Sodium: 137 mmol/L (ref 135–145)
Sodium: 140 mmol/L (ref 135–145)

## 2014-10-03 LAB — LIPID PANEL
CHOL/HDL RATIO: 2.4 ratio
CHOLESTEROL: 135 mg/dL (ref 0–200)
HDL: 57 mg/dL (ref >40)
LDL Cholesterol: 60 mg/dL (ref 0–99)
Triglycerides: 88 mg/dL (ref <150)
VLDL: 18 mg/dL (ref 0–40)

## 2014-10-03 LAB — URINALYSIS, ROUTINE W REFLEX MICROSCOPIC
Bilirubin Urine: NEGATIVE
GLUCOSE, UA: NEGATIVE mg/dL
Hgb urine dipstick: NEGATIVE
Ketones, ur: NEGATIVE mg/dL
NITRITE: NEGATIVE
PH: 5 (ref 5.0–8.0)
PROTEIN: NEGATIVE mg/dL
Specific Gravity, Urine: 1.01 (ref 1.005–1.030)
Urobilinogen, UA: 0.2 mg/dL (ref 0.0–1.0)

## 2014-10-03 LAB — TROPONIN I
TROPONIN I: 0.03 ng/mL (ref <0.031)
Troponin I: 0.03 ng/mL (ref <0.031)
Troponin I: 0.03 ng/mL (ref <0.031)

## 2014-10-03 LAB — URINE MICROSCOPIC-ADD ON

## 2014-10-03 LAB — MRSA PCR SCREENING: MRSA by PCR: NEGATIVE

## 2014-10-03 LAB — TSH: TSH: 1.317 u[IU]/mL (ref 0.350–4.500)

## 2014-10-03 MED ORDER — CETYLPYRIDINIUM CHLORIDE 0.05 % MT LIQD
7.0000 mL | Freq: Two times a day (BID) | OROMUCOSAL | Status: DC
  Administered 2014-10-03 – 2014-10-09 (×12): 7 mL via OROMUCOSAL

## 2014-10-03 MED ORDER — FUROSEMIDE 10 MG/ML IJ SOLN
60.0000 mg | Freq: Four times a day (QID) | INTRAMUSCULAR | Status: AC
  Administered 2014-10-03 (×2): 60 mg via INTRAVENOUS
  Filled 2014-10-03 (×2): qty 6

## 2014-10-03 MED ORDER — SODIUM CHLORIDE 0.9 % IV SOLN
1.0000 g | Freq: Once | INTRAVENOUS | Status: AC
  Administered 2014-10-03: 1 g via INTRAVENOUS
  Filled 2014-10-03: qty 10

## 2014-10-03 MED ORDER — DEXTROSE 50 % IV SOLN
INTRAVENOUS | Status: AC
  Administered 2014-10-03: 25 mL
  Filled 2014-10-03: qty 50

## 2014-10-03 MED ORDER — FUROSEMIDE 10 MG/ML IJ SOLN: 60.0000 mg | Freq: Every day | INTRAMUSCULAR | Status: DC

## 2014-10-03 MED ORDER — DEXTROSE 5 % IV SOLN
INTRAVENOUS | Status: DC
  Administered 2014-10-03: 09:00:00 via INTRAVENOUS
  Administered 2014-10-04: 1000 mL via INTRAVENOUS

## 2014-10-03 MED ORDER — INSULIN ASPART 100 UNIT/ML ~~LOC~~ SOLN
10.0000 [IU] | Freq: Once | SUBCUTANEOUS | Status: AC
  Administered 2014-10-03: 10 [IU] via SUBCUTANEOUS

## 2014-10-03 MED ORDER — DEXTROSE 50 % IV SOLN
1.0000 | Freq: Once | INTRAVENOUS | Status: AC
  Administered 2014-10-03: 50 mL via INTRAVENOUS
  Filled 2014-10-03: qty 50

## 2014-10-03 MED ORDER — CHLORHEXIDINE GLUCONATE 0.12 % MT SOLN
15.0000 mL | Freq: Two times a day (BID) | OROMUCOSAL | Status: DC
  Administered 2014-10-04 – 2014-10-09 (×10): 15 mL via OROMUCOSAL
  Filled 2014-10-03 (×11): qty 15

## 2014-10-03 NOTE — Progress Notes (Signed)
Inpatient Diabetes Program Recommendations  AACE/ADA: New Consensus Statement on Inpatient Glycemic Control (2013)  Target Ranges:  Prepandial:   less than 140 mg/dL      Peak postprandial:   less than 180 mg/dL (1-2 hours)      Critically ill patients:  140 - 180 mg/dL   Reason for Visit: Hyper- and Hypoglycemia  Diabetes history: DM2 Outpatient Diabetes medications: Levemir 16 units QHS, Humalog s/s, Amaryl 2 mg QAM Current orders for Inpatient glycemic control: Levemir 16 units QHS, Novolog moderate tidwc, Amaryl 2 mg QAM  Results for Michelle Bowers, Michelle Bowers (MRN [id number]) as of 10/03/2014 11:37  Ref. Range 09/12/2014 06:18 09/12/2014 07:38 09/12/2014 12:23 10/02/2014 23:53 10/03/2014 03:53 10/03/2014 07:35 10/03/2014 08:14  Glucose-Capillary Latest Ref Range: 65-99 mg/dL 409 (H) 811 (H) 914 (H) 189 (H) 134 (H) 52 (L) 88   Hypoglycemia this am.  Patient takes basal insulin BID at home. Basal insulin really affects fasting glucose. Patient had hypoglycemia this am, consider reducing dose back down but ordering BID. Consider ordering home dose basal insulin 10 units QAM, 8 units QHS.  Will continue to follow. Thank you. Ailene Ards, RD, LDN, CDE Inpatient Diabetes Coordinator [phone]

## 2014-10-03 NOTE — Progress Notes (Signed)
Name: Michelle Bowers MRN: [id number] DOB: [birth year]    ADMISSION DATE:  10/02/2014 CONSULTATION DATE:  10/02/2014  REFERRING MD :  Dr. Welton Flakes [facility]  CHIEF COMPLAINT:  Dyspnea  BRIEF PATIENT DESCRIPTION: 81 year old female, SNF resident, with multiple medical problems presented with dyspnea.  CXR concerning for edema.  Rx'd with BiPAP. TRH to admit. PCCM asked to consult.   SIGNIFICANT EVENTS  7/24-8/2 admission for HCAP vs CHF exacerbation 8/23  Admit with dyspnea, pulmonary edema  8/24  Remains on bipap, drowsy but arouses   STUDIES:      SUBJECTIVE: RN reports hypoglycemia this am.  Treated with 1/2 amp dextrose.  Remains on bipap. More alert but remains drowsy.  VITAL SIGNS: Temp:  [97.3 F (36.3 C)-98.9 F (37.2 C)] 97.5 F (36.4 C) (08/23 0354) Pulse Rate:  [55-72] 58 (08/23 0600) Resp:  [16-37] 24 (08/23 0600) BP: (108-152)/(17-39) 126/26 mmHg (08/23 0600) SpO2:  [87 %-100 %] 100 % (08/23 0600) FiO2 (%):  [35 %] 35 % (08/23 0400) Weight:  [115 lb 8.3 oz (52.4 kg)] 115 lb 8.3 oz (52.4 kg) (08/22 2310)  PHYSICAL EXAMINATION: General:  Elderly, chronically ill appearing female of normal body habitus on BiPAP Neuro:  Somnolent, but arouses to stimulation and answers questions/follow commands.  HEENT:  [place]/AT, PERRL, mild JVD Cardiovascular:  RRR, no MRG. No peripheral edema Lungs:  Coarse bilateral breath sounds. Diminished bases R>L. Resps unlabored on BiPAP Abdomen:  Soft, non-tender, non-distended Musculoskeletal:  No acute deformity Skin:  Grossly intact   Recent Labs Lab 10/02/14 1922  10/02/14 2146 10/02/14 2349 10/03/14 0541  NA 137  < > 134* 137 140  K 7.2*  < > 6.1* 6.6* 5.8*  CL 101  < > 99* 100* 105  CO2 30  --   --  29 30  BUN 54*  < > 51* 54* 54*  CREATININE 1.44*  < > 1.50* 1.45* 1.46*  GLUCOSE 198*  < > 318* 194* 89  < > = values in this interval not displayed.  Recent Labs Lab 10/02/14 1922 10/02/14 2045 10/02/14 2146 10/02/14 2349    HGB 11.5* 11.2* 10.5* 10.9*  HCT 36.7 33.0* 31.0* 35.1*  WBC 12.3*  --   --  10.8*  PLT 229  --   --  225   Dg Chest Port 1 View  10/02/2014   CLINICAL DATA:  Severe shortness of breath for 1 day, history CHF, COPD, diabetes mellitus, hypertension, stroke  EXAM: PORTABLE CHEST - 1 VIEW  COMPARISON:  Portable exam 2001 hours compared to 09/12/2014  FINDINGS: Enlargement of cardiac silhouette.  Atherosclerotic calcification aorta.  Chronic elevation of RIGHT diaphragm with bowel interposition.  Capsular calcification at BILATERAL breast prostheses.  Pulmonary vascular congestion with scattered interstitial infiltrates likely representing pulmonary edema and CHF.  No gross pleural effusion or pneumothorax.  Bones demineralized.  IMPRESSION: Probable CHF.   Electronically Signed   By: Ulyses Southward M.D.   On: 10/02/2014 20:50    ASSESSMENT / PLAN:  Acute on chronic hypercarbic/hypoxemic respiratory failure Acute on chronic diastolic CHF Pulmonary Edema COPD without acute exacerbation Elevated R hemidiaphragm with Chiladitis Syndrome  81 year old female with history as above with reported 1 day history of SOB with non-productive cough. Does not appear overtly volume overloaded on exam, however, CXR consistent with pulmonary edema pattern. Has history of exacerbations in recent past and improved with diuresis, BiPAP.  - PRN BiPAP for increased work of breathing - Wean  FiO2 to keep SpO2 > 90% - Diuresis per primary team. 40mg  IV lasix QD  - Strict I&O, Insert foley - Will change bronchodilators to nebs while on BiPAP - Repeat ABG if mental status changes - Monitor on SDU for need of intubation - Repeat CXR now  Hyperkalemia - Management per primary team - Holding supplementation  Hypoglycemia  -Consider holding levemir, defer to primary -add D5w @ 50 ml/hr   Canary Brim, NP-C [facility] Pgr: [phone] or if no answer [phone] 10/03/2014, 8:04  AM    Attending:  I have seen and examined the patient with nurse practitioner/resident and agree with the note above.   [name] is breathing easier this morning, requested to come off BIPAP  On my exam she still is using accessory muscles for respirations but no paradoxical movement, rhonchi in bases of lungs, no edema, systolic murmur and JVD noted  CXR images personally reviewed showing infiltrate/likely edema in bases of lungs   Acute respiratory failure with hypoxemia > due to pulm edema, doubt pneumonia as no fever, cough > will monitor off of BIPAP today but I suspect she will need it again, will increase frequency of lasix.  Will monitor closely today Rest as above.  Heber [place], MD [facility] Pager: [phone] Cell: [phone] After 3pm or if no response, call [phone]

## 2014-10-03 NOTE — Care Management Note (Signed)
Case Management Note  Patient Details  Name: Michelle Bowers MRN: [id number] Date of Birth: Jun 12, 1930  Subjective/Objective:       Respiratory distress requiring bipap             Action/Plan: Date:  October 03, 2014 U.R. performed for needs and level of care. Will continue to follow for Case Management needs.  Marcelle Smiling, RN, BSN, [place]   [phone]  Expected Discharge Date:   (UNKNOWN)               Expected Discharge Plan:  Skilled Nursing Facility  In-House Referral:  Clinical Social Work, NA  Discharge planning Services  CM Consult  Post Acute Care Choice:  NA Choice offered to:  NA  DME Arranged:  N/A DME Agency:  NA  HH Arranged:  NA HH Agency:  NA  Status of Service:  Completed, signed off  Medicare Important Message Given:    Date Medicare IM Given:    Medicare IM give by:    Date Additional Medicare IM Given:    Additional Medicare Important Message give by:     If discussed at Long Length of Stay Meetings, dates discussed:    Additional Comments:  Golda Acre, RN 10/03/2014, 10:45 AM

## 2014-10-03 NOTE — Progress Notes (Signed)
Hypoglycemic Event  CBG:52  Treatment: Dextrose 25ml  Symptoms: None  Follow-up CBG: Time  CBG Result:  Possible Reasons for Event: Pt in BIPAP  Comments/MD notified:    Tenley Winward, Delsa Sale  Remember to initiate Hypoglycemia Order Set & complete

## 2014-10-03 NOTE — Progress Notes (Addendum)
[facility] PROGRESS NOTE  Michelle Bowers ZOX:[id number] DOB: [birth year] DOA: 10/02/2014 PCP: Julian Hy, MD   HPI: Michelle Bowers is a 81 y.o. female with history of CHF COPD HLD DM II CKD III presents with acute shortness of breath and hyperkalemia. Patient is currently on BIPAP. She became more short of breath from baseline today. Patient has been having some cough noted. On arrival to the ED she was noted to have increased work of breathing and was started on BIPAP. Patient is somewhat somnolent presently. I ordered a repeat ABG on the BIPAP and she was noted to have a pH of 7.29 corrected for temp. On her CXR she has some evidence of pulmonary edema,. She was also noted to have a potassium of 7.6 on presentation but there were no ECG changes noted. She has been admitted in the past with similar presentation and at that time she did not get intubated but did require BIPAP in the ICU.   Assessment/Plan: 1. Hyperkalemia - Patient received insulin D50 and albuterol in the ED as well as a dose of kayexalate -Potassium has improved to 5.8 from 7.6 - Pt is being aggressively diuresed (see below). Anticipate K to further improve with diuresis  2. Acute respiratory failure with hypercapnia -CXR appears to suggest CHF with decompensation -started on BIPAP in the ED appears to be improving -Pulmonary is following - Patient is continued on 60mg  IV lasix q6hrs per Pulmonary - cont to wean O2 as tolerated  3. COPD -will continue with nebulizers as tolerated -will hold off on steroids for now  4. CHF, possible acute on chronic failure -started on BIPAP in the ED -will continue with lasix IV continue hydralazine -monitor IOs - Recent 2d echo from 09/04/14 with EF of 60-65%  5. GERD -PPIs ordered  6. HTN -continue with norvasc  7. DM type 2 Uncontrolled -will continue with home insulin regimen -place of FSBS SSI coverage  8. HLD -patient is on lipitor,  continue -LDL of 60  9. CKD 3 Creatinine -appears baseline creatinine is around 1.24-1.5 - Cr currently 1.46 which is at baseline  Code Status: Full Family Communication: Pt in room (indicate person spoken with, relationship, and if by phone, the number) Disposition Plan: Pending   Consultants:  Critical Care  Procedures:    Antibiotics:    HPI/Subjective: Lethargic on bipap, unable to obtain  Objective: Filed Vitals:   10/03/14 1140 10/03/14 1200 10/03/14 1305 10/03/14 1400  BP: 174/28  169/30 172/24  Pulse: 65  65 65  Temp:  97.7 F (36.5 C)    TempSrc:  Oral    Resp: 24  28 31   Height:      Weight:      SpO2: 84%  91% 93%    Intake/Output Summary (Last 24 hours) at 10/03/14 1505 Last data filed at 10/03/14 1400  Gross per 24 hour  Intake    383 ml  Output    900 ml  Net   -517 ml   Filed Weights   10/02/14 2310  Weight: 52.4 kg (115 lb 8.3 oz)    Exam:   General:  Lethargic, somewhat arousable, in nad  Cardiovascular: regular, s1, s2  Respiratory: normal resp effort, no bipap, coarse breath sounds  Abdomen: soft, obese, nondistended  Musculoskeletal: perfused, no clubbing   Data Reviewed: Basic Metabolic Panel:  Recent Labs Lab 10/02/14 1922 10/02/14 2045 10/02/14 2146 10/02/14 2349 10/03/14 0541  NA 137 133* 134* 137 140  K  7.2* 7.6* 6.1* 6.6* 5.8*  CL 101 100* 99* 100* 105  CO2 30  --   --  29 30  GLUCOSE 198* 213* 318* 194* 89  BUN 54* 69* 51* 54* 54*  CREATININE 1.44* 1.50* 1.50* 1.45* 1.46*  CALCIUM 9.8  --   --  10.1 10.3   Liver Function Tests: No results for input(s): AST, ALT, ALKPHOS, BILITOT, PROT, ALBUMIN in the last 168 hours. No results for input(s): LIPASE, AMYLASE in the last 168 hours. No results for input(s): AMMONIA in the last 168 hours. CBC:  Recent Labs Lab 10/02/14 1922 10/02/14 2045 10/02/14 2146 10/02/14 2349  WBC 12.3*  --   --  10.8*  HGB 11.5* 11.2* 10.5* 10.9*  HCT 36.7 33.0* 31.0*  35.1*  MCV 102.8*  --   --  103.8*  PLT 229  --   --  225   Cardiac Enzymes:  Recent Labs Lab 10/02/14 2349 10/03/14 0541 10/03/14 1137  TROPONINI 0.03 0.03 0.03   BNP (last 3 results)  Recent Labs  09/03/14 0309 10/02/14 1928  BNP 377.8* 479.9*    ProBNP (last 3 results) No results for input(s): PROBNP in the last 8760 hours.  CBG:  Recent Labs Lab 10/02/14 2353 10/03/14 0353 10/03/14 0735 10/03/14 0814 10/03/14 1256  GLUCAP 189* 134* 52* 88 234*    Recent Results (from the past 240 hour(s))  MRSA PCR Screening     Status: None   Collection Time: 10/03/14 12:45 AM  Result Value Ref Range Status   MRSA by PCR NEGATIVE NEGATIVE Final    Comment:        The GeneXpert MRSA Assay (FDA approved for NASAL specimens only), is one component of a comprehensive MRSA colonization surveillance program. It is not intended to diagnose MRSA infection nor to guide or monitor treatment for MRSA infections.      Studies: Dg Chest Port 1 View  10/03/2014   CLINICAL DATA:  Pulmonary edema.  Shortness of breath  EXAM: PORTABLE CHEST - 1 VIEW  COMPARISON:  10/02/2014  FINDINGS: Cardiopericardial enlargement which is stable. Mitral annular calcification is bulky. Stable aortic and hilar contours.  Bibasilar lung opacities persist despite better lung volumes. Diffuse interstitial coarsening is improved.  IMPRESSION: 1. Mild improvement in pulmonary edema. 2. Improved lung volumes but persistent bibasilar atelectasis or pneumonia.   Electronically Signed   By: Marnee Spring M.D.   On: 10/03/2014 08:41   Dg Chest Port 1 View  10/02/2014   CLINICAL DATA:  Severe shortness of breath for 1 day, history CHF, COPD, diabetes mellitus, hypertension, stroke  EXAM: PORTABLE CHEST - 1 VIEW  COMPARISON:  Portable exam 2001 hours compared to 09/12/2014  FINDINGS: Enlargement of cardiac silhouette.  Atherosclerotic calcification aorta.  Chronic elevation of RIGHT diaphragm with bowel  interposition.  Capsular calcification at BILATERAL breast prostheses.  Pulmonary vascular congestion with scattered interstitial infiltrates likely representing pulmonary edema and CHF.  No gross pleural effusion or pneumothorax.  Bones demineralized.  IMPRESSION: Probable CHF.   Electronically Signed   By: Ulyses Southward M.D.   On: 10/02/2014 20:50    Scheduled Meds: . amLODipine  10 mg Oral Daily  . antiseptic oral rinse  7 mL Mouth Rinse q12n4p  . aspirin EC  325 mg Oral Daily  . atorvastatin  10 mg Oral q1800  . [START ON 10/04/2014] chlorhexidine  15 mL Mouth Rinse BID  . cholecalciferol  1,000 Units Oral BID  . cyanocobalamin  500 mcg  Oral Daily  . docusate sodium  100 mg Oral BID  . DULoxetine  60 mg Oral Daily  . feeding supplement (PRO-STAT SUGAR FREE 64)  30 mL Oral Daily  . ferrous sulfate  325 mg Oral TID  . fluticasone  2 spray Each Nare BID  . furosemide  60 mg Intravenous Q6H  . glimepiride  2 mg Oral Q breakfast  . heparin  5,000 Units Subcutaneous 3 times per day  . hydrALAZINE  100 mg Oral TID  . insulin aspart  0-15 Units Subcutaneous 6 times per day  . insulin detemir  8 Units Subcutaneous Q2200  . omega-3 acid ethyl esters  1 g Oral Daily  . pantoprazole  40 mg Oral Daily  . polyvinyl alcohol  1 drop Both Eyes BID  . senna-docusate  1 tablet Oral BID  . sodium chloride  3 mL Intravenous Q12H   Continuous Infusions: . dextrose 50 mL/hr at 10/03/14 1610    Principal Problem:   Acute respiratory failure with hypercapnia Active Problems:   Essential hypertension   COPD (chronic obstructive pulmonary disease)   GERD   Hyperkalemia   Diabetes type 2, uncontrolled   HLD (hyperlipidemia)   Acute pulmonary edema   Acute diastolic CHF (congestive heart failure)    CHIU, STEPHEN K  [facility] Pager [phone]. If 7PM-7AM, please contact night-coverage at [email or web address], password [email or web address] 10/03/2014, 3:05 PM  LOS: 1 day

## 2014-10-04 ENCOUNTER — Inpatient Hospital Stay (HOSPITAL_COMMUNITY): Payer: Medicare Other

## 2014-10-04 DIAGNOSIS — J9601 Acute respiratory failure with hypoxia: Secondary | ICD-10-CM

## 2014-10-04 DIAGNOSIS — E875 Hyperkalemia: Secondary | ICD-10-CM

## 2014-10-04 DIAGNOSIS — N183 Chronic kidney disease, stage 3 unspecified: Secondary | ICD-10-CM | POA: Diagnosis present

## 2014-10-04 LAB — BASIC METABOLIC PANEL
Anion gap: 8 (ref 5–15)
BUN: 57 mg/dL — AB (ref 6–20)
CO2: 30 mmol/L (ref 22–32)
Calcium: 9.3 mg/dL (ref 8.9–10.3)
Chloride: 94 mmol/L — ABNORMAL LOW (ref 101–111)
Creatinine, Ser: 1.26 mg/dL — ABNORMAL HIGH (ref 0.44–1.00)
GFR calc Af Amer: 45 mL/min — ABNORMAL LOW (ref >60)
GFR, EST NON AFRICAN AMERICAN: 39 mL/min — AB (ref >60)
GLUCOSE: 60 mg/dL — AB (ref 65–99)
POTASSIUM: 5.3 mmol/L — AB (ref 3.5–5.1)
Sodium: 132 mmol/L — ABNORMAL LOW (ref 135–145)

## 2014-10-04 LAB — GLUCOSE, CAPILLARY
GLUCOSE-CAPILLARY: 116 mg/dL — AB (ref 65–99)
GLUCOSE-CAPILLARY: 123 mg/dL — AB (ref 65–99)
GLUCOSE-CAPILLARY: 335 mg/dL — AB (ref 65–99)
GLUCOSE-CAPILLARY: 41 mg/dL — AB (ref 65–99)
GLUCOSE-CAPILLARY: 103 mg/dL — AB (ref 65–99)
GLUCOSE-CAPILLARY: 118 mg/dL — AB (ref 65–99)
Glucose-Capillary: 135 mg/dL — ABNORMAL HIGH (ref 65–99)
Glucose-Capillary: 55 mg/dL — ABNORMAL LOW (ref 65–99)
Glucose-Capillary: 493 mg/dL — ABNORMAL HIGH (ref 65–99)
Glucose-Capillary: 557 mg/dL (ref 65–99)
Glucose-Capillary: 435 mg/dL — ABNORMAL HIGH (ref 65–99)
Glucose-Capillary: 214 mg/dL — ABNORMAL HIGH (ref 65–99)

## 2014-10-04 LAB — GLUCOSE, RANDOM: Glucose, Bld: 74 mg/dL (ref 65–99)

## 2014-10-04 MED ORDER — INSULIN ASPART 100 UNIT/ML ~~LOC~~ SOLN
0.0000 [IU] | Freq: Every day | SUBCUTANEOUS | Status: DC
  Administered 2014-10-05: 4 [IU] via SUBCUTANEOUS

## 2014-10-04 MED ORDER — DEXTROSE 50 % IV SOLN
INTRAVENOUS | Status: AC
  Filled 2014-10-04: qty 50

## 2014-10-04 MED ORDER — INSULIN ASPART 100 UNIT/ML ~~LOC~~ SOLN
5.0000 [IU] | SUBCUTANEOUS | Status: AC
  Administered 2014-10-04: 5 [IU] via SUBCUTANEOUS

## 2014-10-04 MED ORDER — DEXTROSE 50 % IV SOLN
25.0000 mL | Freq: Once | INTRAVENOUS | Status: AC
  Administered 2014-10-04: 25 mL via INTRAVENOUS

## 2014-10-04 MED ORDER — LIP MEDEX EX OINT
TOPICAL_OINTMENT | CUTANEOUS | Status: AC
  Administered 2014-10-04: 1
  Filled 2014-10-04: qty 7

## 2014-10-04 MED ORDER — INSULIN ASPART 100 UNIT/ML ~~LOC~~ SOLN
0.0000 [IU] | Freq: Three times a day (TID) | SUBCUTANEOUS | Status: DC
  Administered 2014-10-04: 15 [IU] via SUBCUTANEOUS
  Administered 2014-10-05: 5 [IU] via SUBCUTANEOUS
  Administered 2014-10-05: 2 [IU] via SUBCUTANEOUS
  Administered 2014-10-05: 8 [IU] via SUBCUTANEOUS
  Administered 2014-10-06: 2 [IU] via SUBCUTANEOUS

## 2014-10-04 MED ORDER — INSULIN ASPART 100 UNIT/ML ~~LOC~~ SOLN
15.0000 [IU] | Freq: Once | SUBCUTANEOUS | Status: AC
  Administered 2014-10-04: 15 [IU] via SUBCUTANEOUS

## 2014-10-04 MED ORDER — FUROSEMIDE 10 MG/ML IJ SOLN
40.0000 mg | Freq: Two times a day (BID) | INTRAMUSCULAR | Status: AC
  Administered 2014-10-04 (×2): 40 mg via INTRAVENOUS
  Filled 2014-10-04 (×2): qty 4

## 2014-10-04 NOTE — Care Management Important Message (Signed)
Important Message  Patient Details  Name: ABIA MONACO MRN: [id number] Date of Birth: 10/05/1930   Medicare Important Message Given:  Yes-second notification given    Haskell Flirt 10/04/2014, 1:42 PMImportant Message  Patient Details  Name: KATALENA MALVEAUX MRN: [id number] Date of Birth: 18-Jan-1931   Medicare Important Message Given:  Yes-second notification given    Haskell Flirt 10/04/2014, 1:42 PM

## 2014-10-04 NOTE — Progress Notes (Addendum)
Name: Michelle Bowers MRN: [id number] DOB: 07-17-1930    ADMISSION DATE:  10/02/2014 CONSULTATION DATE:  10/02/2014  REFERRING MD :  Dr. Welton Flakes [facility]  CHIEF COMPLAINT:  Dyspnea  BRIEF PATIENT DESCRIPTION: 81 year old female, SNF resident, with multiple medical problems presented with dyspnea.  CXR concerning for edema.  Rx'd with BiPAP. TRH to admit. PCCM asked to consult.   SIGNIFICANT EVENTS  7/24-8/2 admission for HCAP vs CHF exacerbation 8/23  Admit with dyspnea, pulmonary edema  8/24  Remains on bipap, drowsy but arouses   STUDIES:     SUBJECTIVE: Net negative 500 post lasix 8/23.  Off bipap, alert.  Remains on 14L/55%  VITAL SIGNS: Temp:  [96.7 F (35.9 C)-98 F (36.7 C)] 98 F (36.7 C) (08/24 0800) Pulse Rate:  [58-70] 61 (08/24 0600) Resp:  [21-31] 26 (08/24 0600) BP: (145-181)/(24-38) 151/38 mmHg (08/24 0400) SpO2:  [84 %-98 %] 98 % (08/24 0600) FiO2 (%):  [55 %] 55 % (08/24 0400) Weight:  [115 lb 1.3 oz (52.2 kg)] 115 lb 1.3 oz (52.2 kg) (08/24 0500)  PHYSICAL EXAMINATION: General:  Elderly, chronically ill appearing female of normal body habitus on VM Neuro:  Awakens to voice, answers questions/follow commands.  HEENT:  Wanda/AT, PERRL, mild JVD Cardiovascular:  RRR, no RG. SEM.  No peripheral edema Lungs:  Faint crackles in bases, even/non-labored Abdomen:  Soft, non-tender, non-distended Musculoskeletal:  No acute deformity, hemi-paresis on R Skin:  Grossly intact   Recent Labs Lab 10/02/14 2349 10/03/14 0541 10/04/14 0340  NA 137 140 132*  K 6.6* 5.8* 5.3*  CL 100* 105 94*  CO2 BUN 54* 54* 57*  CREATININE 1.45* 1.46* 1.26*  GLUCOSE 194* 89 60*    Recent Labs Lab 10/02/14 1922 10/02/14 2045 10/02/14 2146 10/02/14 2349  HGB 11.5* 11.2* 10.5* 10.9*  HCT 36.7 33.0* 31.0* 35.1*  WBC 12.3*  --   --  10.8*  PLT 229  --   --  225   Dg Chest Port 1 View  10/04/2014   CLINICAL DATA:  Respiratory failure.  EXAM: PORTABLE CHEST - 1 VIEW   COMPARISON:  10/03/2014.  08/07/2014 .  FINDINGS: Mediastinum hilar structures stable. Heart size stable. Mitral annular calcification. Persistent low lung volumes with basilar atelectasis. Progressive bilateral pulmonary interstitial prominence. Small bilateral pleural effusions. Congestive heart failure could present this fashion. Bilateral pneumonitis cannot be excluded. Calcified right breast implant.  IMPRESSION: 1. Progressive bilateral pulmonary edema. Small bilateral pleural effusions. Findings consistent with congestive heart failure.  2.  Low lung volumes with basilar atelectasis.   Electronically Signed   By: Maisie Fus  Register   On: 10/04/2014 07:10   Dg Chest Port 1 View  10/03/2014   CLINICAL DATA:  Pulmonary edema.  Shortness of breath  EXAM: PORTABLE CHEST - 1 VIEW  COMPARISON:  10/02/2014  FINDINGS: Cardiopericardial enlargement which is stable. Mitral annular calcification is bulky. Stable aortic and hilar contours.  Bibasilar lung opacities persist despite better lung volumes. Diffuse interstitial coarsening is improved.  IMPRESSION: 1. Mild improvement in pulmonary edema. 2. Improved lung volumes but persistent bibasilar atelectasis or pneumonia.   Electronically Signed   By: Marnee Spring M.D.   On: 10/03/2014 08:41   Dg Chest Port 1 View  10/02/2014   CLINICAL DATA:  Severe shortness of breath for 1 day, history CHF, COPD, diabetes mellitus, hypertension, stroke  EXAM: PORTABLE CHEST - 1 VIEW  COMPARISON:  Portable exam 2001 hours compared to  09/12/2014  FINDINGS: Enlargement of cardiac silhouette.  Atherosclerotic calcification aorta.  Chronic elevation of RIGHT diaphragm with bowel interposition.  Capsular calcification at BILATERAL breast prostheses.  Pulmonary vascular congestion with scattered interstitial infiltrates likely representing pulmonary edema and CHF.  No gross pleural effusion or pneumothorax.  Bones demineralized.  IMPRESSION: Probable CHF.   Electronically Signed   By:  Ulyses Southward M.D.   On: 10/02/2014 20:50    ASSESSMENT / PLAN:  Acute on chronic hypercarbic/hypoxemic respiratory failure Acute on chronic diastolic CHF Pulmonary Edema COPD without acute exacerbation Elevated R hemidiaphragm with Chiladitis Syndrome  81 year old female admitted with a 1 day history of SOB with non-productive cough. Does not appear overtly volume overloaded on exam, however, CXR consistent with pulmonary edema pattern. Has history of exacerbations in recent past and improved with diuresis, BiPAP.  - PRN BiPAP for increased work of breathing - Wean FiO2 to keep SpO2 > 90% - Lasix 40 mg BID x2 doses 8/24  - Strict I&O, Insert foley - Will change bronchodilators to nebs while on intermittent BiPAP - Repeat ABG only if mental status changes - Intermittent CXR   Hyperkalemia - Management per primary team - Holding supplementation  Hypoglycemia  -Consider holding levemir & amaryl, defer to primary -add D5w @ 50 ml/hr   Canary Brim, NP-C [facility] Pgr: [phone] or if no answer [phone] 10/04/2014, 8:23 AM   Attending:  I have seen and examined the patient with nurse practitioner/resident and agree with the note above.   Ms. Deitrick says she doesn't feel any better  Lungs with improved respiratory effort today, few crackles in bases CV exam with systolic murmur, improved JVD  CXR images personally reviewed> I think that the R base lung is showing improved aeration  Cr improved today  Acute respiratory failure with hypoxemia due to acute pulmonary edema> improving from my standpoint, continue lasix, wean O2  Goals of care> she is exceedingly frail; in the event of cardiopulmonary arrest CPR and mechanical ventilation would likely not be medically beneficial.  I discussed this with her daughter at length on the phone and asked her to discuss this with her mother.  I strongly recommended DNR.  [place]ber [place], MD [place] [facility] Pager:  [phone] Cell: [phone] After 3pm or if no response, call [phone]

## 2014-10-04 NOTE — Progress Notes (Signed)
Spoke with patients daughter [name] today. Pt usually receives 40 mg po lasix in am and  po lasix in the afternoon. She also wears 2-4 Liters of home O2 and O2 levels are monitored at the nursing home. Will information to MD

## 2014-10-04 NOTE — Progress Notes (Signed)
MD notified of pts 1200 CBG. CBG was 557 and was retaken as 493. MD updateds that the patient is still on D5% maintenance fluid and has a 2gram sodium diet. MD changed pts sliding scale, diet orders, and turned off maintenance fluid. Pt had pancakes with syrup and apple juice for breakfast and ate most of her meal. For lunch she just asked for some ensure. Pt has been given as small amount of ensure and the insulin. Will recheck CBG every hour until pt has stable CBG.

## 2014-10-04 NOTE — Progress Notes (Signed)
RT came to bedside to place patient on BiPAP; patient had increased WOB and was returned to previous settings 16/8 RR 12 and 35%. Patient is more comfortable and vitals are stable.  RT will continue to monitor

## 2014-10-04 NOTE — Progress Notes (Signed)
PROGRESS NOTE  Michelle Bowers ZOX:[id number] DOB: 03/08/1930 DOA: 10/02/2014 PCP: Julian Hy, MD  HPI/Recap of past 41 hours: 81 year old female with past history of diastolic CHF, diabetes mellitus with stage III chronic kidney disease and COPD admitted on 8/22 with acute respiratory failure requiring BiPAP. On admission, patient noted to be in pulmonary edema. Patient started on Lasix and transferred to ICU.  Critical care consulted.    Over the next few days, patient has had slow improvement. Able to be weaned off of BiPAP by 8/24. Now requiring less oxygen. Today she complains of persistent shortness of breath, dyspnea on exertion and overall fatigue, although improved from admission.  Assessment/Plan: Principal Problem:   Acute respiratory failure with hypercapnia/hypoxia secondary to acute diastolic CHF and chronic COPD: Continue diuresing. Able to be weaned off of BiPAP, currently on 5 L nasal cannula with oxygen saturations greater than 88% Active Problems:   Essential hypertension: Stable continue on home medications    GERD   Hyperkalemia: Presented with potassium of 7.6. Treated with D50 and albuterol the emergency room plus Kayexalate. With diuresis, now stable   Diabetes type 2, uncontrolled with episodes of hypoglycemia: D5 added yesterday which resulted in significant hyperglycemia today the patient is eating. Have discontinued IV fluids and put her on better sliding-scale coverage, continue to monitor.     CKD (chronic kidney disease) stage 3, GFR 30-59 ml/min: Stable, continue to monitor   Code Status: Full code  Family Communication: Left message with family   Disposition Plan: as oxygenation improves, transfer to floor likely in the next 24 hours    Consultants:  Critical care   Procedures: None  Antibiotics:  None   Objective: BP 169/32 mmHg  Pulse 62  Temp(Src) 97.5 F (36.4 C) (Oral)  Resp 24  Ht 5\' 3"  (1.6 m)  Wt 52.2 kg (115 lb 1.3  oz)  BMI 20.39 kg/m2  SpO2 96%  Intake/Output Summary (Last 24 hours) at 10/04/14 1409 Last data filed at 10/04/14 0800  Gross per 24 hour  Intake    900 ml  Output   1125 ml  Net   -225 ml   Filed Weights   10/02/14 2310 10/04/14 0500  Weight: 52.4 kg (115 lb 8.3 oz) 52.2 kg (115 lb 1.3 oz)    Exam:   General:  Alert and oriented 2, moderate distress secondary to respiratory distress   Cardiovascular: regular rhythm, 2/6 systolic ejection murmur, borderline tachycardia   Respiratory: basilar crackles   Abdomen: Soft, nontender, nondistended, positive bowel sounds   Musculoskeletal: no clubbing or cyanosis, trace pitting edema bilaterally    Data Reviewed: Basic Metabolic Panel:  Recent Labs Lab 10/02/14 1922 10/02/14 2045 10/02/14 2146 10/02/14 2349 10/03/14 0541 10/04/14 0340  NA 137 133* 134* 137 140 132*  K 7.2* 7.6* 6.1* 6.6* 5.8* 5.3*  CL 101 100* 99* 100* 105 94*  CO2 30  --   --  29 30 30   GLUCOSE 198* 213* 318* 194* 89 60*  BUN 54* 69* 51* 54* 54* 57*  CREATININE 1.44* 1.50* 1.50* 1.45* 1.46* 1.26*  CALCIUM 9.8  --   --  10.1 10.3 9.3   Liver Function Tests: No results for input(s): AST, ALT, ALKPHOS, BILITOT, PROT, ALBUMIN in the last 168 hours. No results for input(s): LIPASE, AMYLASE in the last 168 hours. No results for input(s): AMMONIA in the last 168 hours. CBC:  Recent Labs Lab 10/02/14 1922 10/02/14 2045 10/02/14 2146 10/02/14 2349  WBC 12.3*  --   --  10.8*  HGB 11.5* 11.2* 10.5* 10.9*  HCT 36.7 33.0* 31.0* 35.1*  MCV 102.8*  --   --  103.8*  PLT 229  --   --  225   Cardiac Enzymes:    Recent Labs Lab 10/02/14 2349 10/03/14 0541 10/03/14 1137  TROPONINI 0.03 0.03 0.03   BNP (last 3 results)  Recent Labs  09/03/14 0309 10/02/14 1928  BNP 377.8* 479.9*    ProBNP (last 3 results) No results for input(s): PROBNP in the last 8760 hours.  CBG:  Recent Labs Lab 10/04/14 0526 10/04/14 0745 10/04/14 1200  10/04/14 1203 10/04/14 1333  GLUCAP 116* 123* 557* 493* 435*    Recent Results (from the past 240 hour(s))  Urine culture     Status: None (Preliminary result)   Collection Time: 10/03/14 12:45 AM  Result Value Ref Range Status   Specimen Description URINE, CATHETERIZED  Final   Special Requests NONE  Final   Culture   Final    TOO YOUNG TO READ Performed at [facility]    Report Status PENDING  Incomplete  MRSA PCR Screening     Status: None   Collection Time: 10/03/14 12:45 AM  Result Value Ref Range Status   MRSA by PCR NEGATIVE NEGATIVE Final    Comment:        The GeneXpert MRSA Assay (FDA approved for NASAL specimens only), is one component of a comprehensive MRSA colonization surveillance program. It is not intended to diagnose MRSA infection nor to guide or monitor treatment for MRSA infections.      Studies: Dg Chest Port 1 View  10/04/2014   CLINICAL DATA:  Respiratory failure.  EXAM: PORTABLE CHEST - 1 VIEW  COMPARISON:  10/03/2014.  08/07/2014 .  FINDINGS: Mediastinum hilar structures stable. Heart size stable. Mitral annular calcification. Persistent low lung volumes with basilar atelectasis. Progressive bilateral pulmonary interstitial prominence. Small bilateral pleural effusions. Congestive heart failure could present this fashion. Bilateral pneumonitis cannot be excluded. Calcified right breast implant.  IMPRESSION: 1. Progressive bilateral pulmonary edema. Small bilateral pleural effusions. Findings consistent with congestive heart failure.  2.  Low lung volumes with basilar atelectasis.   Electronically Signed   By: Maisie Fus  Register   On: 10/04/2014 07:10    Scheduled Meds: . amLODipine  10 mg Oral Daily  . antiseptic oral rinse  7 mL Mouth Rinse q12n4p  . aspirin EC  325 mg Oral Daily  . atorvastatin  10 mg Oral q1800  . chlorhexidine  15 mL Mouth Rinse BID  . cholecalciferol  1,000 Units Oral BID  . cyanocobalamin  500 mcg Oral Daily  .  docusate sodium  100 mg Oral BID  . DULoxetine  60 mg Oral Daily  . feeding supplement (PRO-STAT SUGAR FREE 64)  30 mL Oral Daily  . ferrous sulfate  325 mg Oral TID  . fluticasone  2 spray Each Nare BID  . furosemide  40 mg Intravenous BID  . heparin  5,000 Units Subcutaneous 3 times per day  . hydrALAZINE  100 mg Oral TID  . insulin aspart  0-15 Units Subcutaneous TID WC  . insulin aspart  0-5 Units Subcutaneous QHS  . omega-3 acid ethyl esters  1 g Oral Daily  . pantoprazole  40 mg Oral Daily  . polyvinyl alcohol  1 drop Both Eyes BID  . senna-docusate  1 tablet Oral BID  . sodium chloride  3 mL Intravenous Q12H    Continuous Infusions:  Time spent: 35 minutes  Hollice Espy  [facility] Pager [phone]. If 7PM-7AM, please contact night-coverage at [email or web address], password [facility] 10/04/2014, 2:09 PM  LOS: 2 days

## 2014-10-05 DIAGNOSIS — I5031 Acute diastolic (congestive) heart failure: Secondary | ICD-10-CM

## 2014-10-05 DIAGNOSIS — J9602 Acute respiratory failure with hypercapnia: Secondary | ICD-10-CM

## 2014-10-05 DIAGNOSIS — Z515 Encounter for palliative care: Secondary | ICD-10-CM

## 2014-10-05 DIAGNOSIS — R0602 Shortness of breath: Secondary | ICD-10-CM

## 2014-10-05 LAB — GLUCOSE, CAPILLARY
GLUCOSE-CAPILLARY: 124 mg/dL — AB (ref 65–99)
GLUCOSE-CAPILLARY: 222 mg/dL — AB (ref 65–99)
GLUCOSE-CAPILLARY: 274 mg/dL — AB (ref 65–99)
Glucose-Capillary: 110 mg/dL — ABNORMAL HIGH (ref 65–99)
Glucose-Capillary: 335 mg/dL — ABNORMAL HIGH (ref 65–99)

## 2014-10-05 LAB — URINE CULTURE: Culture: 30000

## 2014-10-05 LAB — BASIC METABOLIC PANEL
ANION GAP: 10 (ref 5–15)
BUN: 53 mg/dL — ABNORMAL HIGH (ref 6–20)
CHLORIDE: 96 mmol/L — AB (ref 101–111)
CO2: 29 mmol/L (ref 22–32)
Calcium: 9.1 mg/dL (ref 8.9–10.3)
Creatinine, Ser: 1.39 mg/dL — ABNORMAL HIGH (ref 0.44–1.00)
GFR calc non Af Amer: 34 mL/min — ABNORMAL LOW (ref >60)
GFR, EST AFRICAN AMERICAN: 40 mL/min — AB (ref >60)
Glucose, Bld: 103 mg/dL — ABNORMAL HIGH (ref 65–99)
POTASSIUM: 4.6 mmol/L (ref 3.5–5.1)
Sodium: 135 mmol/L (ref 135–145)

## 2014-10-05 MED ORDER — FUROSEMIDE 10 MG/ML IJ SOLN
60.0000 mg | Freq: Two times a day (BID) | INTRAMUSCULAR | Status: AC
  Administered 2014-10-05 (×2): 60 mg via INTRAVENOUS
  Filled 2014-10-05 (×3): qty 6

## 2014-10-05 NOTE — Progress Notes (Signed)
PROGRESS NOTE  Haw[place]i Shaler SEG:[id number] DOB: 07/12/1930 DOA: 10/02/2014 PCP: Julian Hy, MD  HPI/Recap of past 69 hours: 81 year old female with past history of diastolic CHF, diabetes mellitus with stage III chronic kidney disease and COPD admitted on 8/22 with acute respiratory failure requiring BiPAP. On admission, patient noted to be in pulmonary edema. Patient started on Lasix and transferred to ICU.  Critical care consulted.    Over the next few days, patient has had slow improvement. Able to be weaned off of BiPAP by 8/24 and down to oxygen by facemask, but required BiPAP overnight. This morning able to go back to oxygen by facemask. He says that she is not feeling too much better. Feeling breathing is not getting much better  Assessment/Plan: Principal Problem:   Acute respiratory failure with hypercapnia/hypoxia secondary to acute diastolic CHF and chronic COPD: Continue diuresing. Continues to require BiPAP alternating with facemask oxygen. Had an extensive discussion with critical care and the patient who currently is a full code. We talked about the possibilities that should the patient worsen or if she would want to be on a breathing machine long-term and she does not think so, but she is not sure midline discussed with her daughter. Long-term, I'm concerned about her making it through his hospitalization and have asked palliative care for consultation for goals of care Active Problems:   Essential hypertension: Stable continue on home medications    GERD   Hyperkalemia: Presented with potassium of 7.6. Treated with D50 and albuterol the emergency room plus Kayexalate. With diuresis, now stable   Diabetes type 2, uncontrolled with episodes of hypoglycemia: D5 added yesterday which resulted in significant hyperglycemia today the patient is eating. Have discontinued IV fluids and put her on better sliding-scale coverage, continue to monitor. Some episodes of hypoglycemia  last night, looks better today    CKD (chronic kidney disease) stage 3, GFR 30-59 ml/min: Stable, continue to monitor   Code Status: Full code  Family Communication: Left message with family   Disposition Plan: Appears to be not making too much improvement. Goals of care meeting   Consultants:  Critical care   Palliative care  Procedures: None  Antibiotics:  None   Objective: BP 148/33 mmHg  Pulse 73  Temp(Src) 97.9 F (36.6 C) (Axillary)  Resp 26  Ht 5\' 3"  (1.6 m)  Wt 52.5 kg (115 lb 11.9 oz)  BMI 20.51 kg/m2  SpO2 95%  Intake/Output Summary (Last 24 hours) at 10/05/14 1556 Last data filed at 10/05/14 0900  Gross per 24 hour  Intake     40 ml  Output    895 ml  Net   -855 ml   Filed Weights   10/02/14 2310 10/04/14 0500 10/05/14 0536  Weight: 52.4 kg (115 lb 8.3 oz) 52.2 kg (115 lb 1.3 oz) 52.5 kg (115 lb 11.9 oz)    Exam:   General:  Alert and oriented 2, quite fatigued.  Cardiovascular: regular rhythm, 2/6 systolic ejection murmur, borderline tachycardia   Respiratory: basilar crackles , Limited inspiratory effort  Abdomen: Soft, nontender, nondistended, positive bowel sounds   Musculoskeletal: no clubbing or cyanosis, trace pitting edema bilaterally    Data Reviewed: Basic Metabolic Panel:  Recent Labs Lab 10/02/14 1922  10/02/14 2146 10/02/14 2349 10/03/14 0541 10/04/14 0340 10/04/14 2150 10/05/14 0330  NA 137  < > 134* 137 140 132*  --  135  K 7.2*  < > 6.1* 6.6* 5.8* 5.3*  --  4.6  CL 101  < > 99* 100* 105 94*  --  96*  CO2 30  --   --  --  29  GLUCOSE 198*  < > 318* 194* 89 60* 74 103*  BUN 54*  < > 51* 54* 54* 57*  --  53*  CREATININE 1.44*  < > 1.50* 1.45* 1.46* 1.26*  --  1.39*  CALCIUM 9.8  --   --  10.1 10.3 9.3  --  9.1  < > = values in this interval not displayed. Liver Function Tests: No results for input(s): AST, ALT, ALKPHOS, BILITOT, PROT, ALBUMIN in the last 168 hours. No results for input(s): LIPASE,  AMYLASE in the last 168 hours. No results for input(s): AMMONIA in the last 168 hours. CBC:  Recent Labs Lab 10/02/14 1922 10/02/14 2045 10/02/14 2146 10/02/14 2349  WBC 12.3*  --   --  10.8*  HGB 11.5* 11.2* 10.5* 10.9*  HCT 36.7 33.0* 31.0* 35.1*  MCV 102.8*  --   --  103.8*  PLT 229  --   --  225   Cardiac Enzymes:    Recent Labs Lab 10/02/14 2349 10/03/14 0541 10/03/14 1137  TROPONINI 0.03 0.03 0.03   BNP (last 3 results)  Recent Labs  09/03/14 0309 10/02/14 1928  BNP 377.8* 479.9*    ProBNP (last 3 results) No results for input(s): PROBNP in the last 8760 hours.  CBG:  Recent Labs Lab 10/04/14 1720 10/04/14 1744 10/04/14 2120 10/05/14 0054 10/05/14 0738  GLUCAP 41* 103* 118* 110* 124*    Recent Results (from the past 240 hour(s))  Urine culture     Status: None   Collection Time: 10/03/14 12:45 AM  Result Value Ref Range Status   Specimen Description URINE, CATHETERIZED  Final   Special Requests NONE  Final   Culture   Final    30,000 COLONIES/mL YEAST Performed at [facility]    Report Status 10/05/2014 FINAL  Final  MRSA PCR Screening     Status: None   Collection Time: 10/03/14 12:45 AM  Result Value Ref Range Status   MRSA by PCR NEGATIVE NEGATIVE Final    Comment:        The GeneXpert MRSA Assay (FDA approved for NASAL specimens only), is one component of a comprehensive MRSA colonization surveillance program. It is not intended to diagnose MRSA infection nor to guide or monitor treatment for MRSA infections.      Studies: No results found.  Scheduled Meds: . amLODipine  10 mg Oral Daily  . antiseptic oral rinse  7 mL Mouth Rinse q12n4p  . aspirin EC  325 mg Oral Daily  . atorvastatin  10 mg Oral q1800  . chlorhexidine  15 mL Mouth Rinse BID  . cholecalciferol  1,000 Units Oral BID  . cyanocobalamin  500 mcg Oral Daily  . docusate sodium  100 mg Oral BID  . DULoxetine  60 mg Oral Daily  . feeding supplement  (PRO-STAT SUGAR FREE 64)  30 mL Oral Daily  . ferrous sulfate  325 mg Oral TID  . fluticasone  2 spray Each Nare BID  . furosemide  60 mg Intravenous BID  . heparin  5,000 Units Subcutaneous 3 times per day  . hydrALAZINE  100 mg Oral TID  . insulin aspart  0-15 Units Subcutaneous TID WC  . insulin aspart  0-5 Units Subcutaneous QHS  . omega-3 acid ethyl esters  1 g Oral Daily  .  pantoprazole  40 mg Oral Daily  . polyvinyl alcohol  1 drop Both Eyes BID  . senna-docusate  1 tablet Oral BID  . sodium chloride  3 mL Intravenous Q12H    Continuous Infusions:    Time spent: 25 minutes  Hollice Espy  [facility] Pager [phone]. If 7PM-7AM, please contact night-coverage at [email or web address], password [facility] 10/05/2014, 3:56 PM  LOS: 3 days

## 2014-10-05 NOTE — Clinical Social Work Note (Addendum)
Clinical Social Work Assessment  Patient Details  Name: Michelle Bowers MRN: [id number] Date of Birth: 16-Sep-1930  Date of referral:  10/05/14               Reason for consult:  Facility Placement, Discharge Planning                Permission sought to share information with:  Facility Art therapist granted to share information::  Yes, Verbal Permission Granted  Name::        Agency::     Relationship::     Contact Information:     Housing/Transportation Living arrangements for the past 2 months:  [place] of Information:  Patient, Facility Patient Interpreter Needed:  None Criminal Activity/Legal Involvement Pertinent to Current Situation/Hospitalization:  No - Comment as needed Significant Relationships:  Adult Children Lives with:  Facility Resident Do you feel safe going back to the place where you live?  Yes Need for family participation in patient care:  Yes (Comment)  Care giving concerns:  No concerns reported at this time.   Social Worker assessment / plan:  Pt hospitalized on 10/02/14 with SOB. CSW consulted to assist with d/c planning. PN reviewed. CSW met with pt. No family at bedside. Pt confirmed she was from [facility] and planned to return following hospital d/c. CSW was contacted by SNF requesting clinical updates. SNF reports that family was not holding pt's bed but they would readmit pt pending bed availability. CSW will continue to follow to assist with d/c planning.  Employment status:  Retired Forensic scientist:  Medicaid [place], [employer] PT Recommendations:  Not assessed at this time Information / Referral to community resources:  [place]  Patient/Family's Response to care:  Pt would like to return to [facility] when she is feeling better.  Patient/Family's Understanding of and Emotional Response to Diagnosis, Current Treatment, and Prognosis:  MD PN indicate pt's medical status  has been  reviewed with pt / daughter. Pt was SOB during CSW visit and nsg assistance was requested. Dx, treatment, prognosis were not addressed at this time.  Emotional Assessment Appearance:  Appears stated age Attitude/Demeanor/Rapport:  Other (cooperative) Affect (typically observed):  Anxious Orientation:  Oriented to Self, Oriented to Place, Oriented to  Time, Oriented to Situation Alcohol / Substance use:  Not Applicable Psych involvement (Current and /or in the community):  No (Comment)  Discharge Needs  Concerns to be addressed:  Discharge Planning Concerns Readmission within the last 30 days:  Yes Current discharge risk:  None Barriers to Discharge:  No Barriers Identified   Luretha Rued, [employer] 10/05/2014, 3:25 PM

## 2014-10-05 NOTE — Progress Notes (Signed)
Name: Michelle Bowers MRN: [id number] DOB: Nov 16, 1930    ADMISSION DATE:  10/02/2014 CONSULTATION DATE:  10/02/2014  REFERRING MD :  Dr. Welton Flakes [facility]  CHIEF COMPLAINT:  Dyspnea  BRIEF PATIENT DESCRIPTION: 81 year old female, SNF resident, with multiple medical problems presented with dyspnea.  CXR concerning for edema.  Rx'd with BiPAP. TRH to admit. PCCM asked to consult.   SIGNIFICANT EVENTS  7/24-8/2 admission for HCAP vs CHF exacerbation 8/23  Admit with dyspnea, pulmonary edema  8/24  Remains on bipap, drowsy but arouses  8/25  Bipap overnight, awake/alert.  Discussed vent/CPR with patient and she would like to talk with her family  STUDIES:     SUBJECTIVE:  Pt wore bipap last PM for increased work of breathing.  No distress this am.  Placed on 6L/35%.  Net negative 685 8/24  VITAL SIGNS: Temp:  [97.5 F (36.4 C)-98.4 F (36.9 C)] 97.9 F (36.6 C) (08/25 0800) Pulse Rate:  [62-79] 62 (08/25 0749) Resp:  [18-30] 27 (08/25 0749) BP: (139-177)/(32-53) 148/40 mmHg (08/25 0749) SpO2:  [86 %-99 %] 99 % (08/25 0749) FiO2 (%):  [35 %-55 %] 35 % (08/25 0749) Weight:  [115 lb 11.9 oz (52.5 kg)] 115 lb 11.9 oz (52.5 kg) (08/25 0536)  PHYSICAL EXAMINATION: General:  Elderly, chronically ill appearing female of normal body habitus on VM Neuro:  Awakens to voice, answers questions/follow commands. Appropriate.   HEENT:  /AT, PERRL, mild JVD Cardiovascular:  RRR, no RG. SEM.  No peripheral edema Lungs:  Even/non-labored on VM, lungs bilaterally diminished, faint basilar crackles  Abdomen:  Soft, non-tender, non-distended Musculoskeletal:  No acute deformity, hemi-paresis on R Skin:  Grossly intact   Recent Labs Lab 10/03/14 0541 10/04/14 0340 10/04/14 2150 10/05/14 0330  NA 140 132*  --  135  K 5.8* 5.3*  --  4.6  CL 105 94*  --  96*  CO2 30 30  --  29  BUN 54* 57*  --  53*  CREATININE 1.46* 1.26*  --  1.39*  GLUCOSE 89 60* 74 103*    Recent Labs Lab  10/02/14 1922 10/02/14 2045 10/02/14 2146 10/02/14 2349  HGB 11.5* 11.2* 10.5* 10.9*  HCT 36.7 33.0* 31.0* 35.1*  WBC 12.3*  --   --  10.8*  PLT 229  --   --  225   Dg Chest Port 1 View  10/04/2014   CLINICAL DATA:  Respiratory failure.  EXAM: PORTABLE CHEST - 1 VIEW  COMPARISON:  10/03/2014.  08/07/2014 .  FINDINGS: Mediastinum hilar structures stable. Heart size stable. Mitral annular calcification. Persistent low lung volumes with basilar atelectasis. Progressive bilateral pulmonary interstitial prominence. Small bilateral pleural effusions. Congestive heart failure could present this fashion. Bilateral pneumonitis cannot be excluded. Calcified right breast implant.  IMPRESSION: 1. Progressive bilateral pulmonary edema. Small bilateral pleural effusions. Findings consistent with congestive heart failure.  2.  Low lung volumes with basilar atelectasis.   Electronically Signed   By: Maisie Fus  Register   On: 10/04/2014 07:10    ASSESSMENT / PLAN:  Acute on chronic hypercarbic/hypoxemic respiratory failure Acute on chronic diastolic CHF Pulmonary Edema COPD without acute exacerbation Elevated R hemidiaphragm with Chiladitis Syndrome  81 year old female admitted with a 1 day history of SOB with non-productive cough. Does not appear overtly volume overloaded on exam, however, CXR consistent with pulmonary edema pattern. Has history of exacerbations in recent past and improved with diuresis, BiPAP.  - PRN BiPAP for increased work of  breathing - Wean FiO2 to keep SpO2 > 90% - Strict I&O, Insert foley - Bronchodilators to nebs while on intermittent BiPAP - Repeat ABG only if mental status changes - Intermittent CXR   Hyperkalemia - Management per primary team - Holding supplementation  Hypoglycemia  -Consider holding levemir & amaryl, defer to primary -add D5w @ 50 ml/hr   GLOBAL: Overall patient has not made significant improvement since admission.  She has intermittently required  bipap support.  I discussed the possibility of mechanical ventilation and CPR with her and she would like to think about it.  Given her overall medical state, mechanical ventilation and CPR would not likely be of benefit.  Primary SVC consulting palliative care.     Canary Brim, NP-C [facility] Pgr: [phone] or if no answer [phone] 10/05/2014, 9:11 AM    Attending:  I have seen and examined the patient with nurse practitioner/resident and agree with the note above.   [name] remains fraile, dyspneic, was on BIPAP all night Still seems volume up on my exam today: basilar crackles, S3, JVD   Acute respiratory failure due to acute pulmonary edema> not coughing, no fever or leukocytosis; continue lasix again today 60mg  IV q12, I ordered. Will repeat CXR in AM, continue BIPAP prn.  I completely agree with consulting palliative medicine.  Both the patient and her daughter were not prepared for [place] to bring up discussing end of life care (my impression from our conversations), so having ongoing conversations about this are appropriate because she is very frail and would not do well with ICU level resuscitation.   Heber Tuttle, MD [place] [facility] Pager: [phone] Cell: [phone] After 3pm or if no response, call [phone]

## 2014-10-05 NOTE — Consult Note (Signed)
Consultation Note Date: 10/06/2014   Patient Name: Michelle Bowers  DOB: [birth year]  MRN: [id number]  Age / Sex: 81 y.o., female   PCP: Adrian Prince, MD Referring Physician: Hollice Espy, MD  Reason for Consultation: Establishing goals of care  Palliative Care Assessment and Plan Summary of Established Goals of Care and Medical Treatment Preferences  81 year old female with past history of diastolic CHF, diabetes mellitus with stage III chronic kidney disease and COPD admitted on 8/22 with acute respiratory failure requiring BiPAP. On admission, patient noted to be in pulmonary edema. Patient started on Lasix and transferred to ICU. Critical care consulted.   Over the next few days, patient has had slow improvement. Able to be weaned off of BiPAP by 8/24 and down to oxygen by facemask, but required BiPAP overnight on 10-05-14.  The patient has been tolerating O2 2L [place] since evening on 10-05-14 until the present time of this note. The patient is resting in bed. When the patient was seen in morning on 10-05-14, she was resting in chair on a facemask O2. She is hard of hearing. She is in no distress. Attempted to initiate code status discussions, the patient stated that she would want to get better and wished for [place] to discuss with her Michelle Bowers.   Call placed to [name]'s place of work at [employer] at [phone] on 10-05-14 around noon. [name] recalled discussing about her mother with Dr Kendrick Fries and also with Dr Rito Ehrlich. Discussed with [name] about the patient's current medical conditions requiring hospitalization, in particular her respiratory status. Code status discussions undertaken over the phone, discussed about CPR, FULL CODE versus DNR DNI.   I was informed by [name] that she would be arriving at the hospital late in the evening after work on 10-06-14. She stated that they were short staffed at work and she would not be able to attend a family meeting during  the day. She wished to discuss further with the patient about her conversations with Dr Trish Mage, myself and Dr Rito Ehrlich about code status.   Thank you for the consult, will attempt to reach out to [name] later today. Additional recommendations will be added as an addendum to this consult note.   Contacts/Participants in Discussion: Primary Decision Maker:  Patient, then her Michelle Bowers at [phone].    HCPOA: yes   Michelle Bowers.   Code Status/Advance Care Planning:  FULL CODE   Symptom Management:   No acute symptoms noted currently. Continue current medical treatment.   Palliative Prophylaxis: yes  Additional Recommendations (Limitations, Scope, Preferences):  Continue current treatment, consider PT, possible transfer out of ICU, ?needs SNF rehab on D/C Psycho-social/Spiritual:   Support System: strong, Michelle Bowers.   Desire for further Chaplaincy support:no  Prognosis: possibly weeks to months. prognosis how ever remains guarded and could be much shorter if the patient suffers an acute respiratory event.  Discharge Planning:  pending clinical course.     Values: to continue with current life maintaining life prolonging therapies.  Life limiting illness: acute on chronic hypoxic respiratory failure due to multi factorial etiologies of diastolic CHF, COPD and volume overload.       Chief Complaint/History of Present Illness: dyspnea.   Primary Diagnoses  Present on Admission:  . Acute respiratory failure with hypercapnia . COPD (chronic obstructive pulmonary disease) . Diabetes type 2, uncontrolled . Essential hypertension . GERD . HLD (hyperlipidemia) . Hyperkalemia . Acute  pulmonary edema . Acute diastolic CHF (congestive heart failure) . Acute respiratory failure with hypoxemia . CKD (chronic kidney disease) stage 3, GFR 30-59 ml/min  Palliative Review of Systems: Completed.  I have reviewed the medical record, interviewed the  patient and family, and examined the patient. The following aspects are pertinent.  Past Medical History  Diagnosis Date  . Depression   . Hypercholesteremia   . Hypertension   . Diabetes mellitus without complication   . COPD (chronic obstructive pulmonary disease)   . Renal disorder   . Chronic kidney disease   . CHF (congestive heart failure)   . Stroke     right deficits   Social History   Social History  . Marital Status: Divorced    Spouse Name: N/A  . Number of Children: N/A  . Years of Education: N/A   Social History Main Topics  . Smoking status: Former Smoker    Quit date: 07/06/2008  . Smokeless tobacco: Never Used  . Alcohol Use: No  . Drug Use: No  . Sexual Activity: No   Other Topics Concern  . None   Social History Narrative   History reviewed. No pertinent family history. Scheduled Meds: . amLODipine  10 mg Oral Daily  . antiseptic oral rinse  7 mL Mouth Rinse q12n4p  . aspirin EC  325 mg Oral Daily  . atorvastatin  10 mg Oral q1800  . chlorhexidine  15 mL Mouth Rinse BID  . cholecalciferol  1,000 Units Oral BID  . cyanocobalamin  500 mcg Oral Daily  . docusate sodium  100 mg Oral BID  . DULoxetine  60 mg Oral Daily  . feeding supplement (PRO-STAT SUGAR FREE 64)  30 mL Oral Daily  . ferrous sulfate  325 mg Oral TID  . fluticasone  2 spray Each Nare BID  . heparin  5,000 Units Subcutaneous 3 times per day  . hydrALAZINE  100 mg Oral TID  . insulin aspart  0-15 Units Subcutaneous TID WC  . insulin aspart  0-5 Units Subcutaneous QHS  . omega-3 acid ethyl esters  1 g Oral Daily  . pantoprazole  40 mg Oral Daily  . polyvinyl alcohol  1 drop Both Eyes BID  . senna-docusate  1 tablet Oral BID  . sodium chloride  3 mL Intravenous Q12H   Continuous Infusions:  PRN Meds:.sodium chloride, acetaminophen, ALPRAZolam, guaiFENesin, ipratropium-albuterol, loratadine, ondansetron (ZOFRAN) IV, sodium chloride, traMADol Medications Prior to Admission:    Prior to Admission medications   Medication Sig Start Date End Date Taking? Authorizing Provider  albuterol (PROVENTIL HFA;VENTOLIN HFA) 108 (90 BASE) MCG/ACT inhaler Inhale 2 puffs into the lungs every 6 (six) hours as needed for shortness of breath.   Yes Historical Provider, MD  ALPRAZolam (XANAX) 0.25 MG tablet Take 0.25 mg by mouth 2 (two) times daily as needed for anxiety.   Yes Historical Provider, MD  Amino Acids-Protein Hydrolys (FEEDING SUPPLEMENT, PRO-STAT SUGAR FREE 64,) LIQD Take 30 mLs by mouth daily.   Yes Historical Provider, MD  amLODipine (NORVASC) 10 MG tablet Take 10 mg by mouth daily.   Yes Historical Provider, MD  atorvastatin (LIPITOR) 10 MG tablet Take 10 mg by mouth daily.   Yes Historical Provider, MD  Cholecalciferol 1000 UNITS capsule Take 1,000 Units by mouth 2 (two) times daily.   Yes Historical Provider, MD  docusate sodium (COLACE) 100 MG capsule Take 100 mg by mouth 2 (two) times daily.   Yes Historical Provider, MD  DULoxetine (  CYMBALTA) 60 MG capsule Take 60 mg by mouth daily.   Yes Historical Provider, MD  ferrous sulfate 325 (65 FE) MG tablet Take 325 mg by mouth 3 (three) times daily.   Yes Historical Provider, MD  fluticasone (FLONASE) 50 MCG/ACT nasal spray Place 2 sprays into both nostrils 2 (two) times daily.    Yes Historical Provider, MD  furosemide (LASIX) 40 MG tablet Take 1.5 tablets (60 mg total) by mouth 2 (two) times daily. Patient taking differently: Take 60 mg by mouth 2 (two) times daily. 40 MG in the morning and 20 MG mid day 09/12/14  Yes Drema Dallas, MD  glimepiride (AMARYL) 2 MG tablet Take 2 mg by mouth daily with breakfast.   Yes Historical Provider, MD  guaifenesin (ROBITUSSIN) 100 MG/5ML syrup Take 200 mg by mouth every 4 (four) hours as needed for cough.   Yes Historical Provider, MD  hydrALAZINE (APRESOLINE) 100 MG tablet Take 100 mg by mouth 3 (three) times daily.   Yes Historical Provider, MD  Insulin Detemir (LEVEMIR) 100 UNIT/ML  Pen Inject 10 units at 10 AM, then 8 units at bedtime 09/12/14  Yes Drema Dallas, MD  insulin lispro (HUMALOG) 100 UNIT/ML injection Inject into the skin 3 (three) times daily before meals. According to sliding scale.. <120=0 units, 120-150=3 units, 151-175=5 units, >175=7 units   Yes Historical Provider, MD  ipratropium-albuterol (DUONEB) 0.5-2.5 (3) MG/3ML SOLN Take 3 mLs by nebulization every 6 (six) hours as needed. For shortness of breath   Yes Historical Provider, MD  loratadine (CLARITIN) 10 MG tablet Take 10 mg by mouth daily as needed for allergies.   Yes Historical Provider, MD  Omega-3 Fatty Acids (FISH OIL) 1000 MG CAPS Take 1 capsule by mouth 2 (two) times daily.   Yes Historical Provider, MD  omeprazole (PRILOSEC) 20 MG capsule Take 20 mg by mouth daily.   Yes Historical Provider, MD  phenol (CHLORASEPTIC) 1.4 % LIQD Use as directed 1 spray in the mouth or throat every 2 (two) hours as needed for throat irritation / pain.   Yes Historical Provider, MD  potassium chloride (MICRO-K) 10 MEQ CR capsule Take 20 mEq by mouth daily.    Yes Historical Provider, MD  Propylene Glycol (SYSTANE BALANCE) 0.6 % SOLN Place 1 drop into both eyes 2 (two) times daily.   Yes Historical Provider, MD  sennosides-docusate sodium (SENOKOT-S) 8.6-50 MG tablet Take 1 tablet by mouth 2 (two) times daily.   Yes Historical Provider, MD  traMADol (ULTRAM) 50 MG tablet Take 50 mg by mouth 2 (two) times daily as needed for moderate pain.   Yes Historical Provider, MD  vitamin B-12 (CYANOCOBALAMIN) 500 MCG tablet Take 500 mcg by mouth daily.   Yes Historical Provider, MD  feeding supplement, ENSURE ENLIVE, (ENSURE ENLIVE) LIQD Take 237 mLs by mouth 2 (two) times daily between meals. Patient not taking: Reported on 10/02/2014 09/12/14   Drema Dallas, MD  insulin aspart (NOVOLOG FLEXPEN) 100 UNIT/ML FlexPen Inject 5 Units into the skin 3 (three) times daily with meals. Patient not taking: Reported on 10/02/2014 09/12/14    Drema Dallas, MD  Insulin Pen Needle (AURORA PEN NEEDLES) 29G X MISC 5 Units by Does not apply route every morning. 09/12/14   Drema Dallas, MD  LORazepam (ATIVAN) 0.5 MG tablet Take 0.5 tablets (0.25 mg total) by mouth daily as needed for anxiety. Patient not taking: Reported on 10/02/2014 09/12/14   Drema Dallas, MD  Allergies  Allergen Reactions  . Codeine     unknown  . Doxazosin Mesylate     unknown  . Gemfibrozil     unknown  . Rosiglitazone Maleate     REACTION: sensitivity   CBC:    Component Value Date/Time   WBC 10.8* 10/02/2014 2349   HGB 10.9* 10/02/2014 2349   HCT 35.1* 10/02/2014 2349   PLT 225 10/02/2014 2349   MCV 103.8* 10/02/2014 2349   NEUTROABS 8.4* 09/11/2014 0440   LYMPHSABS 0.8 09/11/2014 0440   MONOABS 2.6* 09/11/2014 0440   EOSABS 0.2 09/11/2014 0440   BASOSABS 0.0 09/11/2014 0440   Comprehensive Metabolic Panel:    Component Value Date/Time   NA 135 10/05/2014 0330   K 4.6 10/05/2014 0330   CL 96* 10/05/2014 0330   CO2 29 10/05/2014 0330   BUN 53* 10/05/2014 0330   CREATININE 1.39* 10/05/2014 0330   GLUCOSE 103* 10/05/2014 0330   CALCIUM 9.1 10/05/2014 0330   AST 15 09/12/2014 0519   ALT 16 09/12/2014 0519   ALKPHOS 61 09/12/2014 0519   BILITOT 0.6 09/12/2014 0519   PROT 5.4* 09/12/2014 0519   ALBUMIN 2.6* 09/12/2014 0519    Physical Exam: Vital Signs: BP 157/39 mmHg  Pulse 73  Temp(Src) 98 F (36.7 C) (Oral)  Resp 27  Ht 5\' 3"  (1.6 m)  Wt 51.2 kg (112 lb 14 oz)  BMI 20.00 kg/m2  SpO2 97% SpO2: SpO2: 97 % O2 Device: O2 Device: Nasal Cannula O2 Flow Rate: O2 Flow Rate (L/min): 2 L/min Intake/output summary:   Intake/Output Summary (Last 24 hours) at 10/06/14 0834 Last data filed at 10/06/14 0600  Gross per 24 hour  Intake    670 ml  Output   1135 ml  Net   -465 ml   LBM:   Baseline Weight: Weight: 52.4 kg (115 lb 8.3 oz) Most recent weight: Weight: 51.2 kg (112 lb 14 oz)  Exam Findings:   NAD resting in bed on  2L O2 [place] clear anterior S1S2 Abdomen soft Trace edema Non focal hard of hearing.                        Palliative Performance Scale: 40% Additional Data Reviewed: Recent Labs     10/04/14  0340  10/05/14  0330  NA  132*  135  BUN  57*  53*  CREATININE  1.26*  1.39*   Discussed with Dr Rito Ehrlich.   Time In: 1100 on 10-05-14 Time Out: 1155 on 10-05-14.  Time Total: 55 minutes  Greater than 50%  of this time was spent counseling and coordinating care related to the above assessment and plan. Also seen in am on 10-06-14 Signed by: Rosalin Hawking, MD [phone]  Rosalin Hawking, MD  10/06/2014, 8:34 AM  Please contact Palliative Medicine Team phone at [phone] for questions and concerns.

## 2014-10-06 DIAGNOSIS — R0602 Shortness of breath: Secondary | ICD-10-CM | POA: Insufficient documentation

## 2014-10-06 DIAGNOSIS — I10 Essential (primary) hypertension: Secondary | ICD-10-CM

## 2014-10-06 DIAGNOSIS — Z515 Encounter for palliative care: Secondary | ICD-10-CM | POA: Insufficient documentation

## 2014-10-06 LAB — CBC
HCT: 33 % — ABNORMAL LOW (ref 36.0–46.0)
Hemoglobin: 10.8 g/dL — ABNORMAL LOW (ref 12.0–15.0)
MCH: 33.4 pg (ref 26.0–34.0)
MCHC: 32.7 g/dL (ref 30.0–36.0)
MCV: 102.2 fL — AB (ref 78.0–100.0)
PLATELETS: 259 10*3/uL (ref 150–400)
RBC: 3.23 MIL/uL — AB (ref 3.87–5.11)
RDW: 16.7 % — ABNORMAL HIGH (ref 11.5–15.5)
WBC: 7.2 10*3/uL (ref 4.0–10.5)

## 2014-10-06 LAB — BASIC METABOLIC PANEL
Anion gap: 12 (ref 5–15)
BUN: 60 mg/dL — AB (ref 6–20)
CO2: 33 mmol/L — ABNORMAL HIGH (ref 22–32)
Calcium: 9.8 mg/dL (ref 8.9–10.3)
Chloride: 94 mmol/L — ABNORMAL LOW (ref 101–111)
Creatinine, Ser: 1.27 mg/dL — ABNORMAL HIGH (ref 0.44–1.00)
GFR calc Af Amer: 44 mL/min — ABNORMAL LOW (ref >60)
GFR, EST NON AFRICAN AMERICAN: 38 mL/min — AB (ref >60)
GLUCOSE: 202 mg/dL — AB (ref 65–99)
POTASSIUM: 4.5 mmol/L (ref 3.5–5.1)
Sodium: 139 mmol/L (ref 135–145)

## 2014-10-06 LAB — GLUCOSE, CAPILLARY
GLUCOSE-CAPILLARY: 487 mg/dL — AB (ref 65–99)
Glucose-Capillary: 143 mg/dL — ABNORMAL HIGH (ref 65–99)
Glucose-Capillary: 579 mg/dL (ref 65–99)
Glucose-Capillary: 312 mg/dL — ABNORMAL HIGH (ref 65–99)
Glucose-Capillary: 401 mg/dL — ABNORMAL HIGH (ref 65–99)
Glucose-Capillary: 118 mg/dL — ABNORMAL HIGH (ref 65–99)

## 2014-10-06 MED ORDER — INSULIN ASPART 100 UNIT/ML ~~LOC~~ SOLN: 0.0000 [IU] | Freq: Every day | SUBCUTANEOUS | Status: DC

## 2014-10-06 MED ORDER — FUROSEMIDE 40 MG PO TABS
40.0000 mg | ORAL_TABLET | Freq: Every day | ORAL | Status: DC
  Administered 2014-10-06 – 2014-10-09 (×4): 40 mg via ORAL
  Filled 2014-10-06 (×4): qty 1

## 2014-10-06 MED ORDER — INSULIN GLARGINE 100 UNIT/ML ~~LOC~~ SOLN
5.0000 [IU] | Freq: Every day | SUBCUTANEOUS | Status: DC
  Administered 2014-10-06 – 2014-10-08 (×2): 5 [IU] via SUBCUTANEOUS
  Filled 2014-10-06 (×4): qty 0.05

## 2014-10-06 MED ORDER — INSULIN ASPART 100 UNIT/ML ~~LOC~~ SOLN
20.0000 [IU] | Freq: Once | SUBCUTANEOUS | Status: AC
  Administered 2014-10-06: 20 [IU] via SUBCUTANEOUS

## 2014-10-06 MED ORDER — INSULIN ASPART 100 UNIT/ML ~~LOC~~ SOLN
3.0000 [IU] | Freq: Three times a day (TID) | SUBCUTANEOUS | Status: DC
  Administered 2014-10-06 – 2014-10-09 (×8): 3 [IU] via SUBCUTANEOUS

## 2014-10-06 MED ORDER — INSULIN ASPART 100 UNIT/ML ~~LOC~~ SOLN
0.0000 [IU] | Freq: Three times a day (TID) | SUBCUTANEOUS | Status: DC
  Administered 2014-10-06: 11 [IU] via SUBCUTANEOUS
  Administered 2014-10-07: 5 [IU] via SUBCUTANEOUS
  Administered 2014-10-07: 15 [IU] via SUBCUTANEOUS
  Administered 2014-10-08 (×2): 11 [IU] via SUBCUTANEOUS
  Administered 2014-10-08: 3 [IU] via SUBCUTANEOUS
  Administered 2014-10-09: 8 [IU] via SUBCUTANEOUS
  Administered 2014-10-09: 6 [IU] via SUBCUTANEOUS

## 2014-10-06 NOTE — Progress Notes (Signed)
Md notified of patient's 579 noon blood glucose.  On recheck, patient's was blood glucose was 487. MD was notified and new orders were placed. Patient's dinner blood glucose was 312. Meal coverage was administered. Will continue to monitor.

## 2014-10-06 NOTE — Progress Notes (Signed)
PROGRESS NOTE  Michelle Bowers ZOX:[id number] DOB: 1930-04-08 DOA: 10/02/2014 PCP: Julian Hy, MD  HPI/Recap of past 53 hours: 81 year old female with past history of diastolic CHF, diabetes mellitus with stage III chronic kidney disease and COPD admitted on 8/22 with acute respiratory failure requiring BiPAP. On admission, patient noted to be in pulmonary edema. Patient started on Lasix and transferred to ICU.  Critical care consulted.    Over the next few days, patient has had slow improvement. Able to be weaned off of BiPAP by 8/24 and down to oxygen by facemask, but required BiPAP intermittently. Since 8/25, patient has had significant improvement and has been able to maintain oxygenation on 2 L nasal cannula alone. She herself is quite fatigued, but otherwise has no complaints. She is overall feeling a little bit better  Assessment/Plan: Principal Problem:   Acute respiratory failure with hypercapnia/hypoxia secondary to acute diastolic CHF and chronic COPD: Finished diuresing. Back to near baseline Had an extensive discussion with critical care and the patient who currently is a full code.   We talked about the possibilities that should the patient worsen or if she would want to be on a breathing machine long-term and she does not think so, but she is not sure midline discussed with her daughter. Long-term, I'm concerned about her making it through his hospitalization and have asked palliative care for consultation for goals of care Active Problems:   Essential hypertension: Stable continue on home medications    GERD   Hyperkalemia: Presented with potassium of 7.6. Treated with D50 and albuterol the emergency room plus Kayexalate. With diuresis, now stable   Diabetes type 2, uncontrolled with episodes of hypoglycemia: D5 added yesterday which resulted in significant hyperglycemia today the patient is eating. Have discontinued IV fluids and put her on better sliding-scale  coverage, continue to monitor. Had episode of markedly hyper glycemia this afternoon. Have added some low-dose scheduled NovoLog    CKD (chronic kidney disease) stage 3, GFR 30-59 ml/min: Stable, continue to monitor   Code Status: Full code  Family Communication: Left message with family   Disposition Plan: Continue to have goals of care discussion. Possible return back to skilled nursing on Monday  Antibiotics:  None   Objective: BP 146/51 mmHg  Pulse 101  Temp(Src) 97.4 F (36.3 C) (Oral)  Resp 27  Ht 5\' 3"  (1.6 m)  Wt 51.2 kg (112 lb 14 oz)  BMI 20.00 kg/m2  SpO2 91%  Intake/Output Summary (Last 24 hours) at 10/06/14 1455 Last data filed at 10/06/14 1019  Gross per 24 hour  Intake    670 ml  Output   1090 ml  Net   -420 ml   Filed Weights   10/04/14 0500 10/05/14 0536 10/06/14 0500  Weight: 52.2 kg (115 lb 1.3 oz) 52.5 kg (115 lb 11.9 oz) 51.2 kg (112 lb 14 oz)    Exam:   General:  Resting comfortably  Cardiovascular: regular rhythm, 2/6 systolic ejection murmur, borderline tachycardia   Respiratory:  few basilar crackles, moderate inspiratory effort  Abdomen: Soft, nondistended, hypoactive bowel sounds   Musculoskeletal: no clubbing or cyanosis, trace pitting edema bilaterally    Data Reviewed: Basic Metabolic Panel:  Recent Labs Lab 10/02/14 2349 10/03/14 0541 10/04/14 0340 10/04/14 2150 10/05/14 0330 10/06/14 0950  NA 137 140 132*  --  135 139  K 6.6* 5.8* 5.3*  --  4.6 4.5  CL 100* 105 94*  --  96* 94*  CO2 29 30  30  --  29 33*  GLUCOSE 194* 89 60* 74 103* 202*  BUN 54* 54* 57*  --  53* 60*  CREATININE 1.45* 1.46* 1.26*  --  1.39* 1.27*  CALCIUM 10.1 10.3 9.3  --  9.1 9.8   Liver Function Tests: No results for input(s): AST, ALT, ALKPHOS, BILITOT, PROT, ALBUMIN in the last 168 hours. No results for input(s): LIPASE, AMYLASE in the last 168 hours. No results for input(s): AMMONIA in the last 168 hours. CBC:  Recent Labs Lab  10/02/14 1922 10/02/14 2045 10/02/14 2146 10/02/14 2349 10/06/14 0950  WBC 12.3*  --   --  10.8* 7.2  HGB 11.5* 11.2* 10.5* 10.9* 10.8*  HCT 36.7 33.0* 31.0* 35.1* 33.0*  MCV 102.8*  --   --  103.8* 102.2*  PLT 229  --   --  225 259   Cardiac Enzymes:    Recent Labs Lab 10/02/14 2349 10/03/14 0541 10/03/14 1137  TROPONINI 0.03 0.03 0.03   BNP (last 3 results)  Recent Labs  09/03/14 0309 10/02/14 1928  BNP 377.8* 479.9*    ProBNP (last 3 results) No results for input(s): PROBNP in the last 8760 hours.  CBG:  Recent Labs Lab 10/05/14 1227 10/05/14 1611 10/05/14 2136 10/06/14 0750 10/06/14 1242  GLUCAP 222* 274* 335* 143* 579*    Recent Results (from the past 240 hour(s))  Urine culture     Status: None   Collection Time: 10/03/14 12:45 AM  Result Value Ref Range Status   Specimen Description URINE, CATHETERIZED  Final   Special Requests NONE  Final   Culture   Final    30,000 COLONIES/mL YEAST Performed at [facility]    Report Status 10/05/2014 FINAL  Final  MRSA PCR Screening     Status: None   Collection Time: 10/03/14 12:45 AM  Result Value Ref Range Status   MRSA by PCR NEGATIVE NEGATIVE Final    Comment:        The GeneXpert MRSA Assay (FDA approved for NASAL specimens only), is one component of a comprehensive MRSA colonization surveillance program. It is not intended to diagnose MRSA infection nor to guide or monitor treatment for MRSA infections.      Studies: No results found.  Scheduled Meds: . amLODipine  10 mg Oral Daily  . antiseptic oral rinse  7 mL Mouth Rinse q12n4p  . aspirin EC  325 mg Oral Daily  . atorvastatin  10 mg Oral q1800  . chlorhexidine  15 mL Mouth Rinse BID  . cholecalciferol  1,000 Units Oral BID  . cyanocobalamin  500 mcg Oral Daily  . docusate sodium  100 mg Oral BID  . DULoxetine  60 mg Oral Daily  . feeding supplement (PRO-STAT SUGAR FREE 64)  30 mL Oral Daily  . ferrous sulfate  325 mg  Oral TID  . fluticasone  2 spray Each Nare BID  . furosemide  40 mg Oral Daily  . heparin  5,000 Units Subcutaneous 3 times per day  . hydrALAZINE  100 mg Oral TID  . insulin aspart  0-15 Units Subcutaneous TID WC  . insulin aspart  0-5 Units Subcutaneous QHS  . insulin aspart  3 Units Subcutaneous TID WC  . omega-3 acid ethyl esters  1 g Oral Daily  . pantoprazole  40 mg Oral Daily  . polyvinyl alcohol  1 drop Both Eyes BID  . senna-docusate  1 tablet Oral BID  . sodium chloride  3 mL  Intravenous Q12H    Continuous Infusions:    Time spent: 15 minutes  Hollice Espy  [facility] Pager [phone]. If 7PM-7AM, please contact night-coverage at [email or web address], password [facility] 10/06/2014, 2:55 PM  LOS: 4 days

## 2014-10-06 NOTE — Progress Notes (Signed)
Name: KANITA DELAGE MRN: [id number] DOB: 1930/09/19    ADMISSION DATE:  10/02/2014 CONSULTATION DATE:  10/02/2014  REFERRING MD :  Dr. Welton Flakes [facility]  CHIEF COMPLAINT:  Dyspnea  BRIEF PATIENT DESCRIPTION: 81 year old female, SNF resident, with multiple medical problems presented with dyspnea.  CXR concerning for edema.  Rx'd with BiPAP. TRH to admit. PCCM asked to consult.   SIGNIFICANT EVENTS  7/24-8/2 admission for HCAP vs CHF exacerbation 8/23  Admit with dyspnea, pulmonary edema  8/24  Remains on bipap, drowsy but arouses  8/25  Bipap overnight, awake/alert.  Discussed vent/CPR with patient and she would like to talk with her family  STUDIES:     SUBJECTIVE:  Pt denies acute complaints. O2 weaned down to 2L.  Pt sat up in chair 4-5 hours yesterday.  Lift needed to get her up.  Seen by Palliative Care.  VITAL SIGNS: Temp:  [96.2 F (35.7 C)-98.2 F (36.8 C)] 98 F (36.7 C) (08/26 0800) Resp:  [22-34] 27 (08/26 0500) BP: (148-167)/(33-49) 157/39 mmHg (08/26 0400) SpO2:  [93 %-98 %] 97 % (08/26 0500) FiO2 (%):  [35 %] 35 % (08/25 1200) Weight:  [112 lb 14 oz (51.2 kg)] 112 lb 14 oz (51.2 kg) (08/26 0500)  PHYSICAL EXAMINATION: General:  Elderly, chronically ill appearing female in NAD Neuro:  Awake/alert, R hemi-paresis (chronic), speech clear   HEENT:  [place]/AT, PERRL, mild JVD Cardiovascular:  RRR, no RG. SEM.  No peripheral edema Lungs:  Even/non-labored on [place] O2, lungs bilaterally diminished  Abdomen:  Soft, non-tender, non-distended Musculoskeletal:  No acute deformity Skin:  Grossly intact   Recent Labs Lab 10/03/14 0541 10/04/14 0340 10/04/14 2150 10/05/14 0330  NA 140 132*  --  135  K 5.8* 5.3*  --  4.6  CL 105 94*  --  96*  CO2 30 30  --  29  BUN 54* 57*  --  53*  CREATININE 1.46* 1.26*  --  1.39*  GLUCOSE 89 60* 74 103*    Recent Labs Lab 10/02/14 1922 10/02/14 2045 10/02/14 2146 10/02/14 2349  HGB 11.5* 11.2* 10.5* 10.9*  HCT 36.7 33.0*  31.0* 35.1*  WBC 12.3*  --   --  10.8*  PLT 229  --   --  225   No results found.  ASSESSMENT / PLAN:  Acute on chronic hypercarbic/hypoxemic respiratory failure Acute on chronic diastolic CHF Pulmonary Edema COPD without acute exacerbation Elevated R hemidiaphragm with Chiladitis Syndrome  81 year old female admitted with a 1 day history of SOB with non-productive cough. Does not appear overtly volume overloaded on exam, however, CXR consistent with pulmonary edema pattern. Has history of exacerbations in recent past and improved with diuresis, BiPAP.  - D/C BiPAP - Wean FiO2 to keep SpO2 > 90% - Discontinue foley - Bronchodilators to nebs while on intermittent BiPAP - Repeat ABG only if mental status changes - Intermittent CXR  - Hold further lasix 8/26, monitor sr cr closely  Hyperkalemia - Management per primary team - Holding supplementation  Hypoglycemia  -Holding levemir & amaryl, defer to primary   GLOBAL:  Patient appears clinically much better this am.  O2 weaned down to 2L, more alert.  Sat up in chair yesterday.  Appreciate Palliative Care assistance.  Even with clinical improvement, it is still appropriate to continue conversation about EOL issues given overall medical condition and advanced age.  Agree with transfer to medical floor.     PCCM will sign off. Please  call back if new needs arise.     Canary Brim, NP-C [facility] Pgr: [phone] or if no answer [phone] 10/06/2014, 9:09 AM   Attending:  I have seen and examined the patient with nurse practitioner/resident and agree with the note above.   Ms. Hemmingway looks great today after diuresing yesterday.  ON exam, no JVD, no S3, still in Afib, lungs clear, breathing comfortably  CHF exacerbation> improved with diuresis, will start daily oral lasix, will defer to [facility] to adjust dose prior to discharge  Palliative recs appreciated  PCCM off  Heber [place], MD [place]  PCCM Pager: [phone] Cell: [phone] After 3pm or if no response, call [phone]

## 2014-10-06 NOTE — Progress Notes (Signed)
Results for KYNDAHL, JABLON (MRN [id number]) as of 10/06/2014 12:25  Ref. Range 10/05/2014 12:27 10/05/2014 16:11 10/05/2014 21:36 10/06/2014 07:50  Glucose-Capillary Latest Ref Range: 65-99 mg/dL 086 (H) 578 (H) 469 (H) 143 (H)   Post-prandial blood sugars elevated. May benefit from meal coverage insulin.  Consider addition of Novolog 3 units tidwc.  Will follow. Thank you. Ailene Ards, RD, LDN, CDE Inpatient Diabetes Coordinator [phone]

## 2014-10-07 LAB — GLUCOSE, CAPILLARY
GLUCOSE-CAPILLARY: 143 mg/dL — AB (ref 65–99)
GLUCOSE-CAPILLARY: 204 mg/dL — AB (ref 65–99)
Glucose-Capillary: 362 mg/dL — ABNORMAL HIGH (ref 65–99)
Glucose-Capillary: 109 mg/dL — ABNORMAL HIGH (ref 65–99)
Glucose-Capillary: 48 mg/dL — ABNORMAL LOW (ref 65–99)
Glucose-Capillary: 146 mg/dL — ABNORMAL HIGH (ref 65–99)

## 2014-10-07 LAB — BLOOD GAS, ARTERIAL
Acid-Base Excess: 8 mmol/L — ABNORMAL HIGH (ref 0.0–2.0)
BICARBONATE: 36.5 meq/L — AB (ref 20.0–24.0)
DRAWN BY: 257701
O2 CONTENT: 4 L/min
O2 Saturation: 91.1 %
PCO2 ART: 69.9 mmHg — AB (ref 35.0–45.0)
PH ART: 7.338 — AB (ref 7.350–7.450)
Patient temperature: 98.6
TCO2: 32.2 mmol/L (ref 0–100)
pO2, Arterial: 65.1 mmHg — ABNORMAL LOW (ref 80.0–100.0)

## 2014-10-07 LAB — BASIC METABOLIC PANEL
Anion gap: 7 (ref 5–15)
BUN: 58 mg/dL — AB (ref 6–20)
CALCIUM: 9.7 mg/dL (ref 8.9–10.3)
CO2: 39 mmol/L — ABNORMAL HIGH (ref 22–32)
CREATININE: 1.22 mg/dL — AB (ref 0.44–1.00)
Chloride: 94 mmol/L — ABNORMAL LOW (ref 101–111)
GFR calc non Af Amer: 40 mL/min — ABNORMAL LOW (ref >60)
GFR, EST AFRICAN AMERICAN: 46 mL/min — AB (ref >60)
Glucose, Bld: 134 mg/dL — ABNORMAL HIGH (ref 65–99)
Potassium: 4.2 mmol/L (ref 3.5–5.1)
SODIUM: 140 mmol/L (ref 135–145)

## 2014-10-07 LAB — CBC
HCT: 32.3 % — ABNORMAL LOW (ref 36.0–46.0)
Hemoglobin: 10.4 g/dL — ABNORMAL LOW (ref 12.0–15.0)
MCH: 33.1 pg (ref 26.0–34.0)
MCHC: 32.2 g/dL (ref 30.0–36.0)
MCV: 102.9 fL — ABNORMAL HIGH (ref 78.0–100.0)
Platelets: 268 10*3/uL (ref 150–400)
RBC: 3.14 MIL/uL — ABNORMAL LOW (ref 3.87–5.11)
RDW: 16.9 % — AB (ref 11.5–15.5)
WBC: 8.7 10*3/uL (ref 4.0–10.5)

## 2014-10-07 MED ORDER — DEXTROSE 50 % IV SOLN
25.0000 mL | Freq: Once | INTRAVENOUS | Status: AC
  Administered 2014-10-07: 25 mL via INTRAVENOUS

## 2014-10-07 MED ORDER — DEXTROSE 50 % IV SOLN
INTRAVENOUS | Status: AC
  Administered 2014-10-07: 25 mL via INTRAVENOUS
  Filled 2014-10-07: qty 50

## 2014-10-07 NOTE — Progress Notes (Signed)
PROGRESS NOTE  Michelle Bowers ZOX:[id number] DOB: 13-Nov-1930 DOA: 10/02/2014 PCP: Julian Hy, MD  HPI/Recap of past 3 hours: 81 year old female with past history of diastolic CHF, diabetes mellitus with stage III chronic kidney disease and COPD admitted on 8/22 with acute respiratory failure requiring BiPAP. On admission, patient noted to be in pulmonary edema. Patient started on Lasix and transferred to ICU.  Critical care consulted.    Over the next few days, patient has had slow improvement. Able to be weaned off of BiPAP by 8/24 and down to oxygen by facemask, but required BiPAP intermittently. Since 8/25, patient has had significant improvement and was been able to maintain oxygenation on 2 L nasal cannula alone.  Patient transferred to floor. At some point, increase to 4 L. Noted to be more somnolent all day, so blood gas checked and patient found to have hypercarbia acutely with increase in bicarbonate. PCO2 at 70. Order written to transfer patient to stepdown for BiPAP. Patient herself unable to wake up for me    Assessment/Plan: Principal Problem:   Acute respiratory failure with hypercapnia/hypoxia secondary to acute diastolic CHF and chronic COPD: Finished diuresing. With acute hypercarbia, we'll transfer patient to stepdown and start BiPAP   We talked about the possibilities that should the patient worsen or if she would want to be on a breathing machine long-term and she does not think so, but she is not sure midline discussed with her daughter. Long-term, I'm concerned about her making it through his hospitalization and have asked palliative care for consultation for goals of care Active Problems:   Essential hypertension: Stable continue on home medications    GERD   Hyperkalemia: Presented with potassium of 7.6. Treated with D50 and albuterol the emergency room plus Kayexalate. With diuresis, now stable   Diabetes type 2, uncontrolled with episodes of hypoglycemia: D5  added yesterday which resulted in significant hyperglycemia today the patient is eating. Have discontinued IV fluids and put her on better sliding-scale coverage, continue to monitor. Had episode of markedly hyper glycemia this afternoon. Have added some low-dose scheduled NovoLog    CKD (chronic kidney disease) stage 3, GFR 30-59 ml/min: Stable, continue to monitor   Code Status: Full code  Family Communication: Left message with family   Disposition Plan: unfortunately, likely setback for this patient. We'll be here for several more days   Antibiotics:  None   Objective: BP 154/56 mmHg  Pulse 88  Temp(Src) 97.5 F (36.4 C) (Oral)  Resp 16  Ht 5\' 3"  (1.6 m)  Wt 51.9 kg (114 lb 6.7 oz)  BMI 20.27 kg/m2  SpO2 100%  Intake/Output Summary (Last 24 hours) at 10/07/14 1358 Last data filed at 10/07/14 0804  Gross per 24 hour  Intake      0 ml  Output      0 ml  Net      0 ml   Filed Weights   10/05/14 0536 10/06/14 0500 10/07/14 0636  Weight: 52.5 kg (115 lb 11.9 oz) 51.2 kg (112 lb 14 oz) 51.9 kg (114 lb 6.7 oz)    Exam:   General:  difficult to awaken   Cardiovascular: regular rhythm, 2/6 systolic ejection murmur, borderline tachycardia   Respiratory:  decreased breath sounds throughout   Abdomen: Soft, nondistended, hypoactive bowel sounds   Musculoskeletal: no clubbing or cyanosis, trace pitting edema bilaterally    Data Reviewed: Basic Metabolic Panel:  Recent Labs Lab 10/03/14 0541 10/04/14 0340 10/04/14 2150 10/05/14 0330 10/06/14  1610 10/07/14 0548  NA 140 132*  --  135 139 140  K 5.8* 5.3*  --  4.6 4.5 4.2  CL 105 94*  --  96* 94* 94*  CO2 30 30  --  29 33* 39*  GLUCOSE 89 60* 74 103* 202* 134*  BUN 54* 57*  --  53* 60* 58*  CREATININE 1.46* 1.26*  --  1.39* 1.27* 1.22*  CALCIUM 10.3 9.3  --  9.1 9.8 9.7   Liver Function Tests: No results for input(s): AST, ALT, ALKPHOS, BILITOT, PROT, ALBUMIN in the last 168 hours. No results for input(s):  LIPASE, AMYLASE in the last 168 hours. No results for input(s): AMMONIA in the last 168 hours. CBC:  Recent Labs Lab 10/02/14 1922 10/02/14 2045 10/02/14 2146 10/02/14 2349 10/06/14 0950 10/07/14 0548  WBC 12.3*  --   --  10.8* 7.2 8.7  HGB 11.5* 11.2* 10.5* 10.9* 10.8* 10.4*  HCT 36.7 33.0* 31.0* 35.1* 33.0* 32.3*  MCV 102.8*  --   --  103.8* 102.2* 102.9*  PLT 229  --   --  225 259 268   Cardiac Enzymes:    Recent Labs Lab 10/02/14 2349 10/03/14 0541 10/03/14 1137  TROPONINI 0.03 0.03 0.03   BNP (last 3 results)  Recent Labs  09/03/14 0309 10/02/14 1928  BNP 377.8* 479.9*    ProBNP (last 3 results) No results for input(s): PROBNP in the last 8760 hours.  CBG:  Recent Labs Lab 10/06/14 1642 10/06/14 1707 10/06/14 2106 10/07/14 0724 10/07/14 1153  GLUCAP 401* 312* 118* 143* 204*    Recent Results (from the past 240 hour(s))  Urine culture     Status: None   Collection Time: 10/03/14 12:45 AM  Result Value Ref Range Status   Specimen Description URINE, CATHETERIZED  Final   Special Requests NONE  Final   Culture   Final    30,000 COLONIES/mL YEAST Performed at [facility]    Report Status 10/05/2014 FINAL  Final  MRSA PCR Screening     Status: None   Collection Time: 10/03/14 12:45 AM  Result Value Ref Range Status   MRSA by PCR NEGATIVE NEGATIVE Final    Comment:        The GeneXpert MRSA Assay (FDA approved for NASAL specimens only), is one component of a comprehensive MRSA colonization surveillance program. It is not intended to diagnose MRSA infection nor to guide or monitor treatment for MRSA infections.      Studies: No results found.  Scheduled Meds: . amLODipine  10 mg Oral Daily  . antiseptic oral rinse  7 mL Mouth Rinse q12n4p  . aspirin EC  325 mg Oral Daily  . atorvastatin  10 mg Oral q1800  . chlorhexidine  15 mL Mouth Rinse BID  . cholecalciferol  1,000 Units Oral BID  . cyanocobalamin  500 mcg Oral Daily    . docusate sodium  100 mg Oral BID  . DULoxetine  60 mg Oral Daily  . feeding supplement (PRO-STAT SUGAR FREE 64)  30 mL Oral Daily  . ferrous sulfate  325 mg Oral TID  . fluticasone  2 spray Each Nare BID  . furosemide  40 mg Oral Daily  . heparin  5,000 Units Subcutaneous 3 times per day  . hydrALAZINE  100 mg Oral TID  . insulin aspart  0-15 Units Subcutaneous TID WC  . insulin aspart  0-5 Units Subcutaneous QHS  . insulin aspart  3 Units Subcutaneous TID  WC  . insulin glargine  5 Units Subcutaneous QHS  . omega-3 acid ethyl esters  1 g Oral Daily  . pantoprazole  40 mg Oral Daily  . polyvinyl alcohol  1 drop Both Eyes BID  . senna-docusate  1 tablet Oral BID  . sodium chloride  3 mL Intravenous Q12H    Continuous Infusions:    Time spent: 25 minutes  Hollice Espy  [facility] Pager [phone]. If 7PM-7AM, please contact night-coverage at [email or web address], password [facility] 10/07/2014, 1:58 PM  LOS: 5 days

## 2014-10-07 NOTE — Progress Notes (Signed)
Daily Progress Note   Patient Name: Michelle Bowers       Date: 10/07/2014 DOB: 04/25/1930  Age: 81 y.o. MRN#: [id number] Attending Physician: Hollice Espy, MD Primary Care Physician: Julian Hy, MD Admit Date: 10/02/2014  Life limiting illness: 81 year old female with past history of diastolic CHF, diabetes mellitus with stage III chronic kidney disease and COPD admitted on 8/22 with acute respiratory failure requiring BiPAP. On admission, patient noted to be in pulmonary edema. Reason for Consultation/Follow-up: Establishing goals of care  Subjective:  resting in bed, somnolent this am but does open eyes. In no distress.  Interval Events: Call placed and discussed with daughter Nadeen Landau. She is encouraged that the patient's work of breathing has some what stabilized or actually improved. Patient's other daughter Harriett Sine is currently out of the country in [place]. Rosey Bath states she has attempted to have code status discussions with the patient, but the patient would like to wait until her other daughter Harriett Sine arrives back. Discussed with Rosey Bath about the patient's current medical condition as well as her underlying chronic irreversible illnesses. All questions answered.   PLAN: Patient ok to go to SNF when deemed medically stable.  Patient and family to continue conversations about wishes and code status etc.  Length of Stay: 5 days  Current Medications: Scheduled Meds:  . amLODipine  10 mg Oral Daily  . antiseptic oral rinse  7 mL Mouth Rinse q12n4p  . aspirin EC  325 mg Oral Daily  . atorvastatin  10 mg Oral q1800  . chlorhexidine  15 mL Mouth Rinse BID  . cholecalciferol  1,000 Units Oral BID  . cyanocobalamin  500 mcg Oral Daily  . docusate sodium  100 mg Oral BID  . DULoxetine  60 mg Oral Daily  . feeding supplement (PRO-STAT SUGAR FREE 64)  30 mL Oral Daily  . ferrous sulfate  325 mg Oral TID  . fluticasone  2 spray Each Nare BID  . furosemide  40 mg  Oral Daily  . heparin  5,000 Units Subcutaneous 3 times per day  . hydrALAZINE  100 mg Oral TID  . insulin aspart  0-15 Units Subcutaneous TID WC  . insulin aspart  0-5 Units Subcutaneous QHS  . insulin aspart  3 Units Subcutaneous TID WC  . insulin glargine  5 Units Subcutaneous QHS  . omega-3 acid ethyl esters  1 g Oral Daily  . pantoprazole  40 mg Oral Daily  . polyvinyl alcohol  1 drop Both Eyes BID  . senna-docusate  1 tablet Oral BID  . sodium chloride  3 mL Intravenous Q12H    Continuous Infusions:    PRN Meds: sodium chloride, acetaminophen, ALPRAZolam, guaiFENesin, ipratropium-albuterol, loratadine, ondansetron (ZOFRAN) IV, sodium chloride, traMADol  Palliative Performance Scale: 30%     Vital Signs: BP 148/45 mmHg  Pulse 88  Temp(Src) 97.5 F (36.4 C) (Oral)  Resp 22  Ht  (1.6 m)  Wt 51.9 kg (114 lb 6.7 oz)  BMI 20.27 kg/m2  SpO2 100% SpO2: SpO2: 100 % O2 Device: O2 Device: Nasal Cannula O2 Flow Rate: O2 Flow Rate (L/min): 4 L/min  Intake/output summary:  Intake/Output Summary (Last 24 hours) at 10/07/14 1208 Last data filed at 10/07/14 0804  Gross per 24 hour  Intake      0 ml  Output      0 ml  Net      0 ml   LBM:   Baseline Weight: Weight: 52.4 kg (  115 lb 8.3 oz) Most recent weight: Weight: 51.9 kg (114 lb 6.7 oz)  Physical Exam: Pale weak appearing resting in bed  NAD Clear S1 S2 No edema Awakens easily             Additional Data Reviewed: Recent Labs     10/06/14  0950  10/07/14  0548  WBC  7.2  8.7  HGB  10.8*  10.4*  PLT  259  268  NA  139  140  BUN  60*  58*  CREATININE  1.27*  1.22*     Problem List:  Patient Active Problem List   Diagnosis Date Noted  . SOB (shortness of breath)   . Encounter for palliative care   . CKD (chronic kidney disease) stage 3, GFR 30-59 ml/min 10/04/2014  . Acute respiratory failure with hypoxemia   . Absolute anemia   . Acute diastolic CHF (congestive heart failure)   .  Hypomagnesemia   . Acute ischemic stroke   . Tobacco abuse   . External carotid artery stenosis   . Acute pulmonary edema   . Aspiration pneumonia due to food (regurgitated)   . Altered mental status   . Pulmonary edema   . HCAP (healthcare-associated pneumonia)   . Hypokalemia   . Diabetes type 2, uncontrolled   . HLD (hyperlipidemia)   . Anxiety state   . Stroke   . Pneumobilia 09/03/2014  . Respiratory failure 09/03/2014  . Obtunded   . Sepsis due to pneumonia   . Meconium aspiration pneumonia   . Acute respiratory failure with hypercapnia   . Hyperkalemia   . Type 2 diabetes mellitus with complication   . Acute on chronic renal failure   . Acute embolic stroke   . Chronic arterial ischemic stroke   . DM 10/16/2009  . COLITIS 10/16/2009  . ANEMIA 08/08/2009  . CEREBROVASCULAR ACCIDENT 08/08/2009  . COPD (chronic obstructive pulmonary disease) 08/08/2009  . PULMONARY EDEMA 08/08/2009  . DEGENERATIVE JOINT DISEASE 08/08/2009  . ADRENAL MASS, LEFT 03/26/2007  . HYPERLIPIDEMIA 03/26/2007  . Essential hypertension 03/26/2007  . ESOPHAGEAL STRICTURE 03/26/2007  . GERD 03/26/2007  . DUODENITIS, WITHOUT HEMORRHAGE 03/26/2007  . ANGIODYSPLASIA OF INTESTINE 03/26/2007  . OSTEOPOROSIS 03/26/2007  . ARTERIOVENOUS MALFORMATION, CECUM 03/26/2007  . ANEMIA, HX OF 03/26/2007     Palliative Care Assessment & Plan    Code Status:  Full code  Goals of Care:   as above   Desire for further Chaplaincy support:no  3. Symptom Management:   no acute needs   4. Palliative Prophylaxis:  Stool Softener: yes  5. Prognosis: likely weeks to months  5. Discharge Planning: SNF   Care plan was discussed with  Patient, daughter Nadeen Landau over the phone   Thank you for allowing the Palliative Medicine Team to assist in the care of this patient.   Time In: 0900 Time out 0925 Total Time 25 Prolonged Time Billed  no     Greater than 50%  of this time was spent  counseling and coordinating care related to the above assessment and plan.  Katieann Hungate Gwenlyn Saran, MD  10/07/2014, 12:08 PM  [phone]  Please contact Palliative Medicine Team phone at [phone] for questions and concerns.

## 2014-10-07 NOTE — Progress Notes (Signed)
RT ensured RN aware (at approximately 1400) of BiPAP order and need for transfer to [facility] / ICU.

## 2014-10-08 DIAGNOSIS — I5032 Chronic diastolic (congestive) heart failure: Secondary | ICD-10-CM

## 2014-10-08 DIAGNOSIS — E1165 Type 2 diabetes mellitus with hyperglycemia: Secondary | ICD-10-CM

## 2014-10-08 LAB — CBC
HEMATOCRIT: 31.9 % — AB (ref 36.0–46.0)
Hemoglobin: 10.1 g/dL — ABNORMAL LOW (ref 12.0–15.0)
MCH: 32.8 pg (ref 26.0–34.0)
MCHC: 31.7 g/dL (ref 30.0–36.0)
MCV: 103.6 fL — AB (ref 78.0–100.0)
PLATELETS: 252 10*3/uL (ref 150–400)
RBC: 3.08 MIL/uL — AB (ref 3.87–5.11)
RDW: 16.9 % — ABNORMAL HIGH (ref 11.5–15.5)
WBC: 7.3 10*3/uL (ref 4.0–10.5)

## 2014-10-08 LAB — BLOOD GAS, ARTERIAL
ACID-BASE EXCESS: 9.2 mmol/L — AB (ref 0.0–2.0)
BICARBONATE: 35.4 meq/L — AB (ref 20.0–24.0)
DELIVERY SYSTEMS: POSITIVE
Drawn by: 437071
EXPIRATORY PAP: 6
FIO2: 0.3
INSPIRATORY PAP: 14
LHR: 14 {breaths}/min
MODE: POSITIVE
O2 Saturation: 96 %
Patient temperature: 97.5
TCO2: 33.1 mmol/L (ref 0–100)
pCO2 arterial: 59 mmHg (ref 35.0–45.0)
pH, Arterial: 7.391 (ref 7.350–7.450)
pO2, Arterial: 78.7 mmHg — ABNORMAL LOW (ref 80.0–100.0)

## 2014-10-08 LAB — GLUCOSE, CAPILLARY
GLUCOSE-CAPILLARY: 412 mg/dL — AB (ref 65–99)
Glucose-Capillary: 198 mg/dL — ABNORMAL HIGH (ref 65–99)
Glucose-Capillary: 301 mg/dL — ABNORMAL HIGH (ref 65–99)
Glucose-Capillary: 332 mg/dL — ABNORMAL HIGH (ref 65–99)

## 2014-10-08 LAB — BASIC METABOLIC PANEL
ANION GAP: 7 (ref 5–15)
BUN: 62 mg/dL — ABNORMAL HIGH (ref 6–20)
CALCIUM: 9.7 mg/dL (ref 8.9–10.3)
CO2: 36 mmol/L — ABNORMAL HIGH (ref 22–32)
Chloride: 95 mmol/L — ABNORMAL LOW (ref 101–111)
Creatinine, Ser: 1.35 mg/dL — ABNORMAL HIGH (ref 0.44–1.00)
GFR, EST AFRICAN AMERICAN: 41 mL/min — AB (ref >60)
GFR, EST NON AFRICAN AMERICAN: 35 mL/min — AB (ref >60)
GLUCOSE: 142 mg/dL — AB (ref 65–99)
POTASSIUM: 4.1 mmol/L (ref 3.5–5.1)
Sodium: 138 mmol/L (ref 135–145)

## 2014-10-08 NOTE — Care Management Important Message (Signed)
Important Message  Patient Details  Name: Michelle Bowers MRN: [id number] Date of Birth: 02-22-1930   Medicare Important Message Given:  Yes-third notification given    Antony Haste, RN 10/08/2014, 1:46 PM

## 2014-10-08 NOTE — Progress Notes (Signed)
Bipap order from ICU/step-down. Pt transferred to floor. Last worn Bipap 10/08/14 AM for increased CO2

## 2014-10-08 NOTE — Progress Notes (Signed)
PROGRESS NOTE  Michelle Bowers ZOX:[id number] DOB: 1930-08-23 DOA: 10/02/2014 PCP: Julian Hy, MD  HPI/Recap of past 48 hours: 81 year old female with past history of diastolic CHF, diabetes mellitus with stage III chronic kidney disease and COPD admitted on 8/22 with acute respiratory failure requiring BiPAP. On admission, patient noted to be in pulmonary edema. Patient started on Lasix and transferred to ICU.  Critical care consulted.    Over the next few days, patient has had slow improvement. Able to be weaned off of BiPAP by 8/24 and down to oxygen by facemask, but required BiPAP intermittently. Since 8/25, patient has had significant improvement and was been able to maintain oxygenation on 2 L nasal cannula alone.  Patient transferred to floor. At some point, oxygen requirements necessitated increased  to 4 L. Noted to be more somnolent all day 8/27, so blood gas checked and patient found to have hypercarbia acutely with increase in bicarbonate. PCO2 at 70, requiring patient to be started on BiPAP and transferred back to stepdown.  This morning, PCO2 back to near baseline at 59. Patient awake and alert and interactive. No complaints although still focal like her breathing is rough. Currently she is on 2 L nasal cannula.  Assessment/Plan: Principal Problem:   Acute respiratory failure with hypercapnia/hypoxia secondary to acute diastolic CHF and chronic COPD: Finished diuresing. With acute hypercarbia, she required BiPAP again. We will continue to watch her and if we can keep her just at 2 L stable, can move her back to the floor and potential discharge soon back to skilled nursing  We talked about the possibilities that should the patient worsen or if she would want to be on a breathing machine long-term and she does not think so, but she is not sure midline discussed with her daughter. Long-term, I'm concerned her back and forth aggressive efforts to optimize her breathing  Essential hypertension: Stable continue on home medications    GERD   Hyperkalemia: Presented with potassium of 7.6. Treated with D50 and albuterol the emergency room plus Kayexalate. With diuresis, now stable    Diabetes type 2, uncontrolled with episodes of hypoglycemia: D5 added yesterday which resulted in significant hyperglycemia today the patient is eating. Have discontinued IV fluids and put her on better sliding-scale coverage, continue to monitor. Had episode of markedly hyper glycemia this afternoon. Have added some low-dose scheduled NovoLog    CKD (chronic kidney disease) stage 3, GFR 30-59 ml/min: Stable, continue to monitor   Code Status: Full code  Family Communication: Left message with family   Disposition Plan: If she is able to maintain her oxygenation on 2 L, likely transfer to floor later today. Potential discharge back to skilled nursing in the next 1-2 days.   Antibiotics:  None   Objective: BP 142/39 mmHg  Pulse 79  Temp(Src) 98.3 F (36.8 C) (Oral)  Resp 23  Ht  (1.6 m)  Wt 51.6 kg (113 lb 12.1 oz)  BMI 20.16 kg/m2  SpO2 98%  Intake/Output Summary (Last 24 hours) at 10/08/14 1043 Last data filed at 10/08/14 0800  Gross per 24 hour  Intake    180 ml  Output      0 ml  Net    180 ml   Filed Weights   10/07/14 0636 10/07/14 1523 10/08/14 0500  Weight: 51.9 kg (114 lb 6.7 oz) 50.7 kg (111 lb 12.4 oz) 51.6 kg (113 lb 12.1 oz)    Exam:   General:  Alert and oriented  2, no acute distress  Cardiovascular: regular rhythm, 2/6 systolic ejection murmur, borderline tachycardia   Respiratory:  decreased breath sounds throughout   Abdomen: Soft, nondistended, hypoactive bowel sounds   Musculoskeletal: no clubbing or cyanosis, trace pitting edema bilaterally    Data Reviewed: Basic Metabolic Panel:  Recent Labs Lab 10/04/14 0340 10/04/14 2150 10/05/14 0330 10/06/14 0950 10/07/14 0548 10/08/14 0341  NA 132*  --  135 139 140 138  K  5.3*  --  4.6 4.5 4.2 4.1  CL 94*  --  96* 94* 94* 95*  CO2 30  --  29 33* 39* 36*  GLUCOSE 60* 74 103* 202* 134* 142*  BUN 57*  --  53* 60* 58* 62*  CREATININE 1.26*  --  1.39* 1.27* 1.22* 1.35*  CALCIUM 9.3  --  9.1 9.8 9.7 9.7   Liver Function Tests: No results for input(s): AST, ALT, ALKPHOS, BILITOT, PROT, ALBUMIN in the last 168 hours. No results for input(s): LIPASE, AMYLASE in the last 168 hours. No results for input(s): AMMONIA in the last 168 hours. CBC:  Recent Labs Lab 10/02/14 1922  10/02/14 2146 10/02/14 2349 10/06/14 0950 10/07/14 0548 10/08/14 0341  WBC 12.3*  --   --  10.8* 7.2 8.7 7.3  HGB 11.5*  < > 10.5* 10.9* 10.8* 10.4* 10.1*  HCT 36.7  < > 31.0* 35.1* 33.0* 32.3* 31.9*  MCV 102.8*  --   --  103.8* 102.2* 102.9* 103.6*  PLT 229  --   --  225 259 268 252  < > = values in this interval not displayed. Cardiac Enzymes:    Recent Labs Lab 10/02/14 2349 10/03/14 0541 10/03/14 1137  TROPONINI 0.03 0.03 0.03   BNP (last 3 results)  Recent Labs  09/03/14 0309 10/02/14 1928  BNP 377.8* 479.9*    ProBNP (last 3 results) No results for input(s): PROBNP in the last 8760 hours.  CBG:  Recent Labs Lab 10/07/14 1604 10/07/14 1955 10/07/14 2153 10/07/14 2239 10/08/14 0749  GLUCAP 362* 109* 48* 146* 198*    Recent Results (from the past 240 hour(s))  Urine culture     Status: None   Collection Time: 10/03/14 12:45 AM  Result Value Ref Range Status   Specimen Description URINE, CATHETERIZED  Final   Special Requests NONE  Final   Culture   Final    30,000 COLONIES/mL YEAST Performed at [facility]    Report Status 10/05/2014 FINAL  Final  MRSA PCR Screening     Status: None   Collection Time: 10/03/14 12:45 AM  Result Value Ref Range Status   MRSA by PCR NEGATIVE NEGATIVE Final    Comment:        The GeneXpert MRSA Assay (FDA approved for NASAL specimens only), is one component of a comprehensive MRSA  colonization surveillance program. It is not intended to diagnose MRSA infection nor to guide or monitor treatment for MRSA infections.      Studies: No results found.  Scheduled Meds: . amLODipine  10 mg Oral Daily  . antiseptic oral rinse  7 mL Mouth Rinse q12n4p  . aspirin EC  325 mg Oral Daily  . atorvastatin  10 mg Oral q1800  . chlorhexidine  15 mL Mouth Rinse BID  . cholecalciferol  1,000 Units Oral BID  . cyanocobalamin  500 mcg Oral Daily  . docusate sodium  100 mg Oral BID  . DULoxetine  60 mg Oral Daily  . feeding supplement (PRO-STAT SUGAR FREE  64)  30 mL Oral Daily  . ferrous sulfate  325 mg Oral TID  . fluticasone  2 spray Each Nare BID  . furosemide  40 mg Oral Daily  . heparin  5,000 Units Subcutaneous 3 times per day  . hydrALAZINE  100 mg Oral TID  . insulin aspart  0-15 Units Subcutaneous TID WC  . insulin aspart  0-5 Units Subcutaneous QHS  . insulin aspart  3 Units Subcutaneous TID WC  . insulin glargine  5 Units Subcutaneous QHS  . omega-3 acid ethyl esters  1 g Oral Daily  . pantoprazole  40 mg Oral Daily  . polyvinyl alcohol  1 drop Both Eyes BID  . senna-docusate  1 tablet Oral BID  . sodium chloride  3 mL Intravenous Q12H    Continuous Infusions:    Time spent: 25 minutes  Hollice Espy  [facility] Pager [phone]. If 7PM-7AM, please contact night-coverage at [email or web address], password [facility] 10/08/2014, 10:43 AM  LOS: 6 days

## 2014-10-09 LAB — BLOOD GAS, ARTERIAL
ACID-BASE EXCESS: 10.9 mmol/L — AB (ref 0.0–2.0)
Bicarbonate: 37.1 mEq/L — ABNORMAL HIGH (ref 20.0–24.0)
DELIVERY SYSTEMS: POSITIVE
DRAWN BY: 11249
EXPIRATORY PAP: 6
FIO2: 0.3
INSPIRATORY PAP: 14
MODE: POSITIVE
O2 SAT: 97.4 %
PH ART: 7.409 (ref 7.350–7.450)
Patient temperature: 97.9
RATE: 8 resp/min
TCO2: 34.3 mmol/L (ref 0–100)
pCO2 arterial: 59.5 mmHg (ref 35.0–45.0)
pO2, Arterial: 91.6 mmHg (ref 80.0–100.0)

## 2014-10-09 LAB — GLUCOSE, CAPILLARY
GLUCOSE-CAPILLARY: 162 mg/dL — AB (ref 65–99)
Glucose-Capillary: 282 mg/dL — ABNORMAL HIGH (ref 65–99)

## 2014-10-09 LAB — BASIC METABOLIC PANEL
Anion gap: 11 (ref 5–15)
BUN: 63 mg/dL — ABNORMAL HIGH (ref 6–20)
CO2: 32 mmol/L (ref 22–32)
Calcium: 9.5 mg/dL (ref 8.9–10.3)
Chloride: 89 mmol/L — ABNORMAL LOW (ref 101–111)
Creatinine, Ser: 1.18 mg/dL — ABNORMAL HIGH (ref 0.44–1.00)
GFR calc Af Amer: 48 mL/min — ABNORMAL LOW (ref >60)
GFR calc non Af Amer: 42 mL/min — ABNORMAL LOW (ref >60)
Glucose, Bld: 353 mg/dL — ABNORMAL HIGH (ref 65–99)
Potassium: 4.7 mmol/L (ref 3.5–5.1)
Sodium: 132 mmol/L — ABNORMAL LOW (ref 135–145)

## 2014-10-09 LAB — GLUCOSE, RANDOM: GLUCOSE: 350 mg/dL — AB (ref 65–99)

## 2014-10-09 MED ORDER — INSULIN DETEMIR 100 UNIT/ML ~~LOC~~ SOLN
7.0000 [IU] | Freq: Two times a day (BID) | SUBCUTANEOUS | Status: DC
  Administered 2014-10-09: 7 [IU] via SUBCUTANEOUS
  Filled 2014-10-09 (×2): qty 0.07

## 2014-10-09 MED ORDER — INSULIN DETEMIR 100 UNIT/ML ~~LOC~~ SOLN: 8.0000 [IU] | Freq: Two times a day (BID) | SUBCUTANEOUS | Status: AC

## 2014-10-09 MED ORDER — FUROSEMIDE 40 MG PO TABS: 40.0000 mg | ORAL_TABLET | Freq: Two times a day (BID) | ORAL | Status: AC

## 2014-10-09 MED ORDER — INSULIN ASPART 100 UNIT/ML ~~LOC~~ SOLN
5.0000 [IU] | Freq: Once | SUBCUTANEOUS | Status: AC
  Administered 2014-10-09: 5 [IU] via SUBCUTANEOUS

## 2014-10-09 NOTE — Discharge Summary (Addendum)
Discharge Summary  Haw[place]i Siebels VWU:[id number] DOB: 1930-07-27  PCP: Julian Hy, MD  Admit date: 10/02/2014 Discharge date: 10/09/2014  Time spent: 25 minutes  Recommendations for Outpatient Follow-up:  1. Medication change: Amaryl discontinued 2. Medication change: Lasix changed to 40 mg by mouth twice a day 3. Medication change: Levemir changed to 8 units twice a day   Discharge Diagnoses:  Active Hospital Problems   Diagnosis Date Noted  . Acute respiratory failure with hypercapnia   . SOB (shortness of breath)   . Encounter for palliative care   . CKD (chronic kidney disease) stage 3, GFR 30-59 ml/min 10/04/2014  . Acute respiratory failure with hypoxemia   . Acute diastolic CHF (congestive heart failure)   . Acute pulmonary edema   . Diabetes type 2, uncontrolled   . HLD (hyperlipidemia)   . Hyperkalemia   . COPD (chronic obstructive pulmonary disease) 08/08/2009  . Essential hypertension 03/26/2007  . GERD 03/26/2007    Resolved Hospital Problems   Diagnosis Date Noted Date Resolved  No resolved problems to display.    Discharge Condition: Improved, being discharged back to skilled nursing facility  Diet recommendation: Carb modified, heart healthy  Filed Weights   10/08/14 0500 10/08/14 1737 10/09/14 0630  Weight: 51.6 kg (113 lb 12.1 oz) 52.1 kg (114 lb 13.8 oz) 51.3 kg (113 lb 1.5 oz)    History of present illness:  81 year old female with past history of diastolic CHF, diabetes mellitus with stage III chronic kidney disease and COPD admitted on 8/22 with acute respiratory failure requiring BiPAP. On admission, patient noted to be in pulmonary edema. Patient started on Lasix and transferred to ICU. Critical care consulted.   Hospital Course:  Principal Problem:   Acute on chronic respiratory failure with hypercapnia/hypoxia secondary to acute diastolic CHF with underlying chronic COPD: She was started on IV Lasix. Echocardiogram done in July  of this year noted grade 1 diastolic dysfunction. Patient had slow improvement. By 8/24, oxygen and able to be weaned down requiring intermittent BiPAP only. By 8/25 she was down to 2 L and transferred to floor. He required additional oxygen which was increased to 4 L which led to hypercarbic retention and patient became more somnolent heart episode of BiPAP. An ABG at that time noted PCO2 70, but following BiPAP was down to near baseline 59. Patient has been on nasal cannula at 2 L for over 48 hours. She is felt medically stable for discharge.   Palliative medicine was consulted given patient's advanced age and difficulty with her respiratory progression. While her daughter and the patient talked it over, no decisions were made and patient remained full code  Active Problems:    Essential hypertension: Stable continue on home medications    GERD: Stable continued on PPI     Hyperkalemia: Patient initially presented with potassium 7.6. This was treated with D50 and albuterol in the emergency room plus Kayexalate. Following diuresis, since been stable now for a number of days.    Diabetes type 2, uncontrolled with episodes of hyper and hypoglycemia: Patient has had intermittent issues secondary to either not being able to eat or under covering to account for hypoglycemia. Followed by diabetes educator. Her overall medications have been adjusted with discontinuing her Amaryl and putting her on Levemir 8 units twice a day as well as continuing her on her home dose of Humalog 5 units 3 times a day with meals.      CKD (chronic kidney disease) stage 3,  GFR 30-59 ml/min: Stable medical issues during this hospitalization.    SOB (shortness of breath)   Encounter for palliative care   Procedures:  None  Consultations:  Critical care  Palliative medicine  Discharge Exam: BP 152/46 mmHg  Pulse 81  Temp(Src) 98.1 F (36.7 C) (Oral)  Resp 24  Ht 5\' 3"  (1.6 m)  Wt 51.3 kg (113 lb 1.5 oz)  BMI  20.04 kg/m2  SpO2 95%  General:  alert and oriented 2, no acute distress  Cardiovascular: Regular rate and rhythm, S1-S2  Respiratory: decreased breath sounds throughout   Discharge Instructions You were cared for by a hospitalist during your hospital stay. If you have any questions about your discharge medications or the care you received while you were in the hospital after you are discharged, you can call the unit and asked to speak with the hospitalist on call if the hospitalist that took care of you is not available. Once you are discharged, your primary care physician will handle any further medical issues. Please note that NO REFILLS for any discharge medications will be authorized once you are discharged, as it is imperative that you return to your primary care physician (or establish a relationship with a primary care physician if you do not have one) for your aftercare needs so that they can reassess your need for medications and monitor your lab values.  Discharge Instructions    Diet - low sodium heart healthy    Complete by:  As directed      Increase activity slowly    Complete by:  As directed             Medication List    STOP taking these medications        glimepiride 2 MG tablet  Commonly known as:  AMARYL     insulin lispro 100 UNIT/ML injection  Commonly known as:  HUMALOG     LORazepam 0.5 MG tablet  Commonly known as:  ATIVAN      TAKE these medications        albuterol 108 (90 BASE) MCG/ACT inhaler  Commonly known as:  PROVENTIL HFA;VENTOLIN HFA  Inhale 2 puffs into the lungs every 6 (six) hours as needed for shortness of breath.     ALPRAZolam 0.25 MG tablet  Commonly known as:  XANAX  Take 0.25 mg by mouth 2 (two) times daily as needed for anxiety.     amLODipine 10 MG tablet  Commonly known as:  NORVASC  Take 10 mg by mouth daily.     atorvastatin 10 MG tablet  Commonly known as:  LIPITOR  Take 10 mg by mouth daily.     CHLORASEPTIC 1.4  % Liqd  Generic drug:  phenol  Use as directed 1 spray in the mouth or throat every 2 (two) hours as needed for throat irritation / pain.     Cholecalciferol 1000 UNITS capsule  Take 1,000 Units by mouth 2 (two) times daily.     docusate sodium 100 MG capsule  Commonly known as:  COLACE  Take 100 mg by mouth 2 (two) times daily.     DULoxetine 60 MG capsule  Commonly known as:  CYMBALTA  Take 60 mg by mouth daily.     feeding supplement (ENSURE ENLIVE) Liqd  Take 237 mLs by mouth 2 (two) times daily between meals.     feeding supplement (PRO-STAT SUGAR FREE 64) Liqd  Take 30 mLs by mouth daily.  ferrous sulfate 325 (65 FE) MG tablet  Take 325 mg by mouth 3 (three) times daily.     Fish Oil 1000 MG Caps  Take 1 capsule by mouth 2 (two) times daily.     fluticasone 50 MCG/ACT nasal spray  Commonly known as:  FLONASE  Place 2 sprays into both nostrils 2 (two) times daily.     furosemide 40 MG tablet  Commonly known as:  LASIX  Take 1 tablet (40 mg total) by mouth 2 (two) times daily.     guaifenesin 100 MG/5ML syrup  Commonly known as:  ROBITUSSIN  Take 200 mg by mouth every 4 (four) hours as needed for cough.     hydrALAZINE 100 MG tablet  Commonly known as:  APRESOLINE  Take 100 mg by mouth 3 (three) times daily.     insulin aspart 100 UNIT/ML FlexPen  Commonly known as:  NOVOLOG FLEXPEN  Inject 5 Units into the skin 3 (three) times daily with meals.     insulin detemir 100 UNIT/ML injection  Commonly known as:  LEVEMIR  Inject 0.08 mLs (8 Units total) into the skin 2 (two) times daily.     Insulin Pen Needle 29G X Misc  Commonly known as:  AURORA PEN NEEDLES  5 Units by Does not apply route every morning.     ipratropium-albuterol 0.5-2.5 (3) MG/3ML Soln  Commonly known as:  DUONEB  Take 3 mLs by nebulization every 6 (six) hours as needed. For shortness of breath     loratadine 10 MG tablet  Commonly known as:  CLARITIN  Take 10 mg by mouth daily  as needed for allergies.     omeprazole 20 MG capsule  Commonly known as:  PRILOSEC  Take 20 mg by mouth daily.     potassium chloride 10 MEQ CR capsule  Commonly known as:  MICRO-K  Take 20 mEq by mouth daily.     sennosides-docusate sodium 8.6-50 MG tablet  Commonly known as:  SENOKOT-S  Take 1 tablet by mouth 2 (two) times daily.     SYSTANE BALANCE 0.6 % Soln  Generic drug:  Propylene Glycol  Place 1 drop into both eyes 2 (two) times daily.     traMADol 50 MG tablet  Commonly known as:  ULTRAM  Take 50 mg by mouth 2 (two) times daily as needed for moderate pain.     vitamin B-12 500 MCG tablet  Commonly known as:  CYANOCOBALAMIN  Take 500 mcg by mouth daily.       Allergies  Allergen Reactions  . Codeine     unknown  . Doxazosin Mesylate     unknown  . Gemfibrozil     unknown  . Rosiglitazone Maleate     REACTION: sensitivity      The results of significant diagnostics from this hospitalization (including imaging, microbiology, ancillary and laboratory) are listed below for reference.    Significant Diagnostic Studies: Dg Chest Port 1 View  10/04/2014   CLINICAL DATA:  Respiratory failure.  EXAM: PORTABLE CHEST - 1 VIEW  COMPARISON:  10/03/2014.  08/07/2014 .  FINDINGS: Mediastinum hilar structures stable. Heart size stable. Mitral annular calcification. Persistent low lung volumes with basilar atelectasis. Progressive bilateral pulmonary interstitial prominence. Small bilateral pleural effusions. Congestive heart failure could present this fashion. Bilateral pneumonitis cannot be excluded. Calcified right breast implant.  IMPRESSION: 1. Progressive bilateral pulmonary edema. Small bilateral pleural effusions. Findings consistent with congestive heart failure.  2.  Low lung  volumes with basilar atelectasis.   Electronically Signed   By: Maisie Fus  Register   On: 10/04/2014 07:10   Dg Chest Port 1 View  10/03/2014   CLINICAL DATA:  Pulmonary edema.  Shortness of  breath  EXAM: PORTABLE CHEST - 1 VIEW  COMPARISON:  10/02/2014  FINDINGS: Cardiopericardial enlargement which is stable. Mitral annular calcification is bulky. Stable aortic and hilar contours.  Bibasilar lung opacities persist despite better lung volumes. Diffuse interstitial coarsening is improved.  IMPRESSION: 1. Mild improvement in pulmonary edema. 2. Improved lung volumes but persistent bibasilar atelectasis or pneumonia.   Electronically Signed   By: Marnee Spring M.D.   On: 10/03/2014 08:41   Dg Chest Port 1 View  10/02/2014   CLINICAL DATA:  Severe shortness of breath for 1 day, history CHF, COPD, diabetes mellitus, hypertension, stroke  EXAM: PORTABLE CHEST - 1 VIEW  COMPARISON:  Portable exam 2001 hours compared to 09/12/2014  FINDINGS: Enlargement of cardiac silhouette.  Atherosclerotic calcification aorta.  Chronic elevation of RIGHT diaphragm with bowel interposition.  Capsular calcification at BILATERAL breast prostheses.  Pulmonary vascular congestion with scattered interstitial infiltrates likely representing pulmonary edema and CHF.  No gross pleural effusion or pneumothorax.  Bones demineralized.  IMPRESSION: Probable CHF.   Electronically Signed   By: Ulyses Southward M.D.   On: 10/02/2014 20:50   Dg Chest Port 1 View  09/12/2014   CLINICAL DATA:  Pleural effusion  EXAM: PORTABLE CHEST - 1 VIEW  COMPARISON:  Chest x-ray from 2 days prior  FINDINGS: Left upper extremity PICC, tip at the upper right atrium, stable from previous.  Lung volumes remain low.  Unchanged cardiomegaly with bulky mitral annular ossification. As before, the trachea is deviated to the right, likely from thyroid nodule based on 2008 chest CT.  Improving lower lung aeration with the diaphragm better seen. No drainable pleural effusion. No pulmonary edema.  Calcified breast implants. Prominent stool again seen in the right upper quadrant.  IMPRESSION: Persistent hypoventilation but clearing lower lobe opacities.    Electronically Signed   By: Marnee Spring M.D.   On: 09/12/2014 07:17   Dg Chest Port 1 View  09/10/2014   CLINICAL DATA:  Pneumonia follow-up  EXAM: PORTABLE CHEST - 1 VIEW  COMPARISON:  09/04/2014 and prior radiographs  FINDINGS: Low volume film again noted.  Bilateral lower lung atelectasis/ opacities/effusions and pulmonary vascular congestion again noted.  A left PICC line is present with tip overlying the superior cavoatrial junction.  There is no evidence of pneumothorax.  IMPRESSION: Little significant change with bilateral lower lung opacities/ atelectasis/effusions and pulmonary vascular congestion.   Electronically Signed   By: Harmon Pier M.D.   On: 09/10/2014 07:59    Microbiology: Recent Results (from the past 240 hour(s))  Urine culture     Status: None   Collection Time: 10/03/14 12:45 AM  Result Value Ref Range Status   Specimen Description URINE, CATHETERIZED  Final   Special Requests NONE  Final   Culture   Final    30,000 COLONIES/mL YEAST Performed at [facility]    Report Status 10/05/2014 FINAL  Final  MRSA PCR Screening     Status: None   Collection Time: 10/03/14 12:45 AM  Result Value Ref Range Status   MRSA by PCR NEGATIVE NEGATIVE Final    Comment:        The GeneXpert MRSA Assay (FDA approved for NASAL specimens only), is one component of a comprehensive MRSA colonization  surveillance program. It is not intended to diagnose MRSA infection nor to guide or monitor treatment for MRSA infections.      Labs: Basic Metabolic Panel:  Recent Labs Lab 10/05/14 0330 10/06/14 0950 10/07/14 0548 10/08/14 0341 10/08/14 2350  NA 135 139 140 138 132*  K 4.6 4.5 4.2 4.1 4.7  CL 96* 94* 94* 95* 89*  CO2 29 33* 39* 36* 32  GLUCOSE 103* 202* 134* 142* 353*  350*  BUN 53* 60* 58* 62* 63*  CREATININE 1.39* 1.27* 1.22* 1.35* 1.18*  CALCIUM 9.1 9.8 9.7 9.7 9.5   Liver Function Tests: No results for input(s): AST, ALT, ALKPHOS, BILITOT, PROT,  ALBUMIN in the last 168 hours. No results for input(s): LIPASE, AMYLASE in the last 168 hours. No results for input(s): AMMONIA in the last 168 hours. CBC:  Recent Labs Lab 10/02/14 1922  10/02/14 2146 10/02/14 2349 10/06/14 0950 10/07/14 0548 10/08/14 0341  WBC 12.3*  --   --  10.8* 7.2 8.7 7.3  HGB 11.5*  < > 10.5* 10.9* 10.8* 10.4* 10.1*  HCT 36.7  < > 31.0* 35.1* 33.0* 32.3* 31.9*  MCV 102.8*  --   --  103.8* 102.2* 102.9* 103.6*  PLT 229  --   --  225 259 268 252  < > = values in this interval not displayed. Cardiac Enzymes:  Recent Labs Lab 10/02/14 2349 10/03/14 0541 10/03/14 1137  TROPONINI 0.03 0.03 0.03   BNP: BNP (last 3 results)  Recent Labs  09/03/14 0309 10/02/14 1928  BNP 377.8* 479.9*    ProBNP (last 3 results) No results for input(s): PROBNP in the last 8760 hours.  CBG:  Recent Labs Lab 10/08/14 0749 10/08/14 1203 10/08/14 1540 10/08/14 2239 10/09/14 0752  GLUCAP 198* 301* 332* 412* 162*       Signed:  Byran Bilotti K  [facility] 10/09/2014, 12:29 PM

## 2014-10-09 NOTE — Care Management Note (Signed)
Case Management Note  Patient Details  Name: Michelle Bowers MRN: [id number] Date of Birth: Jul 10, 1930  Subjective/Objective:                    Action/Plan:d/c snf   Expected Discharge Date:   (UNKNOWN)               Expected Discharge Plan:  Skilled Nursing Facility  In-House Referral:  Clinical Social Work  Discharge planning Services  CM Consult  Post Acute Care Choice:  NA Choice offered to:  NA  DME Arranged:  N/A DME Agency:  NA  HH Arranged:  NA HH Agency:  NA  Status of Service:  Completed, signed off  Medicare Important Message Given:  Yes-third notification given Date Medicare IM Given:    Medicare IM give by:    Date Additional Medicare IM Given:    Additional Medicare Important Message give by:     If discussed at Long Length of Stay Meetings, dates discussed:    Additional Comments:  Lanier Clam, RN 10/09/2014, 12:53 PM

## 2014-10-09 NOTE — Progress Notes (Signed)
Inpatient Diabetes Program Recommendations  AACE/ADA: New Consensus Statement on Inpatient Glycemic Control (2013)  Target Ranges:  Prepandial:   less than 140 mg/dL      Peak postprandial:   less than 180 mg/dL (1-2 hours)      Critically ill patients:  140 - 180 mg/dL   Reason for Visit: Hyperglycemia  Results for Michelle Bowers, Michelle Bowers (MRN [id number]) as of 10/09/2014 10:28  Ref. Range 10/08/2014 07:49 10/08/2014 12:03 10/08/2014 15:40 10/08/2014 22:39 10/09/2014 07:52  Glucose-Capillary Latest Ref Range: 65-99 mg/dL 409 (H) 811 (H) 914 (H) 412 (H) 162 (H)   Post-prandial blood sugars continue to be elevated.  Consider increasing Novolog to 5 units tidwc (home dose) If FBS > 180 mg/dL, increase Levemir to 8 units bid.  Will continue to follow. Thank you. Ailene Ards, RD, LDN, CDE Inpatient Diabetes Coordinator [phone]

## 2014-10-09 NOTE — Progress Notes (Signed)
Patient is set to discharge back to [facility] today. Patient & daughter, Rosey Bath aware. Discharge packet given to RN, Dois Davenport. [facility] called for transport to pickup at 1:45pm.     Lincoln Maxin, LCSW [facility] Clinical Social Worker cell #: [phone]

## 2014-10-09 NOTE — Progress Notes (Signed)
CBG at bedtime was 412.  Notified NP on call who ordered BMET.  Glucose on BMET was 353.  Five units of Novolog given per orders.  Will continue to monitor patient.  Manson Passey, Makina Skow [place]

## 2014-11-29 ENCOUNTER — Emergency Department (HOSPITAL_COMMUNITY): Payer: Medicare Other

## 2014-11-29 ENCOUNTER — Inpatient Hospital Stay (HOSPITAL_COMMUNITY)
Admission: EM | Admit: 2014-11-29 | Discharge: 2014-12-12 | DRG: 682 | Disposition: E | Payer: Medicare Other | Attending: Internal Medicine | Admitting: Internal Medicine

## 2014-11-29 DIAGNOSIS — I1310 Hypertensive heart and chronic kidney disease without heart failure, with stage 1 through stage 4 chronic kidney disease, or unspecified chronic kidney disease: Secondary | ICD-10-CM | POA: Diagnosis present

## 2014-11-29 DIAGNOSIS — Z87891 Personal history of nicotine dependence: Secondary | ICD-10-CM | POA: Diagnosis not present

## 2014-11-29 DIAGNOSIS — R5383 Other fatigue: Secondary | ICD-10-CM | POA: Diagnosis present

## 2014-11-29 DIAGNOSIS — J9611 Chronic respiratory failure with hypoxia: Secondary | ICD-10-CM | POA: Diagnosis not present

## 2014-11-29 DIAGNOSIS — E1122 Type 2 diabetes mellitus with diabetic chronic kidney disease: Secondary | ICD-10-CM | POA: Diagnosis present

## 2014-11-29 DIAGNOSIS — F329 Major depressive disorder, single episode, unspecified: Secondary | ICD-10-CM | POA: Diagnosis present

## 2014-11-29 DIAGNOSIS — Z9981 Dependence on supplemental oxygen: Secondary | ICD-10-CM

## 2014-11-29 DIAGNOSIS — H9190 Unspecified hearing loss, unspecified ear: Secondary | ICD-10-CM | POA: Diagnosis present

## 2014-11-29 DIAGNOSIS — Z8673 Personal history of transient ischemic attack (TIA), and cerebral infarction without residual deficits: Secondary | ICD-10-CM

## 2014-11-29 DIAGNOSIS — E785 Hyperlipidemia, unspecified: Secondary | ICD-10-CM | POA: Diagnosis present

## 2014-11-29 DIAGNOSIS — E872 Acidosis: Secondary | ICD-10-CM | POA: Diagnosis present

## 2014-11-29 DIAGNOSIS — B9620 Unspecified Escherichia coli [E. coli] as the cause of diseases classified elsewhere: Secondary | ICD-10-CM | POA: Diagnosis present

## 2014-11-29 DIAGNOSIS — T68XXXA Hypothermia, initial encounter: Secondary | ICD-10-CM | POA: Insufficient documentation

## 2014-11-29 DIAGNOSIS — F419 Anxiety disorder, unspecified: Secondary | ICD-10-CM | POA: Diagnosis present

## 2014-11-29 DIAGNOSIS — N183 Chronic kidney disease, stage 3 unspecified: Secondary | ICD-10-CM | POA: Diagnosis present

## 2014-11-29 DIAGNOSIS — E119 Type 2 diabetes mellitus without complications: Secondary | ICD-10-CM

## 2014-11-29 DIAGNOSIS — Z792 Long term (current) use of antibiotics: Secondary | ICD-10-CM

## 2014-11-29 DIAGNOSIS — Z794 Long term (current) use of insulin: Secondary | ICD-10-CM | POA: Diagnosis not present

## 2014-11-29 DIAGNOSIS — E860 Dehydration: Secondary | ICD-10-CM | POA: Diagnosis present

## 2014-11-29 DIAGNOSIS — J9621 Acute and chronic respiratory failure with hypoxia: Secondary | ICD-10-CM | POA: Diagnosis present

## 2014-11-29 DIAGNOSIS — G9341 Metabolic encephalopathy: Secondary | ICD-10-CM | POA: Diagnosis present

## 2014-11-29 DIAGNOSIS — Z515 Encounter for palliative care: Secondary | ICD-10-CM | POA: Diagnosis not present

## 2014-11-29 DIAGNOSIS — N179 Acute kidney failure, unspecified: Secondary | ICD-10-CM | POA: Diagnosis present

## 2014-11-29 DIAGNOSIS — Z79899 Other long term (current) drug therapy: Secondary | ICD-10-CM

## 2014-11-29 DIAGNOSIS — D649 Anemia, unspecified: Secondary | ICD-10-CM | POA: Diagnosis present

## 2014-11-29 DIAGNOSIS — I10 Essential (primary) hypertension: Secondary | ICD-10-CM | POA: Diagnosis present

## 2014-11-29 DIAGNOSIS — K219 Gastro-esophageal reflux disease without esophagitis: Secondary | ICD-10-CM | POA: Diagnosis present

## 2014-11-29 DIAGNOSIS — R0600 Dyspnea, unspecified: Secondary | ICD-10-CM

## 2014-11-29 DIAGNOSIS — J438 Other emphysema: Secondary | ICD-10-CM | POA: Diagnosis not present

## 2014-11-29 DIAGNOSIS — J449 Chronic obstructive pulmonary disease, unspecified: Secondary | ICD-10-CM | POA: Diagnosis present

## 2014-11-29 DIAGNOSIS — N390 Urinary tract infection, site not specified: Secondary | ICD-10-CM | POA: Diagnosis present

## 2014-11-29 DIAGNOSIS — I421 Obstructive hypertrophic cardiomyopathy: Secondary | ICD-10-CM | POA: Diagnosis present

## 2014-11-29 DIAGNOSIS — H6120 Impacted cerumen, unspecified ear: Secondary | ICD-10-CM | POA: Diagnosis present

## 2014-11-29 DIAGNOSIS — I5032 Chronic diastolic (congestive) heart failure: Secondary | ICD-10-CM | POA: Diagnosis present

## 2014-11-29 DIAGNOSIS — I69351 Hemiplegia and hemiparesis following cerebral infarction affecting right dominant side: Secondary | ICD-10-CM | POA: Diagnosis not present

## 2014-11-29 DIAGNOSIS — Z66 Do not resuscitate: Secondary | ICD-10-CM

## 2014-11-29 DIAGNOSIS — R4182 Altered mental status, unspecified: Secondary | ICD-10-CM | POA: Diagnosis present

## 2014-11-29 LAB — I-STAT TROPONIN, ED: Troponin i, poc: 0.08 ng/mL (ref 0.00–0.08)

## 2014-11-29 LAB — CBC WITH DIFFERENTIAL/PLATELET
BASOS ABS: 0.1 10*3/uL (ref 0.0–0.1)
BASOS PCT: 1 %
EOS ABS: 0.2 10*3/uL (ref 0.0–0.7)
EOS PCT: 2 %
HEMATOCRIT: 41.1 % (ref 36.0–46.0)
Hemoglobin: 13 g/dL (ref 12.0–15.0)
Lymphocytes Relative: 11 %
Lymphs Abs: 1 10*3/uL (ref 0.7–4.0)
MCH: 32.6 pg (ref 26.0–34.0)
MCHC: 31.6 g/dL (ref 30.0–36.0)
MCV: 103 fL — ABNORMAL HIGH (ref 78.0–100.0)
MONO ABS: 0.6 10*3/uL (ref 0.1–1.0)
MONOS PCT: 7 %
NEUTROS ABS: 7.2 10*3/uL (ref 1.7–7.7)
Neutrophils Relative %: 79 %
PLATELETS: 246 10*3/uL (ref 150–400)
RBC: 3.99 MIL/uL (ref 3.87–5.11)
RDW: 13.8 % (ref 11.5–15.5)
WBC: 9 10*3/uL (ref 4.0–10.5)

## 2014-11-29 LAB — URINALYSIS, ROUTINE W REFLEX MICROSCOPIC
Bilirubin Urine: NEGATIVE
Glucose, UA: NEGATIVE mg/dL
Hgb urine dipstick: NEGATIVE
Ketones, ur: NEGATIVE mg/dL
LEUKOCYTES UA: NEGATIVE
NITRITE: POSITIVE — AB
PH: 5 (ref 5.0–8.0)
Protein, ur: NEGATIVE mg/dL
SPECIFIC GRAVITY, URINE: 1.014 (ref 1.005–1.030)
Urobilinogen, UA: 0.2 mg/dL (ref 0.0–1.0)

## 2014-11-29 LAB — COMPREHENSIVE METABOLIC PANEL
ALBUMIN: 3.3 g/dL — AB (ref 3.5–5.0)
ALK PHOS: 75 U/L (ref 38–126)
ALT: 12 U/L — ABNORMAL LOW (ref 14–54)
ANION GAP: 12 (ref 5–15)
AST: 15 U/L (ref 15–41)
BUN: 64 mg/dL — ABNORMAL HIGH (ref 6–20)
CALCIUM: 10.2 mg/dL (ref 8.9–10.3)
CO2: 34 mmol/L — AB (ref 22–32)
Chloride: 93 mmol/L — ABNORMAL LOW (ref 101–111)
Creatinine, Ser: 1.54 mg/dL — ABNORMAL HIGH (ref 0.44–1.00)
GFR calc Af Amer: 35 mL/min — ABNORMAL LOW (ref >60)
GFR calc non Af Amer: 30 mL/min — ABNORMAL LOW (ref >60)
GLUCOSE: 211 mg/dL — AB (ref 65–99)
POTASSIUM: 4.5 mmol/L (ref 3.5–5.1)
SODIUM: 139 mmol/L (ref 135–145)
Total Bilirubin: 0.5 mg/dL (ref 0.3–1.2)
Total Protein: 6.8 g/dL (ref 6.5–8.1)

## 2014-11-29 LAB — I-STAT CHEM 8, ED
BUN: 61 mg/dL — AB (ref 6–20)
CREATININE: 1.6 mg/dL — AB (ref 0.44–1.00)
Calcium, Ion: 1.25 mmol/L (ref 1.13–1.30)
Chloride: 96 mmol/L — ABNORMAL LOW (ref 101–111)
Glucose, Bld: 217 mg/dL — ABNORMAL HIGH (ref 65–99)
HEMATOCRIT: 44 % (ref 36.0–46.0)
Hemoglobin: 15 g/dL (ref 12.0–15.0)
POTASSIUM: 4.3 mmol/L (ref 3.5–5.1)
Sodium: 140 mmol/L (ref 135–145)
TCO2: 35 mmol/L (ref 0–100)

## 2014-11-29 LAB — I-STAT CG4 LACTIC ACID, ED: Lactic Acid, Venous: 0.87 mmol/L (ref 0.5–2.0)

## 2014-11-29 LAB — I-STAT ARTERIAL BLOOD GAS, ED
ACID-BASE EXCESS: 13 mmol/L — AB (ref 0.0–2.0)
BICARBONATE: 40.3 meq/L — AB (ref 20.0–24.0)
O2 Saturation: 92 %
PO2 ART: 70 mmHg — AB (ref 80.0–100.0)
TCO2: 42 mmol/L (ref 0–100)
pCO2 arterial: 71.1 mmHg (ref 35.0–45.0)
pH, Arterial: 7.362 (ref 7.350–7.450)

## 2014-11-29 LAB — URINE MICROSCOPIC-ADD ON

## 2014-11-29 LAB — GLUCOSE, CAPILLARY
GLUCOSE-CAPILLARY: 196 mg/dL — AB (ref 65–99)
Glucose-Capillary: 203 mg/dL — ABNORMAL HIGH (ref 65–99)

## 2014-11-29 LAB — CBG MONITORING, ED: GLUCOSE-CAPILLARY: 216 mg/dL — AB (ref 65–99)

## 2014-11-29 LAB — PROTIME-INR
INR: 0.96 (ref 0.00–1.49)
Prothrombin Time: 13 seconds (ref 11.6–15.2)

## 2014-11-29 LAB — AMMONIA: AMMONIA: 32 umol/L (ref 9–35)

## 2014-11-29 MED ORDER — ALBUTEROL SULFATE HFA 108 (90 BASE) MCG/ACT IN AERS: 2.0000 | INHALATION_SPRAY | Freq: Four times a day (QID) | RESPIRATORY_TRACT | Status: DC | PRN

## 2014-11-29 MED ORDER — HYDRALAZINE HCL 50 MG PO TABS
100.0000 mg | ORAL_TABLET | Freq: Three times a day (TID) | ORAL | Status: DC
  Filled 2014-11-29 (×3): qty 2

## 2014-11-29 MED ORDER — SODIUM CHLORIDE 0.9 % IV SOLN
INTRAVENOUS | Status: AC
  Administered 2014-11-29: 18:00:00 via INTRAVENOUS

## 2014-11-29 MED ORDER — INSULIN ASPART 100 UNIT/ML ~~LOC~~ SOLN: 0.0000 [IU] | SUBCUTANEOUS | Status: DC

## 2014-11-29 MED ORDER — ATORVASTATIN CALCIUM 10 MG PO TABS
10.0000 mg | ORAL_TABLET | Freq: Every day | ORAL | Status: DC
  Filled 2014-11-29 (×3): qty 1

## 2014-11-29 MED ORDER — ENOXAPARIN SODIUM 30 MG/0.3ML ~~LOC~~ SOLN
30.0000 mg | SUBCUTANEOUS | Status: DC
  Administered 2014-11-29 – 2014-11-30 (×2): 30 mg via SUBCUTANEOUS
  Filled 2014-11-29 (×2): qty 0.3

## 2014-11-29 MED ORDER — SODIUM CHLORIDE 0.9 % IJ SOLN
3.0000 mL | Freq: Two times a day (BID) | INTRAMUSCULAR | Status: DC
  Administered 2014-11-30 – 2014-12-03 (×5): 3 mL via INTRAVENOUS

## 2014-11-29 MED ORDER — INSULIN ASPART 100 UNIT/ML ~~LOC~~ SOLN: 0.0000 [IU] | Freq: Three times a day (TID) | SUBCUTANEOUS | Status: DC

## 2014-11-29 MED ORDER — IPRATROPIUM-ALBUTEROL 0.5-2.5 (3) MG/3ML IN SOLN
3.0000 mL | Freq: Four times a day (QID) | RESPIRATORY_TRACT | Status: DC | PRN
  Administered 2014-11-30: 3 mL via RESPIRATORY_TRACT
  Filled 2014-11-29: qty 3

## 2014-11-29 MED ORDER — ALPRAZOLAM 0.25 MG PO TABS: 0.2500 mg | ORAL_TABLET | Freq: Two times a day (BID) | ORAL | Status: DC | PRN

## 2014-11-29 MED ORDER — CARBAMIDE PEROXIDE 6.5 % OT SOLN
5.0000 [drp] | Freq: Every day | OTIC | Status: DC
  Administered 2014-11-29 – 2014-12-02 (×4): 5 [drp] via OTIC
  Filled 2014-11-29 (×2): qty 15

## 2014-11-29 MED ORDER — SODIUM CHLORIDE 0.9 % IV BOLUS (SEPSIS)
500.0000 mL | Freq: Once | INTRAVENOUS | Status: AC
  Administered 2014-11-29: 500 mL via INTRAVENOUS

## 2014-11-29 MED ORDER — DULOXETINE HCL 60 MG PO CPEP
60.0000 mg | ORAL_CAPSULE | Freq: Every day | ORAL | Status: DC
  Filled 2014-11-29 (×2): qty 1

## 2014-11-29 MED ORDER — CEFTRIAXONE SODIUM 1 G IJ SOLR
1.0000 g | INTRAMUSCULAR | Status: DC
  Administered 2014-11-29 – 2014-12-03 (×5): 1 g via INTRAVENOUS
  Filled 2014-11-29 (×7): qty 10

## 2014-11-29 MED ORDER — MIRTAZAPINE 15 MG PO TABS
7.5000 mg | ORAL_TABLET | Freq: Every day | ORAL | Status: DC
  Filled 2014-11-29: qty 1

## 2014-11-29 MED ORDER — INSULIN ASPART 100 UNIT/ML ~~LOC~~ SOLN: 0.0000 [IU] | Freq: Every day | SUBCUTANEOUS | Status: DC

## 2014-11-29 MED ORDER — INSULIN ASPART 100 UNIT/ML ~~LOC~~ SOLN
0.0000 [IU] | Freq: Three times a day (TID) | SUBCUTANEOUS | Status: DC
  Administered 2014-11-30: 5 [IU] via SUBCUTANEOUS
  Administered 2014-11-30: 3 [IU] via SUBCUTANEOUS

## 2014-11-29 MED ORDER — AMLODIPINE BESYLATE 10 MG PO TABS
10.0000 mg | ORAL_TABLET | Freq: Every day | ORAL | Status: DC
Start: 2014-11-30 — End: 2014-12-01
  Filled 2014-11-29 (×2): qty 1

## 2014-11-29 NOTE — Progress Notes (Signed)
Pt received from ED at 17:40pm alert with no noted distress on 2L of O2 via n/c.Pt stable, neuro intact.  Denis pain or discomfort.  Daughters at bedside. Pt placed on telemetry.  Pt oriented to room. Safety measures in place. Call bell within reach.

## 2014-11-29 NOTE — H&P (Signed)
[facility] History and Physical                                                                                    Automatic Data, is a 81 y.o. female  MRN: [id number]   DOB - 15-Nov-1930  Admit Date - 12/03/2014  Outpatient Primary MD for the patient is Julian Hy, MD  Referring MD: Effie Shy / ER   With History of -  Past Medical History  Diagnosis Date  . Depression   . Hypercholesteremia   . Hypertension   . Diabetes mellitus without complication   . COPD (chronic obstructive pulmonary disease)   . Renal disorder   . Chronic kidney disease   . CHF (congestive heart failure)   . Stroke     right deficits      Past Surgical History  Procedure Laterality Date  . Abdominal hysterectomy      partial    in for   Chief Complaint  Patient presents with  . Altered Mental Status     HPI This is an 81 year old female patient with a history of chronic hypoxemia and COPD on 4 liters oxygen at baseline, HOCM with chronic recurrent pulmonary edema, chronic kidney disease, history of chronic microvascular ischemia, history of left external capsule and periventricular white matter stroke in 2010, diabetes on insulin, hypertension, anxiety and depression, dyslipidemia who was sent to the ER from skilled nursing facility secondary to altered mentation. Patient apparently was nonverbal at presentation but was able to convey to the ER staff that she was having headache and chest pain but no abdominal pain.  Upon arrival to the ER the patient was hypothermic with a rectal temperature of 96.3 prompting placement of warming blanket, patient was hypertensive with a blood pressure 179/65, heart rate was stable at 85 and she was maintaining sinus rhythm, she was on 2 L nasal cannula oxygen with sats 99%. Chest x-ray showed stable mild cardiomegaly and mild pulmonary edema with a stable small right pleural effusion and mild right basilar atelectasis. CT of the head was unremarkable.  Electrolyte panel was normal except for chloride 96, BUN 61, creatinine 1.6 and elevated CO2 of 34. Glucose was slightly elevated to 17, LFTs were normal, troponin was normal, lactic acid was normal. CBC was normal except for elevated MCV of 103. PT/INR was normal. Urinalysis was abnormal and concerning for UTI with hazy appearance, many bacteria, hyaline casts, positive nitrite, 0-2 WBCs. Blood cultures and urine cultures were obtained in the ER. Because of presentation with hypothermia and altered mentation with apparent infectious source i.e. UTI patient was treated as possible sepsis despite normal lactic acid. She was given IV antibiotics (Rocephin) and because of her history of obstructive cardiomyopathy with rapid transition to heart failure she has been given small volume fluid challenges totaling 1 L since arrival. With the above treatments patient's mentation has improved noting she now awakens and attempts to verbally communicate noting this is limited by her hard of hearing status. Unfortunately she continues to drift off to sleep easily and remains tachypnea.  In talking with the daughters at the bedside, the patient's physician at the nursing facility had  started her on Levaquin for presumed pneumonia/ possible UTI on 10/14. She also was being treated with Debrox for cerumen impaction.  Since my initial discussion with the patient's family my attending has evaluated the patient and all the family has decided to make the patient a DO NOT RESUSCITATE.   Review of Systems   In addition to the HPI above,  No Fever-chills No Headache, changes with Vision or hearing, new weakness, tingling, numbness in any extremity, No problems swallowing food or Liquids, indigestion/reflux No Chest pain, Cough or Shortness of Breath, palpitations, orthopnea or DOE No N/V; no melena or hematochezia, no dark tarry stools No dysuria, hematuria or flank pain No new skin rashes, lesions, masses or bruises, No  new joints pains-aches No recent weight gain or loss No polyuria, polydypsia or polyphagia,  *A full 10 point Review of Systems was done, except as stated above, all other Review of Systems were negative.  Social History Social History  Substance Use Topics  . Smoking status: Former Smoker    Quit date: 07/06/2008  . Smokeless tobacco: Never Used  . Alcohol Use: No    Resides at: [facility] skilled [facility] facility  Lives with: N/A  Ambulatory status: With assistive devices   Family History Reviewed with daughter and no family history that would pertain to current admission status   Prior to Admission medications   Medication Sig Start Date End Date Taking? Authorizing Provider  albuterol (PROVENTIL HFA;VENTOLIN HFA) 108 (90 BASE) MCG/ACT inhaler Inhale 2 puffs into the lungs every 6 (six) hours as needed for shortness of breath.   Yes Historical Provider, MD  ALPRAZolam (XANAX) 0.25 MG tablet Take 0.25 mg by mouth 2 (two) times daily as needed for anxiety.   Yes Historical Provider, MD  Amino Acids-Protein Hydrolys (FEEDING SUPPLEMENT, PRO-STAT SUGAR FREE 64,) LIQD Take 30 mLs by mouth 2 (two) times daily.    Yes Historical Provider, MD  amLODipine (NORVASC) 10 MG tablet Take 10 mg by mouth daily.   Yes Historical Provider, MD  atorvastatin (LIPITOR) 10 MG tablet Take 10 mg by mouth daily.   Yes Historical Provider, MD  carbamide peroxide (DEBROX) 6.5 % otic solution Place 5 drops into both ears at bedtime.   Yes Historical Provider, MD  Cholecalciferol 1000 UNITS capsule Take 1,000 Units by mouth 2 (two) times daily.   Yes Historical Provider, MD  docusate sodium (COLACE) 100 MG capsule Take 100 mg by mouth 2 (two) times daily.   Yes Historical Provider, MD  DULoxetine (CYMBALTA) 60 MG capsule Take 60 mg by mouth daily.   Yes Historical Provider, MD  ferrous sulfate 325 (65 FE) MG tablet Take 325 mg by mouth 3 (three) times daily.   Yes Historical Provider, MD  fluticasone  (FLONASE) 50 MCG/ACT nasal spray Place 2 sprays into both nostrils 2 (two) times daily.    Yes Historical Provider, MD  furosemide (LASIX) 40 MG tablet Take 1 tablet (40 mg total) by mouth 2 (two) times daily. 10/09/14  Yes Hollice Espy, MD  guaifenesin (ROBITUSSIN) 100 MG/5ML syrup Take 200 mg by mouth every 4 (four) hours as needed for cough.   Yes Historical Provider, MD  hydrALAZINE (APRESOLINE) 100 MG tablet Take 100 mg by mouth 3 (three) times daily.   Yes Historical Provider, MD  insulin aspart (NOVOLOG FLEXPEN) 100 UNIT/ML FlexPen Inject 5 Units into the skin 3 (three) times daily with meals. 09/12/14  Yes Drema Dallas, MD  insulin detemir (LEVEMIR) 100 UNIT/ML  injection Inject 0.08 mLs (8 Units total) into the skin 2 (two) times daily. Patient taking differently: Inject 14 Units into the skin 2 (two) times daily.  10/09/14  Yes Hollice Espy, MD  Insulin Pen Needle (AURORA PEN NEEDLES) 29G X MISC 5 Units by Does not apply route every morning. 09/12/14  Yes Drema Dallas, MD  ipratropium-albuterol (DUONEB) 0.5-2.5 (3) MG/3ML SOLN Take 3 mLs by nebulization every 6 (six) hours as needed. For shortness of breath   Yes Historical Provider, MD  levofloxacin (LEVAQUIN) 500 MG tablet Take 500 mg by mouth daily.   Yes Historical Provider, MD  loratadine (CLARITIN) 10 MG tablet Take 10 mg by mouth daily as needed for allergies.   Yes Historical Provider, MD  mirtazapine (REMERON) 7.5 MG tablet Take 7.5 mg by mouth at bedtime.   Yes Historical Provider, MD  Multiple Vitamins-Minerals (DECUBI-VITE) CAPS Take 1 capsule by mouth daily.   Yes Historical Provider, MD  Omega-3 Fatty Acids (FISH OIL) 1000 MG CAPS Take 1 capsule by mouth 2 (two) times daily.   Yes Historical Provider, MD  omeprazole (PRILOSEC) 20 MG capsule Take 20 mg by mouth daily.   Yes Historical Provider, MD  phenol (CHLORASEPTIC) 1.4 % LIQD Use as directed 1 spray in the mouth or throat every 2 (two) hours as needed for throat  irritation / pain.   Yes Historical Provider, MD  potassium chloride (MICRO-K) 10 MEQ CR capsule Take 20 mEq by mouth every other day.    Yes Historical Provider, MD  Propylene Glycol (SYSTANE BALANCE) 0.6 % SOLN Place 1 drop into both eyes 2 (two) times daily.   Yes Historical Provider, MD  sennosides-docusate sodium (SENOKOT-S) 8.6-50 MG tablet Take 1 tablet by mouth 2 (two) times daily.   Yes Historical Provider, MD  traMADol (ULTRAM) 50 MG tablet Take 50 mg by mouth 2 (two) times daily as needed for moderate pain.   Yes Historical Provider, MD  vitamin B-12 (CYANOCOBALAMIN) 500 MCG tablet Take 500 mcg by mouth daily.   Yes Historical Provider, MD  feeding supplement, ENSURE ENLIVE, (ENSURE ENLIVE) LIQD Take 237 mLs by mouth 2 (two) times daily between meals. Patient not taking: Reported on 10/02/2014 09/12/14   Drema Dallas, MD    Allergies  Allergen Reactions  . Codeine     unknown  . Doxazosin Mesylate     unknown  . Gemfibrozil     unknown  . Rosiglitazone Maleate     REACTION: sensitivity    Physical Exam  Vitals  Blood pressure 142/46, pulse 94, temperature 98.8 F (37.1 C), temperature source Core (Comment), resp. rate 20, SpO2 95 %.   General:  In mild distress as evidenced ongoing tachypnea, but she is now normothermic and warming blanket has been removed by RN  Psych: Somewhat lethargic but in the past 30 minutes has been awakening more frequently but drifts off to sleep easily, exam limited by extreme hard of hearing  Neuro:   No focal neurological deficits, CN II through XII intact, Strength 4/5 all 4 extremities, Sensation intact all 4 extremities. Unable to turn himself on stretcher  ENT:  External Ears and Eyes appear Normal-otoscopic exam was not performed, Conjunctivae clear, sclerae are injected bilaterally PER. Very dry oral mucosa without erythema or exudates.  Neck:  Supple, No lymphadenopathy appreciated  Respiratory:  Symmetrical chest wall movement,  coarse sounding bilateral lung sounds diminished in right base when auscultated posteriorly, 4 L oxygen, persistent tachypnea with  respiratory rates in the 25-30 range  Cardiac:  RRR, No Murmurs, no LE edema noted, no JVD, No carotid bruits, peripheral pulses palpable at 2+  Abdomen:  Positive bowel sounds, Soft, minimally tender for suprapubic area, Non distended,  No masses appreciated, no obvious hepatosplenomegaly  Genitourinary: Foley catheter in place with clear yellow urine bedside bag  Skin:  No Cyanosis, poor Skin Turgor, No Skin Rash or Bruise.  Extremities: Symmetrical without obvious trauma or injury,  no effusions.  Data Review  CBC  Recent Labs Lab 12/11/2014 1223 12/09/2014 1228  WBC 9.0  --   HGB 13.0 15.0  HCT 41.1 44.0  PLT 246  --   MCV 103.0*  --   MCH 32.6  --   MCHC 31.6  --   RDW 13.8  --   LYMPHSABS 1.0  --   MONOABS 0.6  --   EOSABS 0.2  --   BASOSABS 0.1  --     Chemistries   Recent Labs Lab 12/08/2014 1223 11/19/2014 1228  NA 139 140  K 4.5 4.3  CL 93* 96*  CO2 34*  --   GLUCOSE 211* 217*  BUN 64* 61*  CREATININE 1.54* 1.60*  CALCIUM 10.2  --   AST 15  --   ALT 12*  --   ALKPHOS 75  --   BILITOT 0.5  --     CrCl cannot be calculated (Unknown ideal weight.).  No results for input(s): TSH, T4TOTAL, T3FREE, THYROIDAB in the last 72 hours.  Invalid input(s): FREET3  Coagulation profile  Recent Labs Lab 11/19/2014 1223  INR 0.96    No results for input(s): DDIMER in the last 72 hours.  Cardiac Enzymes No results for input(s): CKMB, TROPONINI, MYOGLOBIN in the last 168 hours.  Invalid input(s): CK  Invalid input(s): POCBNP  Urinalysis    Component Value Date/Time   COLORURINE YELLOW 11/24/2014 1245   APPEARANCEUR HAZY* 12/02/2014 1245   LABSPEC 1.014 11/28/2014 1245   PHURINE 5.0 11/24/2014 1245   GLUCOSEU NEGATIVE 11/28/2014 1245   HGBUR NEGATIVE 12/05/2014 1245   BILIRUBINUR NEGATIVE 11/11/2014 1245   KETONESUR  NEGATIVE 12/05/2014 1245   PROTEINUR NEGATIVE 12/01/2014 1245   UROBILINOGEN 0.2 11/15/2014 1245   NITRITE POSITIVE* 12/03/2014 1245   LEUKOCYTESUR NEGATIVE 11/18/2014 1245    Imaging results:   Ct Head Wo Contrast  11/18/2014  CLINICAL DATA:  Altered mental status. History of CVA with right-sided deficits. EXAM: CT HEAD WITHOUT CONTRAST TECHNIQUE: Contiguous axial images were obtained from the base of the skull through the vertex without intravenous contrast. COMPARISON:  09/03/2014 head CT. FINDINGS: No evidence of parenchymal hemorrhage or extra-axial fluid collection. No mass lesion, mass effect, or midline shift. No CT evidence of acute infarction. Stable remote infarcts in the bilateral thalami and right cerebellar hemisphere. Stable prominent intracranial atherosclerosis, diffuse cerebral volume loss and extensive periventricular and subcortical white matter hypodensity, likely reflecting chronic small vessel ischemic change. Stable size of the cerebral ventricles, which are concordant with the degree of cerebral volume loss. The visualized paranasal sinuses are essentially clear. The mastoid air cells are unopacified. No evidence of calvarial fracture. IMPRESSION: 1.  No evidence of acute intracranial abnormality. 2. Stable intracranial atherosclerosis, bilateral thalamic and right cerebellar hemisphere remote infarcts, diffuse cerebral volume loss and extensive chronic small vessel ischemic white matter change. Electronically Signed   By: Delbert Phenix M.D.   On: 11/12/2014 13:23   Dg Chest Port 1 View  12/05/2014  CLINICAL DATA:  Altered mental status.  COPD.  CHF. EXAM: PORTABLE CHEST 1 VIEW COMPARISON:  10/04/2014 chest radiograph FINDINGS: Low lung volumes. Stable mild to moderate elevation of the right hemidiaphragm. Stable cardiomediastinal silhouette with mild cardiomegaly. No pneumothorax. Stable small right pleural effusion. No left pleural effusion. Mild pulmonary edema. Curvilinear  opacities at the right lung base, in keeping with mild right basilar atelectasis. Bilateral breast prostheses with calcified capsules are again noted. IMPRESSION: 1. Stable mild cardiomegaly with mild pulmonary edema, in keeping with mild congestive heart failure. 2. Stable small right pleural effusion. 3. Mild right basilar atelectasis. Electronically Signed   By: Delbert PhenixJason A Poff M.D.   On: 11/23/2014 12:16     EKG: (Independently reviewed) sinus rhythm with frequent PACs, ventricular rate 93 bpm, QTC 476 ms, borderline voltage criteria for LVH, elevated J point lateral leads, no ischemic changes   Assessment & Plan  Principal Problem:   Metabolic encephalopathy -Admit to stepdown -Likely multifactorial etiology: Infection, hypercarbia, dehydration -Treat underlying causes and reverse if possible -Has begun waking up more in the ER -if remains alert we'll begin diet -Check ammonia level  Active Problems:   Chronic respiratory failure with hypoxia (HCC):   A) COPD (chronic obstructive pulmonary disease) (HCC)   B) Hypertrophic obstructive cardiomyopathy (HCC) -Stable on baseline 4 L oxygen -Based on chest x-ray does not have appearance of healthcare acquired pneumonia -ABG does reveal slightly increased PCO2 from baseline with normal pH but elevated bicarbonate and base excess which likely reflects renal compensation of respiratory acidosis (patient with continued tachypnea which is also compensatory mechanism) -Intended to monitor closely in the event patient needs BiPAP -Heart failure appears compensated during at this time patient actually appears volume depleted therefore we'll hold Lasix but continue to treat elevated blood pressure     UTI (lower urinary tract infection) -Urinalysis appears consistent with UTI except for normal WBCs but patient also on Levaquin since 10/14 as an outpatient which may be blunting affect -Follow up on urine culture and blood cultures -Continue  Rocephin -UTI likely explains patient's initial presentation with metabolic encephalopathy and hypothermia   Volume depletion -Gentle IV fluid hydration with normal saline at 30 mL per hour 12 hours -Hold home Lasix for now    ANEMIA -Baseline hemoglobin around 10-11 and current hemoglobin 13 which appears to be reflective of hemoconcentration further supporting clinical presentation of volume depletion    Essential hypertension -Current blood pressure moderately controlled with low diastolic blood pressure readings -Continue Norvasc and hydralazine for now    Diabetes mellitus with insulin therapy (HCC) -Hold long-acting Levemir for now -Provide SSI    CKD (chronic kidney disease) stage 3, GFR 30-59 ml/min -Baseline renal function BUN 63 and creatinine 1.18 current renal function BUN 61 and creatinine 1.60 -Holding diuretics and providing short-term gentle IV fluid hydration -Follow up electrolytes in a.m.    GERD -Continue PPI    HLD (hyperlipidemia) -Continue Lipitor    History of arterial ischemic stroke (2010) -Has demonstrated clinical signs of failure to thrive (see above medical problems) -Family has determined that DO NOT RESUSCITATE status would be appropriate but for now we'll continue to aggressively treat current infectious process    DVT Prophylaxis: Lovenox  Family Communication:  Daughters at bedside   Code Status:  DO NOT RESUSCITATE  Condition:  Guarded  Discharge disposition: Dissipate discharge back to skilled nursing facility pending resolution of current acute medical issues  Time spent in minutes : 60      Crytal Pensinger  L. ANP on Dec 04, 2014 at 3:18 PM  Between 7am to 7pm - Pager - [phone]  After 7pm go to [email or web address] - password [email or web address]  And look for the night coverage person covering me after hours  [facility]

## 2014-11-29 NOTE — ED Notes (Signed)
Patient noted to be very lethargic and barely able to speak. She states that she is feeling bad but is unable to provide additional information. Denies pain.

## 2014-11-29 NOTE — ED Provider Notes (Signed)
  Face-to-face evaluation   History: Patient transferred from her nursing care facility for evaluation of decreased responsiveness.  Physical exam: Elderly patient, alert to voice, speaks and states that she is thirsty. Mucus membranes are very dry.   Medical screening examination/treatment/procedure(s) were conducted as a shared visit with non-physician practitioner(s) and myself.  I personally evaluated the patient during the encounter  Mancel BaleElliott Timira Bieda, MD 11/30/14 [phone]

## 2014-11-29 NOTE — Progress Notes (Signed)
Clarification: Pt to be admitted to medical floor and NOT to SDU as documented in H/P.  Junious SilkAllison Ellis, ANP

## 2014-11-29 NOTE — ED Notes (Signed)
Patient tolerated PO challenge without incident.

## 2014-11-29 NOTE — ED Notes (Signed)
Doctor notified another nurse patient sacral stage 1 wound.

## 2014-11-29 NOTE — ED Provider Notes (Signed)
CSN: [id number]     Arrival date & time 11/30/2014  1133 History   First MD Initiated Contact with Patient 11/20/2014 1154     Chief Complaint  Patient presents with  . Altered Mental Status     (Consider location/radiation/quality/duration/timing/severity/associated sxs/prior Treatment) HPI   Blood pressure 179/65, pulse 85, temperature 96 F (35.6 C), temperature source Rectal, resp. rate 18, SpO2 99 %.  [place] Stang is a 81 y.o. female brought in by EMS from [facility] for altered mental status: Patient was found to be lethargic this a.m. She is normally conversant. Level V caveat secondary to altered mental status. She comes with paperwork showing that she is full code but has selected no intubation or mechanical ventilation. Patient's daughter is her medical power of attorney. Patient is nonverbal but can nod and shake her head. States that she has headache and chest pain denies abdominal pain.  Past Medical History  Diagnosis Date  . Depression   . Hypercholesteremia   . Hypertension   . Diabetes mellitus without complication   . COPD (chronic obstructive pulmonary disease)   . Renal disorder   . Chronic kidney disease   . CHF (congestive heart failure)   . Stroke     right deficits   Past Surgical History  Procedure Laterality Date  . Abdominal hysterectomy      partial   No family history on file. Social History  Substance Use Topics  . Smoking status: Former Smoker    Quit date: 07/06/2008  . Smokeless tobacco: Never Used  . Alcohol Use: No   OB History    No data available     Review of Systems  Unable to perform ROS: Mental status change      Allergies  Codeine; Doxazosin mesylate; Gemfibrozil; and Rosiglitazone maleate  Home Medications   Prior to Admission medications   Medication Sig Start Date End Date Taking? Authorizing Provider  albuterol (PROVENTIL HFA;VENTOLIN HFA) 108 (90 BASE) MCG/ACT inhaler Inhale 2 puffs into the lungs every 6  (six) hours as needed for shortness of breath.   Yes Historical Provider, MD  ALPRAZolam (XANAX) 0.25 MG tablet Take 0.25 mg by mouth 2 (two) times daily as needed for anxiety.   Yes Historical Provider, MD  Amino Acids-Protein Hydrolys (FEEDING SUPPLEMENT, PRO-STAT SUGAR FREE 64,) LIQD Take 30 mLs by mouth 2 (two) times daily.    Yes Historical Provider, MD  amLODipine (NORVASC) 10 MG tablet Take 10 mg by mouth daily.   Yes Historical Provider, MD  atorvastatin (LIPITOR) 10 MG tablet Take 10 mg by mouth daily.   Yes Historical Provider, MD  carbamide peroxide (DEBROX) 6.5 % otic solution Place 5 drops into both ears at bedtime.   Yes Historical Provider, MD  Cholecalciferol 1000 UNITS capsule Take 1,000 Units by mouth 2 (two) times daily.   Yes Historical Provider, MD  docusate sodium (COLACE) 100 MG capsule Take 100 mg by mouth 2 (two) times daily.   Yes Historical Provider, MD  DULoxetine (CYMBALTA) 60 MG capsule Take 60 mg by mouth daily.   Yes Historical Provider, MD  ferrous sulfate 325 (65 FE) MG tablet Take 325 mg by mouth 3 (three) times daily.   Yes Historical Provider, MD  fluticasone (FLONASE) 50 MCG/ACT nasal spray Place 2 sprays into both nostrils 2 (two) times daily.    Yes Historical Provider, MD  furosemide (LASIX) 40 MG tablet Take 1 tablet (40 mg total) by mouth 2 (two) times daily. 10/09/14  Yes Hollice Espy, MD  guaifenesin (ROBITUSSIN) 100 MG/5ML syrup Take 200 mg by mouth every 4 (four) hours as needed for cough.   Yes Historical Provider, MD  hydrALAZINE (APRESOLINE) 100 MG tablet Take 100 mg by mouth 3 (three) times daily.   Yes Historical Provider, MD  insulin aspart (NOVOLOG FLEXPEN) 100 UNIT/ML FlexPen Inject 5 Units into the skin 3 (three) times daily with meals. 09/12/14  Yes Drema Dallas, MD  insulin detemir (LEVEMIR) 100 UNIT/ML injection Inject 0.08 mLs (8 Units total) into the skin 2 (two) times daily. Patient taking differently: Inject 14 Units into the skin 2  (two) times daily.  10/09/14  Yes Hollice Espy, MD  Insulin Pen Needle (AURORA PEN NEEDLES) 29G X MISC 5 Units by Does not apply route every morning. 09/12/14  Yes Drema Dallas, MD  ipratropium-albuterol (DUONEB) 0.5-2.5 (3) MG/3ML SOLN Take 3 mLs by nebulization every 6 (six) hours as needed. For shortness of breath   Yes Historical Provider, MD  levofloxacin (LEVAQUIN) 500 MG tablet Take 500 mg by mouth daily.   Yes Historical Provider, MD  loratadine (CLARITIN) 10 MG tablet Take 10 mg by mouth daily as needed for allergies.   Yes Historical Provider, MD  mirtazapine (REMERON) 7.5 MG tablet Take 7.5 mg by mouth at bedtime.   Yes Historical Provider, MD  Multiple Vitamins-Minerals (DECUBI-VITE) CAPS Take 1 capsule by mouth daily.   Yes Historical Provider, MD  Omega-3 Fatty Acids (FISH OIL) 1000 MG CAPS Take 1 capsule by mouth 2 (two) times daily.   Yes Historical Provider, MD  omeprazole (PRILOSEC) 20 MG capsule Take 20 mg by mouth daily.   Yes Historical Provider, MD  phenol (CHLORASEPTIC) 1.4 % LIQD Use as directed 1 spray in the mouth or throat every 2 (two) hours as needed for throat irritation / pain.   Yes Historical Provider, MD  potassium chloride (MICRO-K) 10 MEQ CR capsule Take 20 mEq by mouth every other day.    Yes Historical Provider, MD  Propylene Glycol (SYSTANE BALANCE) 0.6 % SOLN Place 1 drop into both eyes 2 (two) times daily.   Yes Historical Provider, MD  sennosides-docusate sodium (SENOKOT-S) 8.6-50 MG tablet Take 1 tablet by mouth 2 (two) times daily.   Yes Historical Provider, MD  traMADol (ULTRAM) 50 MG tablet Take 50 mg by mouth 2 (two) times daily as needed for moderate pain.   Yes Historical Provider, MD  vitamin B-12 (CYANOCOBALAMIN) 500 MCG tablet Take 500 mcg by mouth daily.   Yes Historical Provider, MD  feeding supplement, ENSURE ENLIVE, (ENSURE ENLIVE) LIQD Take 237 mLs by mouth 2 (two) times daily between meals. Patient not taking: Reported on 10/02/2014  09/12/14   Drema Dallas, MD   BP 179/65 mmHg  Pulse 85  Temp(Src) 96 F (35.6 C) (Rectal)  Resp 18  SpO2 99% Physical Exam  Constitutional: She is oriented to person, place, and time. She appears well-developed and well-nourished. She appears listless. No distress.  HENT:  Head: Normocephalic.  Mouth/Throat: Oropharynx is clear and moist.  extremely dry mucous membranes  Eyes: Conjunctivae and EOM are normal. Pupils are equal, round, and reactive to light.  Cardiovascular: Normal rate, regular rhythm and intact distal pulses.   Pulmonary/Chest: Effort normal and breath sounds normal. No stridor. No respiratory distress. She has no wheezes. She has no rales. She exhibits no tenderness.  Abdominal: Soft. Bowel sounds are normal. She exhibits no distension and no mass. There is  no tenderness. There is no rebound and no guarding.  Musculoskeletal: Normal range of motion.  Neurological: She is oriented to person, place, and time. She appears listless.  PERRL + Gag reflex, arouses to pain. Nonverbal. Patient communicates with nodding or shaking her head.  Skin: Rash noted.  Large irregular mole on left breast  Psychiatric: She has a normal mood and affect.  Nursing note and vitals reviewed.   ED Course  Procedures (including critical care time)  CRITICAL CARE Performed by: Wynetta Emery   Total critical care time: 50  Critical care time was exclusive of separately billable procedures and treating other patients.  Critical care was necessary to treat or prevent imminent or life-threatening deterioration.  Critical care was time spent personally by me on the following activities: development of treatment plan with patient and/or surrogate as well as nursing, discussions with consultants, evaluation of patient's response to treatment, examination of patient, obtaining history from patient or surrogate, ordering and performing treatments and interventions, ordering and review of  laboratory studies, ordering and review of radiographic studies, pulse oximetry and re-evaluation of patient's condition.   Labs Review Labs Reviewed  CBC WITH DIFFERENTIAL/PLATELET - Abnormal; Notable for the following:    MCV 103.0 (*)    All other components within normal limits  COMPREHENSIVE METABOLIC PANEL - Abnormal; Notable for the following:    Chloride 93 (*)    CO2 34 (*)    Glucose, Bld 211 (*)    BUN 64 (*)    Creatinine, Ser 1.54 (*)    Albumin 3.3 (*)    ALT 12 (*)    GFR calc non Af Amer 30 (*)    GFR calc Af Amer 35 (*)    All other components within normal limits  URINALYSIS, ROUTINE W REFLEX MICROSCOPIC (NOT AT [facility]) - Abnormal; Notable for the following:    APPearance HAZY (*)    Nitrite POSITIVE (*)    All other components within normal limits  URINE MICROSCOPIC-ADD ON - Abnormal; Notable for the following:    Bacteria, UA MANY (*)    Casts HYALINE CASTS (*)    All other components within normal limits  CBG MONITORING, ED - Abnormal; Notable for the following:    Glucose-Capillary 216 (*)    All other components within normal limits  I-STAT CHEM 8, ED - Abnormal; Notable for the following:    Chloride 96 (*)    BUN 61 (*)    Creatinine, Ser 1.60 (*)    Glucose, Bld 217 (*)    All other components within normal limits  CULTURE, BLOOD (ROUTINE X 2)  URINE CULTURE  CULTURE, BLOOD (ROUTINE X 2)  PROTIME-INR  AMMONIA  BLOOD GAS, ARTERIAL  I-STAT CG4 LACTIC ACID, ED  I-STAT TROPOININ, ED    Imaging Review Ct Head Wo Contrast  12/01/2014  CLINICAL DATA:  Altered mental status. History of CVA with right-sided deficits. EXAM: CT HEAD WITHOUT CONTRAST TECHNIQUE: Contiguous axial images were obtained from the base of the skull through the vertex without intravenous contrast. COMPARISON:  09/03/2014 head CT. FINDINGS: No evidence of parenchymal hemorrhage or extra-axial fluid collection. No mass lesion, mass effect, or midline shift. No CT evidence of acute  infarction. Stable remote infarcts in the bilateral thalami and right cerebellar hemisphere. Stable prominent intracranial atherosclerosis, diffuse cerebral volume loss and extensive periventricular and subcortical white matter hypodensity, likely reflecting chronic small vessel ischemic change. Stable size of the cerebral ventricles, which are concordant with the degree  of cerebral volume loss. The visualized paranasal sinuses are essentially clear. The mastoid air cells are unopacified. No evidence of calvarial fracture. IMPRESSION: 1.  No evidence of acute intracranial abnormality. 2. Stable intracranial atherosclerosis, bilateral thalamic and right cerebellar hemisphere remote infarcts, diffuse cerebral volume loss and extensive chronic small vessel ischemic white matter change. Electronically Signed   By: Delbert PhenixJason A Poff M.D.   On: 11/19/2014 13:23   Dg Chest Port 1 View  11/21/2014  CLINICAL DATA:  Altered mental status.  COPD.  CHF. EXAM: PORTABLE CHEST 1 VIEW COMPARISON:  10/04/2014 chest radiograph FINDINGS: Low lung volumes. Stable mild to moderate elevation of the right hemidiaphragm. Stable cardiomediastinal silhouette with mild cardiomegaly. No pneumothorax. Stable small right pleural effusion. No left pleural effusion. Mild pulmonary edema. Curvilinear opacities at the right lung base, in keeping with mild right basilar atelectasis. Bilateral breast prostheses with calcified capsules are again noted. IMPRESSION: 1. Stable mild cardiomegaly with mild pulmonary edema, in keeping with mild congestive heart failure. 2. Stable small right pleural effusion. 3. Mild right basilar atelectasis. Electronically Signed   By: Delbert PhenixJason A Poff M.D.   On: 12/07/2014 12:16   I have personally reviewed and evaluated these images and lab results as part of my medical decision-making.   EKG Interpretation   Date/Time:  Wednesday November 29 2014 11:43:21 EDT Ventricular Rate:  93 PR Interval:  145 QRS Duration:  82 QT Interval:  383 QTC Calculation: 476 R Axis:   30 Text Interpretation:  Sinus rhythm Supraventricular bigeminy Probable LVH  with secondary repol abnrm since last tracing no significant change  Confirmed by Effie ShyWENTZ  MD, ELLIOTT [phone]) on 11/22/2014 2:21:13 PM      MDM   Final diagnoses:  Lethargy  Hypothermia, initial encounter  UTI (lower urinary tract infection)  AKI (acute kidney injury) (HCC)    Filed Vitals:   11/12/2014 1148 11/28/2014 1438  BP: 179/65 142/46  Pulse: 85 94  Temp: 96 F (35.6 C) 98.8 F (37.1 C)  TempSrc: Rectal Core (Comment)  Resp: 18 20  SpO2: 99% 95%    Medications  cefTRIAXone (ROCEPHIN) 1 g in dextrose 5 % 50 mL IVPB (not administered)  sodium chloride 0.9 % bolus 500 mL (not administered)  sodium chloride 0.9 % bolus 500 mL (500 mLs Intravenous New Bag/Given 11/17/2014 1222)    [place] C Casteneda is 81 y.o. female presenting with altered mental status and hypothermia. Patient is pancultured, CT head, chest x-ray and blood work pending. Appears severely dehydrated. Has history of CHF, will give 500 mL aliquots of normal saline.  Patient has a normal lactic acid level, no leukocytosis. Blood work reassuring however her creatinine is elevated at 1.6. Appears her baseline is more like 1.2. Head CT negative, chest x-ray without acute change. EKG no acute changes.   Urinalysis consistent with infection. Her prior urine cultures with no significant growth, no sensitivity. Patient will be started on Rocephin.  2:05 PM: Patient reexamined her eyes are open spontaneously, she is able to verbalize. Significantly improved and patient. Temp Foley shows 36 9. She's been on the bear hugger for approximately 1 hour.   This is a shared visit with the attending physician who personally evaluated the patient and agrees with the care plan.      Wynetta Emeryicole Lakisa Lotz, PA-C 11/14/2014 1433  Wynetta Emeryicole Kenna Seward, PA-C 11/28/2014 1454  Mancel BaleElliott Wentz, MD 11/30/14  2344  Mancel BaleElliott Wentz, MD 11/30/14 [phone]

## 2014-11-29 NOTE — ED Notes (Signed)
Admit Doctor at bedside.  

## 2014-11-29 NOTE — ED Notes (Signed)
Patient from [facility] with C/O AMS.  Seen by MD who says that she has decreased respiratory effort and is not as alert as normal so she should be sent out.

## 2014-11-29 NOTE — ED Notes (Signed)
Attempting to call floor for report. 28M stated patient not being admitted to 28M. Attempted to call Flow Manager left message for call back.

## 2014-11-30 DIAGNOSIS — I421 Obstructive hypertrophic cardiomyopathy: Secondary | ICD-10-CM

## 2014-11-30 DIAGNOSIS — K219 Gastro-esophageal reflux disease without esophagitis: Secondary | ICD-10-CM

## 2014-11-30 DIAGNOSIS — Z8673 Personal history of transient ischemic attack (TIA), and cerebral infarction without residual deficits: Secondary | ICD-10-CM

## 2014-11-30 DIAGNOSIS — E785 Hyperlipidemia, unspecified: Secondary | ICD-10-CM

## 2014-11-30 DIAGNOSIS — J9621 Acute and chronic respiratory failure with hypoxia: Secondary | ICD-10-CM

## 2014-11-30 DIAGNOSIS — D649 Anemia, unspecified: Secondary | ICD-10-CM

## 2014-11-30 DIAGNOSIS — E119 Type 2 diabetes mellitus without complications: Secondary | ICD-10-CM

## 2014-11-30 DIAGNOSIS — Z794 Long term (current) use of insulin: Secondary | ICD-10-CM

## 2014-11-30 DIAGNOSIS — J9611 Chronic respiratory failure with hypoxia: Secondary | ICD-10-CM

## 2014-11-30 LAB — COMPREHENSIVE METABOLIC PANEL
ALK PHOS: 64 U/L (ref 38–126)
ALT: 13 U/L — AB (ref 14–54)
ANION GAP: 10 (ref 5–15)
AST: 14 U/L — ABNORMAL LOW (ref 15–41)
Albumin: 2.6 g/dL — ABNORMAL LOW (ref 3.5–5.0)
BUN: 62 mg/dL — ABNORMAL HIGH (ref 6–20)
CALCIUM: 9.6 mg/dL (ref 8.9–10.3)
CO2: 33 mmol/L — AB (ref 22–32)
CREATININE: 1.69 mg/dL — AB (ref 0.44–1.00)
Chloride: 100 mmol/L — ABNORMAL LOW (ref 101–111)
GFR, EST AFRICAN AMERICAN: 31 mL/min — AB (ref >60)
GFR, EST NON AFRICAN AMERICAN: 27 mL/min — AB (ref >60)
Glucose, Bld: 261 mg/dL — ABNORMAL HIGH (ref 65–99)
Potassium: 4.5 mmol/L (ref 3.5–5.1)
Sodium: 143 mmol/L (ref 135–145)
Total Bilirubin: 0.7 mg/dL (ref 0.3–1.2)
Total Protein: 5.2 g/dL — ABNORMAL LOW (ref 6.5–8.1)

## 2014-11-30 LAB — GLUCOSE, CAPILLARY
GLUCOSE-CAPILLARY: 213 mg/dL — AB (ref 65–99)
GLUCOSE-CAPILLARY: 236 mg/dL — AB (ref 65–99)
Glucose-Capillary: 221 mg/dL — ABNORMAL HIGH (ref 65–99)
Glucose-Capillary: 235 mg/dL — ABNORMAL HIGH (ref 65–99)
Glucose-Capillary: 252 mg/dL — ABNORMAL HIGH (ref 65–99)
Glucose-Capillary: 300 mg/dL — ABNORMAL HIGH (ref 65–99)
Glucose-Capillary: 367 mg/dL — ABNORMAL HIGH (ref 65–99)

## 2014-11-30 LAB — CBC
HCT: 35.9 % — ABNORMAL LOW (ref 36.0–46.0)
HEMOGLOBIN: 11.2 g/dL — AB (ref 12.0–15.0)
MCH: 32.7 pg (ref 26.0–34.0)
MCHC: 31.2 g/dL (ref 30.0–36.0)
MCV: 104.7 fL — ABNORMAL HIGH (ref 78.0–100.0)
PLATELETS: 201 10*3/uL (ref 150–400)
RBC: 3.43 MIL/uL — AB (ref 3.87–5.11)
RDW: 14.1 % (ref 11.5–15.5)
WBC: 16.5 10*3/uL — AB (ref 4.0–10.5)

## 2014-11-30 LAB — MRSA PCR SCREENING: MRSA BY PCR: NEGATIVE

## 2014-11-30 MED ORDER — HYDRALAZINE HCL 20 MG/ML IJ SOLN
10.0000 mg | Freq: Four times a day (QID) | INTRAMUSCULAR | Status: DC | PRN
  Administered 2014-11-30: 10 mg via INTRAVENOUS
  Filled 2014-11-30: qty 1

## 2014-11-30 MED ORDER — MORPHINE SULFATE (PF) 2 MG/ML IV SOLN
INTRAVENOUS | Status: AC
  Administered 2014-11-30: 1 mg via INTRAVENOUS
  Filled 2014-11-30: qty 1

## 2014-11-30 MED ORDER — MORPHINE SULFATE (PF) 2 MG/ML IV SOLN
1.0000 mg | INTRAVENOUS | Status: DC | PRN
  Administered 2014-11-30 – 2014-12-01 (×5): 1 mg via INTRAVENOUS
  Filled 2014-11-30 (×5): qty 1

## 2014-11-30 MED ORDER — INSULIN ASPART 100 UNIT/ML ~~LOC~~ SOLN
0.0000 [IU] | SUBCUTANEOUS | Status: DC
  Administered 2014-11-30: 3 [IU] via SUBCUTANEOUS

## 2014-11-30 MED ORDER — SODIUM CHLORIDE 0.9 % IV SOLN
500.0000 mg | INTRAVENOUS | Status: DC
  Administered 2014-11-30: 500 mg via INTRAVENOUS
  Filled 2014-11-30: qty 500

## 2014-11-30 MED ORDER — LORAZEPAM 2 MG/ML IJ SOLN
0.5000 mg | INTRAMUSCULAR | Status: DC | PRN
  Administered 2014-11-30: 0.5 mg via INTRAVENOUS
  Filled 2014-11-30: qty 1

## 2014-11-30 NOTE — Progress Notes (Signed)
Received call from Lab regarding patient's blood cultures. Notified that the patient has gram positive cocci in clusters in her blood. Dr. Catha GosselinMikhail notified and Vancomycin was ordered. Noe GensStefanie A Gladyes Kudo, RN

## 2014-11-30 NOTE — Significant Event (Signed)
Rapid Response Event Note  Overview: Time Called: 1045 Event Type: Respiratory  Initial Focused Assessment: Family called staff because patient "blue".  Staff at bedside, O2 sat 44%.  RR 40  increased WOB and paradoxical. Poor air movement Family at bedside Patient awake but very weak and not able to cough on command  Interventions: Placed on 100% NRB O2 sat 80%  RR 38, NTS - no secretions,  No cough RT at bedside, Respiratory treatment given. Spoke with MD,  Family spoke with MD,  Per Dr Catha Gosselinmikhail transitioning to comfort care. Placed patient on BiPap 100% Fio2,  O2 sat 87% 1mg  Morphine given IV Patient transferred to 2C06 via bed with Bipap Family at bedside.  Event Summary: Name of Physician Notified: Mikhail at 1045    at    Outcome: Transferred (Comment) (2c06)     Michelle Bowers, Michelle Bowers

## 2014-11-30 NOTE — Progress Notes (Signed)
Called to pt's room by Rapid Response RN, on arrival pt on 100% NRB, Spo2 72%, RR >35, increased WOB, Duoneb tx given and pt placed on NIV per MD order, pt tolerating well, transferring to SD unit, RT will continue to monitor

## 2014-11-30 NOTE — Progress Notes (Signed)
Utilization Review Completed.  

## 2014-11-30 NOTE — Progress Notes (Signed)
ANTIBIOTIC CONSULT NOTE - INITIAL  Pharmacy Consult for Vancomycin Indication: GPC bacteremia  Allergies  Allergen Reactions  . Codeine     unknown  . Doxazosin Mesylate     unknown  . Gemfibrozil     unknown  . Rosiglitazone Maleate     REACTION: sensitivity    Patient Measurements: Height: 5\' 2"  (157.5 cm) Weight: 109 lb (49.442 kg) IBW/kg (Calculated) : 50.1  Vital Signs: Temp: 97.3 F (36.3 C) (10/20 1658) Temp Source: Axillary (10/20 1658) BP: 120/59 mmHg (10/20 1703) Pulse Rate: 73 (10/20 1703) Intake/Output from previous day: 10/19 0701 - 10/20 0700 In: -  Out: 1450 [Urine:1450] Intake/Output from this shift:    Labs:  Recent Labs  11/26/2014 1223 12/01/2014 1228 11/30/14 0420  WBC 9.0  --  16.5*  HGB 13.0 15.0 11.2*  PLT 246  --  201  CREATININE 1.54* 1.60* 1.69*   Estimated Creatinine Clearance: 20 mL/min (by C-G formula based on Cr of 1.69). No results for input(s): VANCOTROUGH, VANCOPEAK, VANCORANDOM, GENTTROUGH, GENTPEAK, GENTRANDOM, TOBRATROUGH, TOBRAPEAK, TOBRARND, AMIKACINPEAK, AMIKACINTROU, AMIKACIN in the last 72 hours.   Microbiology: Recent Results (from the past 720 hour(s))  Blood culture (routine x 2)     Status: None (Preliminary result)   Collection Time: 11/28/2014 12:04 PM  Result Value Ref Range Status   Specimen Description BLOOD RIGHT ANTECUBITAL  Final   Special Requests BOTTLES DRAWN AEROBIC ONLY 2CC  Final   Culture NO GROWTH 1 DAY  Final   Report Status PENDING  Incomplete  Blood culture (routine x 2)     Status: None (Preliminary result)   Collection Time: 11/21/2014 12:15 PM  Result Value Ref Range Status   Specimen Description BLOOD LEFT HAND  Final   Special Requests BOTTLES DRAWN AEROBIC AND ANAEROBIC 5CC  Final   Culture NO GROWTH 1 DAY  Final   Report Status PENDING  Incomplete  Urine culture     Status: None (Preliminary result)   Collection Time: 11/25/2014 12:45 PM  Result Value Ref Range Status   Specimen  Description URINE, CATHETERIZED  Final   Special Requests NONE  Final   Culture >=100,000 COLONIES/mL ESCHERICHIA COLI  Final   Report Status PENDING  Incomplete  MRSA PCR Screening     Status: None   Collection Time: 11/30/14 11:55 AM  Result Value Ref Range Status   MRSA by PCR NEGATIVE NEGATIVE Final    Comment:        The GeneXpert MRSA Assay (FDA approved for NASAL specimens only), is one component of a comprehensive MRSA colonization surveillance program. It is not intended to diagnose MRSA infection nor to guide or monitor treatment for MRSA infections.     Medical History: Past Medical History  Diagnosis Date  . Depression   . Hypercholesteremia   . Hypertension   . Diabetes mellitus without complication   . COPD (chronic obstructive pulmonary disease)   . Renal disorder   . Chronic kidney disease   . CHF (congestive heart failure)   . Stroke     right deficits    Assessment: [age] year old female patient with a history of chronic hypoxemia and COPD on 4 liters oxygen at baseline, HOCM with chronic recurrent pulmonary edema, chronic kidney disease, history of chronic microvascular ischemia, history of left external capsule and periventricular white matter stroke in 2010, diabetes on insulin, hypertension, anxiety and depression, dyslipidemia who was sent to the ER from skilled nursing facility secondary to altered mentation on October 19  Blood cultures positive for GPC, to begin empiric vancomycin Scr up to 1.69 (CrCl approximately 20 ml/min) Discussions with palliative care in progress   Goal of Therapy:  Vancomycin trough level 15-20 mcg/ml  Plan:  Currently on Rocephin 1 gram iv Q 24 hours Add Vancomycin 500 mg iv Q 48 hours Continue to follow Scr, progress, culture results  Thank you Okey Regal, PharmD [phone]  11/30/2014,5:47 PM

## 2014-11-30 NOTE — Progress Notes (Signed)
[facility]                                                                              Patient Demographics  Michelle Bowers, is a 81 y.o. female, DOB - Oct 11, 1930, ZOX:[id number]  Admit date - 11/14/2014   Admitting Physician Calvert Cantor, MD  Outpatient Primary MD for the patient is Julian Hy, MD  LOS - 1   Chief Complaint  Patient presents with  . Altered Mental Status      HPI on 12/11/2014 by Ms. [name], NP This is an 81 year old female patient with a history of chronic hypoxemia and COPD on 4 liters oxygen at baseline, HOCM with chronic recurrent pulmonary edema, chronic kidney disease, history of chronic microvascular ischemia, history of left external capsule and periventricular white matter stroke in 2010, diabetes on insulin, hypertension, anxiety and depression, dyslipidemia who was sent to the ER from skilled nursing facility secondary to altered mentation. Patient apparently was nonverbal at presentation but was able to convey to the ER staff that she was having headache and chest pain but no abdominal pain.  Upon arrival to the ER the patient was hypothermic with a rectal temperature of 96.3 prompting placement of warming blanket, patient was hypertensive with a blood pressure 179/65, heart rate was stable at 85 and she was maintaining sinus rhythm, she was on 2 L nasal cannula oxygen with sats 99%. Chest x-ray showed stable mild cardiomegaly and mild pulmonary edema with a stable small right pleural effusion and mild right basilar atelectasis. CT of the head was unremarkable. Electrolyte panel was normal except for chloride 96, BUN 61, creatinine 1.6 and elevated CO2 of 34. Glucose was slightly elevated to 17, LFTs were normal, troponin was normal, lactic acid was normal. CBC was normal except for elevated MCV of 103. PT/INR was normal. Urinalysis was abnormal and concerning for UTI with hazy appearance, many bacteria, hyaline casts, positive nitrite, 0-2  WBCs. Blood cultures and urine cultures were obtained in the ER. Because of presentation with hypothermia and altered mentation with apparent infectious source i.e. UTI patient was treated as possible sepsis despite normal lactic acid. She was given IV antibiotics (Rocephin) and because of her history of obstructive cardiomyopathy with rapid transition to heart failure she has been given small volume fluid challenges totaling 1 L since arrival. With the above treatments patient's mentation has improved noting she now awakens and attempts to verbally communicate noting this is limited by her hard of hearing status. Unfortunately she continues to drift off to sleep easily and remains tachypnea.  In talking with the daughters at the bedside, the patient's physician at the nursing facility had started her on Levaquin for presumed pneumonia/ possible UTI on 10/14. She also was being treated with Debrox for cerumen impaction.  Since my initial discussion with the patient's family my attending has evaluated the patient and all the family has decided to make the patient a DO NOT RESUSCITATE.  Assessment & Plan   Acute on chronic respiratory failure with hypoxia/ COPD -patient became more hypoxic with sats 72% on 100% NRB -Patient transferred to step down, placed on Bipap (per family wishes), morphine and ativan -will transition patient  to comfort care  UTI  -UA consistent with UTI however WBCs normal, patient was started on Levaquin as an outpatient 11/24/2014 -Urine culture >100K Ecoli -Blood Culture show no growth to date -continue ceftriaxone  Hypertrophic obstructive cardiomyopathy -Heart failure appears to be compensated however Lasix currently held -Opinion monitor closely  Chronic anemia -Baseline hemoglobin appears 10-11, Hb currently 11.2 -Continue monitor CBC  Dehydration -Evident on labs as patient appeared to be hemoconcentrated  -secondary to poor oral intake   Diabetes mellitus,  type II -Levemir held, continue insulin sliding scale with CBG monitoring  Acute on Chronic kidney disease, stage III -baseline Cr 1.2-1.3, currently 1.69 -Continue to monitor BMP -Lasix held  GERD -Continue PPI  Hyperlipidemia -Continue statin  History of CVA  Goals of care -Spoke with family at bedside regarding comfort care, they would like to transition patient but would like to continue BiPAP -Will place on PRN morphine and ativan -Patient's family would like to meet with palliative care, spoke with Corrie Dandy- meeting at 3pm  Code Status: DNR  Family Communication: Family at bedside  Disposition Plan: Admitted, transfer to step down  Time Spent in minutes   45 minutes  Procedures  None  Consults   Palliative care  DVT Prophylaxis  lovenox  Lab Results  Component Value Date   PLT 201 11/30/2014    Medications  Scheduled Meds: . amLODipine  10 mg Oral Daily  . atorvastatin  10 mg Oral Daily  . carbamide peroxide  5 drop Both Ears QHS  . cefTRIAXone (ROCEPHIN)  IV  1 g Intravenous Q24H  . DULoxetine  60 mg Oral Daily  . enoxaparin (LOVENOX) injection  30 mg Subcutaneous Q24H  . hydrALAZINE  100 mg Oral TID  . insulin aspart  0-5 Units Subcutaneous QHS  . insulin aspart  0-9 Units Subcutaneous TID WC  . mirtazapine  7.5 mg Oral QHS  . sodium chloride  3 mL Intravenous Q12H   Continuous Infusions:  PRN Meds:.ipratropium-albuterol, LORazepam, morphine injection  Antibiotics   Anti-infectives    Start     Dose/Rate Route Frequency Ordered Stop   12/02/2014 1400  cefTRIAXone (ROCEPHIN) 1 g in dextrose 5 % 50 mL IVPB     1 g 100 mL/hr over 30 Minutes Intravenous Every 24 hours 12/03/2014 1347       Subjective:   Michelle Bowers seen and examined today.  Patient seen earlier today and was eating breakfast and had no complaints. She knew why she was in the hospital and felt her breathing was about the same as previous days.  Patient began to desat and became  hypoxic, rapid response called at 10:45am.  Objective:   Filed Vitals:   11/30/14 1045 11/30/14 1100 11/30/14 1113 11/30/14 1201  BP: 162/48  133/43 106/32  Pulse: 96  88 66  Temp:    97.2 F (36.2 C)  TempSrc:    Axillary  Resp: Height:     (1.575 m)  Weight:    49.442 kg (109 lb)  SpO2: 44% 80% 87% 92%    Wt Readings from Last 3 Encounters:  11/30/14 49.442 kg (109 lb)  10/09/14 51.3 kg (113 lb 1.5 oz)  09/11/14 56.1 kg (123 lb 10.9 oz)     Intake/Output Summary (Last 24 hours) at 11/30/14 1335 Last data filed at 11/30/14 0622  Gross per 24 hour  Intake      0 ml  Output   1450 ml  Net  -  1450 ml    Exam  General: Well developed, thin, elderly, on BiPAP  HEENT: NCAT, mucous membranes moist.   Cardiovascular: S1 S2 auscultated, RRR  Respiratory: Coarse breath sounds  Abdomen: Soft, nontender, nondistended, + bowel sounds  Extremities: warm dry without cyanosis clubbing or edema  Neuro: AAOx3, nonfocal (upon initial exam)  Data Review   Micro Results Recent Results (from the past 240 hour(s))  Blood culture (routine x 2)     Status: None (Preliminary result)   Collection Time: 11/30/2014 12:04 PM  Result Value Ref Range Status   Specimen Description BLOOD RIGHT ANTECUBITAL  Final   Special Requests BOTTLES DRAWN AEROBIC ONLY 2CC  Final   Culture NO GROWTH 1 DAY  Final   Report Status PENDING  Incomplete  Blood culture (routine x 2)     Status: None (Preliminary result)   Collection Time: 11/25/2014 12:15 PM  Result Value Ref Range Status   Specimen Description BLOOD LEFT HAND  Final   Special Requests BOTTLES DRAWN AEROBIC AND ANAEROBIC 5CC  Final   Culture NO GROWTH 1 DAY  Final   Report Status PENDING  Incomplete  Urine culture     Status: None (Preliminary result)   Collection Time: 11/15/2014 12:45 PM  Result Value Ref Range Status   Specimen Description URINE, CATHETERIZED  Final   Special Requests NONE  Final   Culture >=100,000  COLONIES/mL ESCHERICHIA COLI  Final   Report Status PENDING  Incomplete    Radiology Reports Ct Head Wo Contrast  11/23/2014  CLINICAL DATA:  Altered mental status. History of CVA with right-sided deficits. EXAM: CT HEAD WITHOUT CONTRAST TECHNIQUE: Contiguous axial images were obtained from the base of the skull through the vertex without intravenous contrast. COMPARISON:  09/03/2014 head CT. FINDINGS: No evidence of parenchymal hemorrhage or extra-axial fluid collection. No mass lesion, mass effect, or midline shift. No CT evidence of acute infarction. Stable remote infarcts in the bilateral thalami and right cerebellar hemisphere. Stable prominent intracranial atherosclerosis, diffuse cerebral volume loss and extensive periventricular and subcortical white matter hypodensity, likely reflecting chronic small vessel ischemic change. Stable size of the cerebral ventricles, which are concordant with the degree of cerebral volume loss. The visualized paranasal sinuses are essentially clear. The mastoid air cells are unopacified. No evidence of calvarial fracture. IMPRESSION: 1.  No evidence of acute intracranial abnormality. 2. Stable intracranial atherosclerosis, bilateral thalamic and right cerebellar hemisphere remote infarcts, diffuse cerebral volume loss and extensive chronic small vessel ischemic white matter change. Electronically Signed   By: Delbert Phenix M.D.   On: 12/03/2014 13:23   Dg Chest Port 1 View  12/05/2014  CLINICAL DATA:  Altered mental status.  COPD.  CHF. EXAM: PORTABLE CHEST 1 VIEW COMPARISON:  10/04/2014 chest radiograph FINDINGS: Low lung volumes. Stable mild to moderate elevation of the right hemidiaphragm. Stable cardiomediastinal silhouette with mild cardiomegaly. No pneumothorax. Stable small right pleural effusion. No left pleural effusion. Mild pulmonary edema. Curvilinear opacities at the right lung base, in keeping with mild right basilar atelectasis. Bilateral breast  prostheses with calcified capsules are again noted. IMPRESSION: 1. Stable mild cardiomegaly with mild pulmonary edema, in keeping with mild congestive heart failure. 2. Stable small right pleural effusion. 3. Mild right basilar atelectasis. Electronically Signed   By: Delbert Phenix M.D.   On: 11/25/2014 12:16    CBC  Recent Labs Lab 11/11/2014 1223 11/26/2014 1228 11/30/14 0420  WBC 9.0  --  16.5*  HGB 13.0 15.0 11.2*  HCT 41.1 44.0 35.9*  PLT 246  --  201  MCV 103.0*  --  104.7*  MCH 32.6  --  32.7  MCHC 31.6  --  31.2  RDW 13.8  --  14.1  LYMPHSABS 1.0  --   --   MONOABS 0.6  --   --   EOSABS 0.2  --   --   BASOSABS 0.1  --   --     Chemistries   Recent Labs Lab December 29, 2014 1223 December 29, 2014 1228 11/30/14 0420  NA 139 140 143  K 4.5 4.3 4.5  CL 93* 96* 100*  CO2 34*  --  33*  GLUCOSE 211* 217* 261*  BUN 64* 61* 62*  CREATININE 1.54* 1.60* 1.69*  CALCIUM 10.2  --  9.6  AST 15  --  14*  ALT 12*  --  13*  ALKPHOS 75  --  64  BILITOT 0.5  --  0.7   ------------------------------------------------------------------------------------------------------------------ estimated creatinine clearance is 20 mL/min (by C-G formula based on Cr of 1.69). ------------------------------------------------------------------------------------------------------------------ No results for input(s): HGBA1C in the last 72 hours. ------------------------------------------------------------------------------------------------------------------ No results for input(s): CHOL, HDL, LDLCALC, TRIG, CHOLHDL, LDLDIRECT in the last 72 hours. ------------------------------------------------------------------------------------------------------------------ No results for input(s): TSH, T4TOTAL, T3FREE, THYROIDAB in the last 72 hours.  Invalid input(s): FREET3 ------------------------------------------------------------------------------------------------------------------ No results for input(s): VITAMINB12,  FOLATE, FERRITIN, TIBC, IRON, RETICCTPCT in the last 72 hours.  Coagulation profile  Recent Labs Lab December 29, 2014 1223  INR 0.96    No results for input(s): DDIMER in the last 72 hours.  Cardiac Enzymes No results for input(s): CKMB, TROPONINI, MYOGLOBIN in the last 168 hours.  Invalid input(s): CK ------------------------------------------------------------------------------------------------------------------ Invalid input(s): POCBNP    Adiyah Lame D.O. on 11/30/2014 at 1:35 PM  Between 7am to 7pm - Pager - [phone]  After 7pm go to [email or web address] - password [email or web address]  And look for the night coverage person covering for me after hours  [facility] Office  [phone]

## 2014-11-30 NOTE — Progress Notes (Signed)
Called to pt's room by family.  Was told by secretary per family that pt was turning blue.  Upon entering the room, pt looked pale, and c/o "feeling hot."  Respirations looked labored.  Lungs clear upon ascultation.  Rapid response RN called.  Dr. Catha GosselinMikhail paged and aware.  Pt is DNR and discussed with family to verify that is the current wish and that the patient could decline if oxygen continues to decline.  Family aware.  Vitals documented in chart.  Pt to be placed on BiPAP and to transfer to step-down.  Will continue to monitor.

## 2014-11-30 NOTE — Progress Notes (Signed)
Report called to [place], Charity fundraiserN. Pt transferred to [facility].

## 2014-11-30 NOTE — Progress Notes (Signed)
Recommend starting Levemir 10 units daily if CBGs continue greater than 180 mg/dl.  Patient takes Levemir 14 units BID at home. Will continue to monitor blood sugars while in the hospital. Smith MinceKendra Marykatherine Sherwood RN BSN CDE

## 2014-11-30 NOTE — Progress Notes (Addendum)
Initial Nutrition Assessment  DOCUMENTATION CODES:   Not applicable  INTERVENTION:   Diet advancement per MD. Encourage PO when off BIPAP.  NUTRITION DIAGNOSIS:   Inadequate oral intake related to poor appetite, lethargy/confusion as evidenced by per patient/family report.   GOAL:   Patient will meet greater than or equal to 90% of their needs    MONITOR:   Vent status, Diet advancement, Labs, I & O's, Skin  REASON FOR ASSESSMENT:   Malnutrition Screening Tool    ASSESSMENT:  This is an 81 year old female patient with a history of chronic hypoxemia and COPD on 4 liters oxygen at baseline, HOCM with chronic recurrent pulmonary edema, chronic kidney disease, history of chronic microvascular ischemia, history of left external capsule and periventricular white matter stroke in 2010, diabetes on insulin, hypertension, anxiety and depression, dyslipidemia who was sent to the ER from skilled nursing facility secondary to altered mentation  Pt was [facility] from [facility] to [facility] due to need for continious BIPAP. Experiencing metabolic encephalopathy due to underlying circumstances not yet elucidated. Family was surrounding patient during visit. Stated patient was not eating well at the nursing home, and had mostly lost her appetite. Was drinking ensure at one point, but began denying that as well. Pt does not appear malnourished per NFPA. Monitor MD plan.    Diet Order:  Diet clear liquid Room service appropriate?: Yes; Fluid consistency:: Thin  Skin:  Wound (see comment) (redness to sacrum)  Last BM:  PTA  Height:   Ht Readings from Last 1 Encounters:  11/30/14 5\' 2"  (1.575 m)    Weight:   Wt Readings from Last 1 Encounters:  11/30/14 109 lb (49.442 kg)    Ideal Body Weight:  50 kg  BMI:  Body mass index is 19.93 kg/(m^2).  Estimated Nutritional Needs:   Kcal:  1250-1500 calories  Protein:  50-60 grams  Fluid:  >/= 1.2 - 1.5L  EDUCATION NEEDS:   No education  needs identified at this time  Dionne AnoWilliam M. Tere Mcconaughey, MS, RD LDN After Hours/Weekend Pager [phone]

## 2014-11-30 NOTE — Consult Note (Signed)
Consultation Note Date: 11/30/2014   Patient Name: Michelle Bowers  DOB: 12-02-1930  MRN: [id number]  Age / Sex: 81 y.o., female  PCP: Adrian PrinceStephen South, MD Referring Physician: Edsel PetrinMaryann Mikhail, DO  Reason for Consultation: Establishing goals of care, Non pain symptom management, Pain control and Psychosocial/spiritual support    Clinical Assessment/Narrative:  81 y/o female with COPD on oxygen, chronic kidney disease, CVA with residual right-sided hemiplegia, hypertension, diabetes mellitus on insulin, severe LVH with dynamic outflow obstruction with lethargy for past 3-4 days. Previously on 2 L of O2 but for the past week has been on 4 L. Started by Dr Evlyn [place]anner[place] on Lev[place] this past Friday. CXR performed at the time did not reveal a pneumonia.   Continued physical, functional and cognitive decline.  Found to have hypothermia, positive UA and mild acute renal failure in ER. Admitted for further evaluation and stabilization  Family faced with advanced directive and anticipatory care needs   Primary Decision Maker: two daughters    HCPOA: none documented   SUMMARY OF RECOMMENDATIONS:  Continue current level of care.  Treat the treatable.  Utilize BiPap for increased WOB.  Re meet with family tomorrow at 1000 am to re-address GOC and make adjustmetns to treatment plan.  Comfort is a priority.   Code Status/Advance Care Planning: DNR    Code Status Orders        Start     Ordered   10/23/14 1549  Do not attempt resuscitation (DNR)   Continuous    Question Answer Comment  In the event of cardiac or respiratory ARREST Do not call a "code blue"   In the event of cardiac or respiratory ARREST Do not perform Intubation, CPR, defibrillation or ACLS   In the event of cardiac or respiratory ARREST Use medication by any route, position, wound care, and other measures to relive pain and suffering. May use oxygen,  suction and manual treatment of airway obstruction as needed for comfort.      10/23/14 1548      Other Directives:MOST Form introduced  Symptom Management:    Dyspnea/Pain: Morphine 1 mg IV every 1 hr prn  Agitation: Ativan 0.5 mg IV every 2 hr prn  Palliative Prophylaxis:    Bowel Regimen:  Dulcolax suppp pr qd prn   Psycho-social/Spiritual:  Support System: Strong-family support Desire for further Chaplaincy support:no Additional Recommendations: Education on Hospice and Grief/Bereavement Support  Prognosis:  Pending decisions in am  Discharge Planning: Pending   Chief Complaint/ Primary Diagnoses: Present on Admission:  . (Resolved) Altered mental state . Chronic respiratory failure with hypoxia (HCC) . HLD (hyperlipidemia) . GERD . COPD (chronic obstructive pulmonary disease) (HCC) . CKD (chronic kidney disease) stage 3, GFR 30-59 ml/min . ANEMIA . Essential hypertension . Metabolic encephalopathy . UTI (lower urinary tract infection) . Hypertrophic obstructive cardiomyopathy (HCC)  I have reviewed the medical record, interviewed the patient and family, and examined the patient. The following aspects are pertinent.  Past Medical History  Diagnosis Date  . Depression   . Hypercholesteremia   . Hypertension   . Diabetes mellitus without complication   . COPD (chronic obstructive pulmonary disease)   . Renal disorder   . Chronic kidney disease   . CHF (congestive heart failure)   . Stroke     right deficits   Social History   Social History  . Marital Status: Divorced    Spouse Name: N/A  . Number of Children: N/A  . Years of Education:  N/A   Social History Main Topics  . Smoking status: Former Smoker    Quit date: 07/06/2008  . Smokeless tobacco: Never Used  . Alcohol Use: No  . Drug Use: No  . Sexual Activity: No   Other Topics Concern  . Not on file   Social History Narrative   No family history on file. Scheduled Meds: .  amLODipine  10 mg Oral Daily  . atorvastatin  10 mg Oral Daily  . carbamide peroxide  5 drop Both Ears QHS  . cefTRIAXone (ROCEPHIN)  IV  1 g Intravenous Q24H  . DULoxetine  60 mg Oral Daily  . enoxaparin (LOVENOX) injection  30 mg Subcutaneous Q24H  . hydrALAZINE  100 mg Oral TID  . insulin aspart  0-5 Units Subcutaneous QHS  . insulin aspart  0-9 Units Subcutaneous TID WC  . mirtazapine  7.5 mg Oral QHS  . sodium chloride  3 mL Intravenous Q12H   Continuous Infusions:  PRN Meds:.hydrALAZINE, ipratropium-albuterol, LORazepam, morphine injection Medications Prior to Admission:  Prior to Admission medications   Medication Sig Start Date End Date Taking? Authorizing Provider  albuterol (PROVENTIL HFA;VENTOLIN HFA) 108 (90 BASE) MCG/ACT inhaler Inhale 2 puffs into the lungs every 6 (six) hours as needed for shortness of breath.   Yes Historical Provider, MD  ALPRAZolam (XANAX) 0.25 MG tablet Take 0.25 mg by mouth 2 (two) times daily as needed for anxiety.   Yes Historical Provider, MD  Amino Acids-Protein Hydrolys (FEEDING SUPPLEMENT, PRO-STAT SUGAR FREE 64,) LIQD Take 30 mLs by mouth 2 (two) times daily.    Yes Historical Provider, MD  amLODipine (NORVASC) 10 MG tablet Take 10 mg by mouth daily.   Yes Historical Provider, MD  atorvastatin (LIPITOR) 10 MG tablet Take 10 mg by mouth daily.   Yes Historical Provider, MD  carbamide peroxide (DEBROX) 6.5 % otic solution Place 5 drops into both ears at bedtime.   Yes Historical Provider, MD  Cholecalciferol 1000 UNITS capsule Take 1,000 Units by mouth 2 (two) times daily.   Yes Historical Provider, MD  docusate sodium (COLACE) 100 MG capsule Take 100 mg by mouth 2 (two) times daily.   Yes Historical Provider, MD  DULoxetine (CYMBALTA) 60 MG capsule Take 60 mg by mouth daily.   Yes Historical Provider, MD  ferrous sulfate 325 (65 FE) MG tablet Take 325 mg by mouth 3 (three) times daily.   Yes Historical Provider, MD  fluticasone (FLONASE) 50  MCG/ACT nasal spray Place 2 sprays into both nostrils 2 (two) times daily.    Yes Historical Provider, MD  furosemide (LASIX) 40 MG tablet Take 1 tablet (40 mg total) by mouth 2 (two) times daily. 10/09/14  Yes Hollice Espy, MD  guaifenesin (ROBITUSSIN) 100 MG/5ML syrup Take 200 mg by mouth every 4 (four) hours as needed for cough.   Yes Historical Provider, MD  hydrALAZINE (APRESOLINE) 100 MG tablet Take 100 mg by mouth 3 (three) times daily.   Yes Historical Provider, MD  insulin aspart (NOVOLOG FLEXPEN) 100 UNIT/ML FlexPen Inject 5 Units into the skin 3 (three) times daily with meals. 09/12/14  Yes Drema Dallas, MD  insulin detemir (LEVEMIR) 100 UNIT/ML injection Inject 0.08 mLs (8 Units total) into the skin 2 (two) times daily. Patient taking differently: Inject 14 Units into the skin 2 (two) times daily.  10/09/14  Yes Hollice Espy, MD  Insulin Pen Needle (AURORA PEN NEEDLES) 29G X MISC 5 Units by Does not apply  route every morning. 09/12/14  Yes Drema Dallas, MD  ipratropium-albuterol (DUONEB) 0.5-2.5 (3) MG/3ML SOLN Take 3 mLs by nebulization every 6 (six) hours as needed. For shortness of breath   Yes Historical Provider, MD  levofloxacin (LEVAQUIN) 500 MG tablet Take 500 mg by mouth daily.   Yes Historical Provider, MD  loratadine (CLARITIN) 10 MG tablet Take 10 mg by mouth daily as needed for allergies.   Yes Historical Provider, MD  mirtazapine (REMERON) 7.5 MG tablet Take 7.5 mg by mouth at bedtime.   Yes Historical Provider, MD  Multiple Vitamins-Minerals (DECUBI-VITE) CAPS Take 1 capsule by mouth daily.   Yes Historical Provider, MD  Omega-3 Fatty Acids (FISH OIL) 1000 MG CAPS Take 1 capsule by mouth 2 (two) times daily.   Yes Historical Provider, MD  omeprazole (PRILOSEC) 20 MG capsule Take 20 mg by mouth daily.   Yes Historical Provider, MD  phenol (CHLORASEPTIC) 1.4 % LIQD Use as directed 1 spray in the mouth or throat every 2 (two) hours as needed for throat irritation /  pain.   Yes Historical Provider, MD  potassium chloride (MICRO-K) 10 MEQ CR capsule Take 20 mEq by mouth every other day.    Yes Historical Provider, MD  Propylene Glycol (SYSTANE BALANCE) 0.6 % SOLN Place 1 drop into both eyes 2 (two) times daily.   Yes Historical Provider, MD  sennosides-docusate sodium (SENOKOT-S) 8.6-50 MG tablet Take 1 tablet by mouth 2 (two) times daily.   Yes Historical Provider, MD  traMADol (ULTRAM) 50 MG tablet Take 50 mg by mouth 2 (two) times daily as needed for moderate pain.   Yes Historical Provider, MD  vitamin B-12 (CYANOCOBALAMIN) 500 MCG tablet Take 500 mcg by mouth daily.   Yes Historical Provider, MD  feeding supplement, ENSURE ENLIVE, (ENSURE ENLIVE) LIQD Take 237 mLs by mouth 2 (two) times daily between meals. Patient not taking: Reported on 10/02/2014 09/12/14   Drema Dallas, MD   Allergies  Allergen Reactions  . Codeine     unknown  . Doxazosin Mesylate     unknown  . Gemfibrozil     unknown  . Rosiglitazone Maleate     REACTION: sensitivity   CBC:    Component Value Date/Time   WBC 16.5* 11/30/2014 0420   HGB 11.2* 11/30/2014 0420   HCT 35.9* 11/30/2014 0420   PLT 201 11/30/2014 0420   MCV 104.7* 11/30/2014 0420   NEUTROABS 7.2 2014-12-10 1223   LYMPHSABS 1.0 12/10/2014 1223   MONOABS 0.6 Dec 10, 2014 1223   EOSABS 0.2 12/10/2014 1223   BASOSABS 0.1 2014-12-10 1223   Comprehensive Metabolic Panel:    Component Value Date/Time   NA 143 11/30/2014 0420   K 4.5 11/30/2014 0420   CL 100* 11/30/2014 0420   CO2 33* 11/30/2014 0420   BUN 62* 11/30/2014 0420   CREATININE 1.69* 11/30/2014 0420   GLUCOSE 261* 11/30/2014 0420   CALCIUM 9.6 11/30/2014 0420   AST 14* 11/30/2014 0420   ALT 13* 11/30/2014 0420   ALKPHOS 64 11/30/2014 0420   BILITOT 0.7 11/30/2014 0420   PROT 5.2* 11/30/2014 0420   ALBUMIN 2.6* 11/30/2014 0420    Review of Systems  Constitutional: Positive for activity change, appetite change and fatigue.  HENT:        Dry buccal membranes, noexudate  Respiratory: Positive for shortness of breath.     Physical Exam  Vital Signs: BP 177/41 mmHg  Pulse 70  Temp(Src) 97.2 F (36.2 C) (Axillary)  Resp 24  Ht 5\' 2"  (1.575 m)  Wt 49.442 kg (109 lb)  BMI 19.93 kg/m2  SpO2 100% SpO2: Last BM Date:  (Prior to Arrival)  O2 Device:SpO2: 100 % O2 Flow Rate: .O2 Flow Rate (L/min): 15 L/min Intake/output summary:  Intake/Output Summary (Last 24 hours) at 11/30/14 1606 Last data filed at 11/30/14 0622  Gross per 24 hour  Intake      0 ml  Output   1450 ml  Net  -1450 ml   LBM:  BMP Latest Ref Rng 11/30/2014 2014-12-12 12/12/2014  Glucose 65 - 99 mg/dL 161(W) 960(A) 540(J)  BUN 6 - 20 mg/dL 81(X) 91(Y) 78(G)  Creatinine 0.44 - 1.00 mg/dL 9.56(O) 1.30(Q) 6.57(Q)  Sodium 135 - 145 mmol/L 143 140 139  Potassium 3.5 - 5.1 mmol/L 4.5 4.3 4.5  Chloride 101 - 111 mmol/L 100(L) 96(L) 93(L)  CO2 22 - 32 mmol/L 33(H) - 34(H)  Calcium 8.9 - 10.3 mg/dL 9.6 - 46.9    Baseline Weight: Weight: 51.12 kg (112 lb 11.2 oz) Most recent weight: Weight: 49.442 kg (109 lb)      Palliative Assessment/Data:  Flowsheet Rows        Most Recent Value   Intake Tab    Referral Department  Hospitalist   Unit at Time of Referral  Med/Surg Unit   Palliative Care Primary Diagnosis  Sepsis/Infectious Disease   Date Notified  2014-12-12   Palliative Care Type  New Palliative care   Reason for referral  Advance Care Planning   Date of Admission  2014/12/12   Date first seen by Palliative Care  11/30/14   # of days Palliative referral response time  1 Day(s)   # of days IP prior to Palliative referral  0   Clinical Assessment    Psychosocial & Spiritual Assessment    Palliative Care Outcomes       Additional Data Reviewed: Recent Labs     2014/12/12  1223  December 12, 2014  1228  11/30/14  0420  WBC  9.0   --   16.5*  HGB  13.0  15.0  11.2*  PLT  246   --   201  NA  139  140  143  BUN  64*  61*  62*  CREATININE  1.54*  1.60*   1.69*    Time In: 1500 Time Out: 1630 Time Total: 90 min Greater than 50%  of this time was spent counseling and coordinating care related to the above assessment and plan.  Discussed with Dr Alen Bleacher  Signed by: Lorinda Creed, NP  Canary Brim, NP  11/30/2014, 4:06 PM  Please contact Palliative Medicine Team phone at [phone] for questions and concerns.

## 2014-12-01 DIAGNOSIS — R0600 Dyspnea, unspecified: Secondary | ICD-10-CM

## 2014-12-01 DIAGNOSIS — Z66 Do not resuscitate: Secondary | ICD-10-CM

## 2014-12-01 DIAGNOSIS — Z515 Encounter for palliative care: Secondary | ICD-10-CM

## 2014-12-01 LAB — CBC
HEMATOCRIT: 31.6 % — AB (ref 36.0–46.0)
Hemoglobin: 10.1 g/dL — ABNORMAL LOW (ref 12.0–15.0)
MCH: 32.9 pg (ref 26.0–34.0)
MCHC: 32 g/dL (ref 30.0–36.0)
MCV: 102.9 fL — ABNORMAL HIGH (ref 78.0–100.0)
PLATELETS: 192 10*3/uL (ref 150–400)
RBC: 3.07 MIL/uL — AB (ref 3.87–5.11)
RDW: 14.2 % (ref 11.5–15.5)
WBC: 10.5 10*3/uL (ref 4.0–10.5)

## 2014-12-01 LAB — BASIC METABOLIC PANEL
ANION GAP: 10 (ref 5–15)
BUN: 73 mg/dL — AB (ref 6–20)
CHLORIDE: 97 mmol/L — AB (ref 101–111)
CO2: 33 mmol/L — AB (ref 22–32)
CREATININE: 2.25 mg/dL — AB (ref 0.44–1.00)
Calcium: 8.8 mg/dL — ABNORMAL LOW (ref 8.9–10.3)
GFR calc non Af Amer: 19 mL/min — ABNORMAL LOW (ref >60)
GFR, EST AFRICAN AMERICAN: 22 mL/min — AB (ref >60)
GLUCOSE: 104 mg/dL — AB (ref 65–99)
Potassium: 4.4 mmol/L (ref 3.5–5.1)
Sodium: 140 mmol/L (ref 135–145)

## 2014-12-01 LAB — GLUCOSE, CAPILLARY
GLUCOSE-CAPILLARY: 143 mg/dL — AB (ref 65–99)
GLUCOSE-CAPILLARY: 96 mg/dL (ref 65–99)
Glucose-Capillary: 217 mg/dL — ABNORMAL HIGH (ref 65–99)

## 2014-12-01 LAB — URINE CULTURE: Culture: >=100000

## 2014-12-01 LAB — CULTURE, BLOOD (ROUTINE X 2)

## 2014-12-01 MED ORDER — SODIUM CHLORIDE 0.9 % IV SOLN: INTRAVENOUS | Status: DC

## 2014-12-01 MED ORDER — ENSURE ENLIVE PO LIQD
237.0000 mL | Freq: Two times a day (BID) | ORAL | Status: DC
  Administered 2014-12-01: 237 mL via ORAL

## 2014-12-01 MED ORDER — BISACODYL 10 MG RE SUPP: 10.0000 mg | Freq: Every day | RECTAL | Status: DC | PRN

## 2014-12-01 MED ORDER — MORPHINE SULFATE 25 MG/ML IV SOLN
10.0000 mg/h | INTRAVENOUS | Status: DC
  Administered 2014-12-03: 1 mg/h via INTRAVENOUS
  Administered 2014-12-03: 4 mg/h via INTRAVENOUS
  Administered 2014-12-03: 3 mg/h via INTRAVENOUS
  Administered 2014-12-03 (×3): 1 mg/h via INTRAVENOUS
  Administered 2014-12-04 (×2): 8 mg/h via INTRAVENOUS
  Administered 2014-12-04: 8.5 mg/h via INTRAVENOUS
  Filled 2014-12-01: qty 10

## 2014-12-01 MED ORDER — INSULIN ASPART 100 UNIT/ML ~~LOC~~ SOLN
0.0000 [IU] | SUBCUTANEOUS | Status: DC
  Administered 2014-12-01: 1 [IU] via SUBCUTANEOUS

## 2014-12-01 MED ORDER — LORAZEPAM 2 MG/ML IJ SOLN
1.0000 mg | INTRAMUSCULAR | Status: DC | PRN
  Administered 2014-12-01 – 2014-12-03 (×3): 1 mg via INTRAVENOUS
  Filled 2014-12-01 (×3): qty 1

## 2014-12-01 MED ORDER — MORPHINE SULFATE 25 MG/ML IV SOLN
1.0000 mg/h | INTRAVENOUS | Status: DC
  Administered 2014-12-01: 1 mg/h via INTRAVENOUS
  Filled 2014-12-01: qty 10

## 2014-12-01 NOTE — Progress Notes (Signed)
Family had stated they wanted to speak with pt-RT weaned pt to 4L [place]-pt tol well. RN aware

## 2014-12-01 NOTE — Progress Notes (Signed)
Family running out of the room saying that pt's saturation is 84 %. Went to room & reassured pt and family Pt repositioned Hob Up At 1240 given 1 mg of Morphine IV. Sats right back to 94% after morphine Pt taking sips of Ensure had some applesauce.

## 2014-12-01 NOTE — Progress Notes (Signed)
Palliative care called to see how and see how pt is doing. See previous notes. Family very anxious Pt getting hourly MS IV and some Ativan see MAR. Palliative care NP here talking with family now

## 2014-12-01 NOTE — Progress Notes (Signed)
Air hungry - sats dropped to 75 %  placed on 100% nonrebreather mask  RR 34 Given 1 mg of IV Ativan family at bedside

## 2014-12-01 NOTE — Progress Notes (Addendum)
Pt transferred to 6 North Bed 16 Per bed with all belongings. Pt has dentures in a cup labeled with name. Family notified per cell phone - left message with Camelia Engerri that her mother had been transferred & given new phone # for [address] [place]

## 2014-12-01 NOTE — Progress Notes (Signed)
Pt started on morphine drip at 1 mg/hr for comfort. Monitor off still on 100 % nonrebreather oxygen. Family at bedside Made aware that she is on maximum amount of oxygen - looks more comfortable now .

## 2014-12-01 NOTE — Care Management Important Message (Signed)
Important Message  Patient Details  Name: Michelle Bowers MRN: [id number] Date of Birth: [birth year]   Medicare Important Message Given:  Yes-second notification given    Kyla BalzarineShealy, Marketa Midkiff Abena 12/01/2014, 11:41 AM

## 2014-12-01 NOTE — Progress Notes (Signed)
Daily Progress Note   Patient Name: Michelle Bowers       Date: 12/01/2014 DOB: 02/01/1931  Age: 81 y.o. MRN#: [id number] Attending Physician: [name], DO Primary Care Physician: Julian Hy, MD Admit Date: 2014/12/22  Reason for Consultation/Follow-up: Establishing goals of care, Non pain symptom management, Pain control, Psychosocial/spiritual support and Terminal care  Subjective:      -continued conversation with family at bedside regarding  Diagnosis, prognosis, GOC, EOL wishes disposition and options.  -continued discussion  regarding advanced directives.  Concepts specific to code status, artifical feeding and hydration, continued IV antibiotics and rehospitalization was had.  The difference between a aggressive medical intervention path  and a palliative comfort care path for this patient at this time was had.  Values and goals of care important to patient and family were attempted to be elicited.  Concept of Hospice and Palliative Care was incorporated in discussion   Natural trajectory and expectations at EOL were discussed.  Questions and concerns addressed.    PMT will continue to support holistically.    Length of Stay: 2 days  Current Medications: Scheduled Meds:  . amLODipine  10 mg Oral Daily  . atorvastatin  10 mg Oral Daily  . carbamide peroxide  5 drop Both Ears QHS  . cefTRIAXone (ROCEPHIN)  IV  1 g Intravenous Q24H  . DULoxetine  60 mg Oral Daily  . enoxaparin (LOVENOX) injection  30 mg Subcutaneous Q24H  . hydrALAZINE  100 mg Oral TID  . insulin aspart  0-9 Units Subcutaneous Q4H  . mirtazapine  7.5 mg Oral QHS  . sodium chloride  3 mL Intravenous Q12H  . vancomycin  500 mg Intravenous Q48H    Continuous Infusions:    PRN Meds: bisacodyl, hydrALAZINE, ipratropium-albuterol, LORazepam, morphine injection  Palliative Performance Scale: 30% at best     Vital Signs: BP 130/25 mmHg  Pulse 78  Temp(Src) 97.6 F (36.4 C)  (Axillary)  Resp 26  Ht  (1.575 m)  Wt 48.444 kg (106 lb 12.8 oz)  BMI 19.53 kg/m2  SpO2 100% SpO2: SpO2: 100 % O2 Device: O2 Device: Nasal Cannula O2 Flow Rate: O2 Flow Rate (L/min): 4 L/min  Intake/output summary:  Intake/Output Summary (Last 24 hours) at 12/01/14 1028 Last data filed at 12/01/14 0500  Gross per 24 hour  Intake    100 ml  Output    350 ml  Net   -250 ml   LBM: Last BM Date:  (Prior to Arrival) Baseline Weight: Weight: 51.12 kg (112 lb 11.2 oz) Most recent weight: Weight: 48.444 kg (106 lb 12.8 oz)  Physical Exam:              General: ill appearing, letahrgic HEENT: dry buccal membranes, no exudate CVS: RRR Resp: decreased throughout all lung fields Abd:  soft NT +BS Skin: warm and dry Neuro: minimally responsive to gentle touch and verbal stimuli   Additional Data Reviewed: Recent Labs     11/30/14  0420  12/01/14  0730  WBC  16.5*  10.5  HGB  11.2*  10.1*  PLT  201  192  NA  143  140  BUN  62*  73*  CREATININE  1.69*  2.25*     Problem List:  Patient Active Problem List   Diagnosis Date Noted  . Palliative care encounter 12/01/2014  . Dyspnea 12/01/2014  . DNR (do not resuscitate) 12/01/2014  . Metabolic encephalopathy Dec 22, 2014  . UTI (lower urinary tract  infection) 06/08/14  . Hypertrophic obstructive cardiomyopathy (HCC) 06/08/14  . AKI (acute kidney injury) (HCC)   . Lethargy   . Encounter for palliative care   . CKD (chronic kidney disease) stage 3, GFR 30-59 ml/min 10/04/2014  . Chronic respiratory failure with hypoxia (HCC)   . Tobacco abuse   . External carotid artery stenosis   . HLD (hyperlipidemia)   . Anxiety state   . History of arterial ischemic stroke   . Pneumobilia 09/03/2014  . Diabetes mellitus with insulin therapy (HCC)   . COLITIS 10/16/2009  . ANEMIA 08/08/2009  . COPD (chronic obstructive pulmonary disease) (HCC) 08/08/2009  . DEGENERATIVE JOINT DISEASE 08/08/2009  . ADRENAL MASS, LEFT  03/26/2007  . Essential hypertension 03/26/2007  . ESOPHAGEAL STRICTURE 03/26/2007  . GERD 03/26/2007  . DUODENITIS, WITHOUT HEMORRHAGE 03/26/2007  . ANGIODYSPLASIA OF INTESTINE 03/26/2007  . OSTEOPOROSIS 03/26/2007  . ARTERIOVENOUS MALFORMATION, CECUM 03/26/2007     Palliative Care Assessment & Plan    Code Status:  DNR   BiPap: no further Bi Pap intervetnion  Goals of Care:  Comfort is the priority of care.  Minimize medications, however family wish to continue IV antibiotics.  No further diagnostics or life prolonging intervetnions     Symptom Management:  Dyspnea/Pain: Morphine 1 mg IV every 1 hr prn, if unable to control symptoms consider continuous gtt -at 1500 re accessed patient and is needing frequent prn to achieve comfort, will initiate a morphine gtt  Agitation: Ativan 1 mg IV every 4 hrs prn    Prognosis: Hours - Days  5. Discharge Planning: expect hospital death   Care plan was discussed with Dr Catha GosselinMikhail  Thank you for allowing the Palliative Medicine Team to assist in the care of this patient.   Time In: 1000 Time Out: 1100 Total Time 60 min Prolonged Time Billed  no     Greater than 50%  of this time was spent counseling and coordinating care related to the above assessment and plan.     [name], NP  12/01/2014, 10:28 AM  Please contact Palliative Medicine Team phone at [phone] for questions and concerns.

## 2014-12-01 NOTE — Progress Notes (Signed)
[place] called to [facility] [place] spoke to Jabil CircuitJoan RN

## 2014-12-01 NOTE — Clinical Social Work Note (Signed)
Clinical Social Work Assessment  Patient Details  Name: Michelle Bowers MRN: [id number] Date of Birth: [birth year]  Date of referral:  12/01/14               Reason for consult:  Facility Placement                Permission sought to share information with:  Family Supports Permission granted to share information::     Name::     Oceanographerancy  Agency::  [facility]  Relationship::  daughter  SolicitorContact Information:     Housing/Transportation Living arrangements for the past 2 months:  [facility] Building surveyorursing Facility Source of Information:  Adult Children Patient Interpreter Needed:  None Criminal Activity/Legal Involvement Pertinent to Current Situation/Hospitalization:  No - Comment as needed Significant Relationships:  Adult Children Lives with:    Do you feel safe going back to the place where you live?  Yes Need for family participation in patient care:  Yes (Comment) (decision making)  Care giving concerns:  None at this time- pt might be more appropriate for hospice per palliative notes   Social Worker assessment / plan: CSW spoke with pt family concerning return to [facility] if pt is able to return to SNF level of care  Employment status:  Retired Database administratornsurance information:  Managed Medicare PT Recommendations:  Not assessed at this time Information / Referral to community resources:     Patient/Family's Response to care:  Pt family is agreeable to return to [facility] if that is what is appropriate for patient  Patient/Family's Understanding of and Emotional Response to Diagnosis, Current Treatment, and Prognosis:  No questions or concerns at this time  Emotional Assessment Appearance:  Appears stated age Attitude/Demeanor/Rapport:  Unable to Assess Affect (typically observed):  Unable to Assess Orientation:    Alcohol / Substance use:  Not Applicable Psych involvement (Current and /or in the community):  No (Comment)  Discharge Needs  Concerns to be addressed:  Care  Coordination Readmission within the last 30 days:  No Current discharge risk:    Barriers to Discharge:  Continued Medical Work up   Michelle Bowers, Michelle Mcgough M, LCSW 12/01/2014, 12:04 PM

## 2014-12-01 NOTE — Progress Notes (Signed)
[facility]                                                                              Patient Demographics  Michelle Bowers, is a 81 y.o. female, DOB - 27-Jan-1931, WUJ:[id number]  Admit date - 11/23/2014   Admitting Physician Calvert CantorSaima Rizwan, MD  Outpatient Primary MD for the patient is Julian HySOUTH,STEPHEN ALAN, MD  LOS - 2   Chief Complaint  Patient presents with  . Altered Mental Status      HPI on 11/30/2014 by Ms. Junious SilkAllison Ellis, NP This is an 81 year old female patient with a history of chronic hypoxemia and COPD on 4 liters oxygen at baseline, HOCM with chronic recurrent pulmonary edema, chronic kidney disease, history of chronic microvascular ischemia, history of left external capsule and periventricular white matter stroke in 2010, diabetes on insulin, hypertension, anxiety and depression, dyslipidemia who was sent to the ER from skilled nursing facility secondary to altered mentation. Patient apparently was nonverbal at presentation but was able to convey to the ER staff that she was having headache and chest pain but no abdominal pain.  Upon arrival to the ER the patient was hypothermic with a rectal temperature of 96.3 prompting placement of warming blanket, patient was hypertensive with a blood pressure 179/65, heart rate was stable at 85 and she was maintaining sinus rhythm, she was on 2 L nasal cannula oxygen with sats 99%. Chest x-ray showed stable mild cardiomegaly and mild pulmonary edema with a stable small right pleural effusion and mild right basilar atelectasis. CT of the head was unremarkable. Electrolyte panel was normal except for chloride 96, BUN 61, creatinine 1.6 and elevated CO2 of 34. Glucose was slightly elevated to 17, LFTs were normal, troponin was normal, lactic acid was normal. CBC was normal except for elevated MCV of 103. PT/INR was normal. Urinalysis was abnormal and concerning for UTI with hazy appearance, many bacteria, hyaline casts, positive nitrite, 0-2  WBCs. Blood cultures and urine cultures were obtained in the ER. Because of presentation with hypothermia and altered mentation with apparent infectious source i.e. UTI patient was treated as possible sepsis despite normal lactic acid. She was given IV antibiotics (Rocephin) and because of her history of obstructive cardiomyopathy with rapid transition to heart failure she has been given small volume fluid challenges totaling 1 L since arrival. With the above treatments patient's mentation has improved noting she now awakens and attempts to verbally communicate noting this is limited by her hard of hearing status. Unfortunately she continues to drift off to sleep easily and remains tachypnea.  In talking with the daughters at the bedside, the patient's physician at the nursing facility had started her on Levaquin for presumed pneumonia/ possible UTI on 10/14. She also was being treated with Debrox for cerumen impaction.  Since my initial discussion with the patient's family my attending has evaluated the patient and all the family has decided to make the patient a DO NOT RESUSCITATE.  Assessment & Plan   Acute on chronic respiratory failure with hypoxia/ COPD -Patient continues to need bipap, will attempt to wean -Continue morphine and ativan PRN -Palliatve care consulted and appreciated, additional family meeting planned for today  UTI  -  UA consistent with UTI however WBCs normal, patient was started on Levaquin as an outpatient 11/24/2014 -Urine culture >100K Ecoli -Blood Culture show no growth to date -continue ceftriaxone  Bacteremia, GPC  -Blood cultures show 1/2 GPC -Started on vanc -Will continue to follow, likely contaminant   Hypertrophic obstructive cardiomyopathy -Heart failure appears to be compensated however Lasix currently held -Opinion monitor closely  Chronic anemia -Baseline hemoglobin appears 10-11, Hb currently 10.1 -Continue monitor CBC  Dehydration -Evident on  labs as patient appeared to be hemoconcentrated  -secondary to poor oral intake   Diabetes mellitus, type II -Levemir held, continue insulin sliding scale with CBG monitoring  Acute on Chronic kidney disease, stage III -baseline Cr 1.2-1.3, currently 2.25 -Continue to monitor BMP -Lasix held  GERD -Continue PPI  Hyperlipidemia -Continue statin  History of CVA  Goals of care -Spoke with family at bedside regarding comfort care, they would like to transition patient but would like to continue BiPAP and antibiotics. -Continue PRN morphine and ativan -Patient's family would like to meet with palliative care- pending further conversation regarding comfort care  Code Status: DNR  Family Communication: None at bedside  Disposition Plan: Admitted,Continue to monitor in Step down  Time Spent in minutes   30 minutes  Procedures  None  Consults   Palliative care  DVT Prophylaxis  lovenox  Lab Results  Component Value Date   PLT 192 12/01/2014    Medications  Scheduled Meds: . amLODipine  10 mg Oral Daily  . atorvastatin  10 mg Oral Daily  . carbamide peroxide  5 drop Both Ears QHS  . cefTRIAXone (ROCEPHIN)  IV  1 g Intravenous Q24H  . DULoxetine  60 mg Oral Daily  . enoxaparin (LOVENOX) injection  30 mg Subcutaneous Q24H  . hydrALAZINE  100 mg Oral TID  . insulin aspart  0-9 Units Subcutaneous Q4H  . mirtazapine  7.5 mg Oral QHS  . sodium chloride  3 mL Intravenous Q12H  . vancomycin  500 mg Intravenous Q48H   Continuous Infusions:  PRN Meds:.bisacodyl, hydrALAZINE, ipratropium-albuterol, LORazepam, morphine injection  Antibiotics   Anti-infectives    Start     Dose/Rate Route Frequency Ordered Stop   11/30/14 1800  vancomycin (VANCOCIN) 500 mg in sodium chloride 0.9 % 100 mL IVPB     500 mg 100 mL/hr over 60 Minutes Intravenous Every 48 hours 11/30/14 1752     12/10/2014 1400  cefTRIAXone (ROCEPHIN) 1 g in dextrose 5 % 50 mL IVPB     1 g 100 mL/hr over 30  Minutes Intravenous Every 24 hours 12/02/2014 1347       Subjective:   Michelle Bowers seen and examined today.  Patient currently wearing Bipap, not responsive.   Objective:   Filed Vitals:   12/01/14 0400 12/01/14 0500 12/01/14 0803 12/01/14 0905  BP: 128/40  135/43 130/25  Pulse: 74  70 78  Temp: 98.2 F (36.8 C)  97.6 F (36.4 C)   TempSrc: Axillary  Axillary   Resp: 20  24 26   Height:      Weight:  48.444 kg (106 lb 12.8 oz)    SpO2: 100%  100% 100%    Wt Readings from Last 3 Encounters:  12/01/14 48.444 kg (106 lb 12.8 oz)  10/09/14 51.3 kg (113 lb 1.5 oz)  09/11/14 56.1 kg (123 lb 10.9 oz)     Intake/Output Summary (Last 24 hours) at 12/01/14 1040 Last data filed at 12/01/14 0500  Gross per 24 hour  Intake    100 ml  Output    350 ml  Net   -250 ml    Exam  General: Well developed, thin, elderly  HEENT: NCAT, Bipap in place  Cardiovascular: S1 S2 auscultated, RRR  Respiratory: Diminished but clear (anteriorly)  Abdomen: Soft, nontender, nondistended, + bowel sounds  Extremities: warm dry without cyanosis clubbing or edema  Data Review   Micro Results Recent Results (from the past 240 hour(s))  Blood culture (routine x 2)     Status: None (Preliminary result)   Collection Time: 12/14/14 12:04 PM  Result Value Ref Range Status   Specimen Description BLOOD RIGHT ANTECUBITAL  Final   Special Requests BOTTLES DRAWN AEROBIC ONLY 2CC  Final   Culture  Setup Time   Final    GRAM POSITIVE COCCI IN CLUSTERS CRITICAL RESULT CALLED TO, READ BACK BY AND VERIFIED WITH: ZIVEREIGO RN 17:15 11/30/14 ([email or web address])    Culture NO GROWTH 1 DAY  Final   Report Status PENDING  Incomplete  Blood culture (routine x 2)     Status: None (Preliminary result)   Collection Time: 12-14-2014 12:15 PM  Result Value Ref Range Status   Specimen Description BLOOD LEFT HAND  Final   Special Requests BOTTLES DRAWN AEROBIC AND ANAEROBIC 5CC  Final   Culture NO GROWTH 1 DAY  Final    Report Status PENDING  Incomplete  Urine culture     Status: None   Collection Time: 12-14-2014 12:45 PM  Result Value Ref Range Status   Specimen Description URINE, CATHETERIZED  Final   Special Requests NONE  Final   Culture >=100,000 COLONIES/mL ESCHERICHIA COLI  Final   Report Status 12/01/2014 FINAL  Final   Organism ID, Bacteria ESCHERICHIA COLI  Final      Susceptibility   Escherichia coli - MIC*    AMPICILLIN <=2 SENSITIVE Sensitive     CEFAZOLIN <=4 SENSITIVE Sensitive     CEFTRIAXONE <=1 SENSITIVE Sensitive     CIPROFLOXACIN >=4 RESISTANT Resistant     GENTAMICIN <=1 SENSITIVE Sensitive     IMIPENEM <=0.25 SENSITIVE Sensitive     NITROFURANTOIN <=16 SENSITIVE Sensitive     TRIMETH/SULFA <=20 SENSITIVE Sensitive     AMPICILLIN/SULBACTAM <=2 SENSITIVE Sensitive     PIP/TAZO <=4 SENSITIVE Sensitive     * >=100,000 COLONIES/mL ESCHERICHIA COLI  MRSA PCR Screening     Status: None   Collection Time: 11/30/14 11:55 AM  Result Value Ref Range Status   MRSA by PCR NEGATIVE NEGATIVE Final    Comment:        The GeneXpert MRSA Assay (FDA approved for NASAL specimens only), is one component of a comprehensive MRSA colonization surveillance program. It is not intended to diagnose MRSA infection nor to guide or monitor treatment for MRSA infections.     Radiology Reports Ct Head Wo Contrast  12-14-2014  CLINICAL DATA:  Altered mental status. History of CVA with right-sided deficits. EXAM: CT HEAD WITHOUT CONTRAST TECHNIQUE: Contiguous axial images were obtained from the base of the skull through the vertex without intravenous contrast. COMPARISON:  09/03/2014 head CT. FINDINGS: No evidence of parenchymal hemorrhage or extra-axial fluid collection. No mass lesion, mass effect, or midline shift. No CT evidence of acute infarction. Stable remote infarcts in the bilateral thalami and right cerebellar hemisphere. Stable prominent intracranial atherosclerosis, diffuse cerebral volume  loss and extensive periventricular and subcortical white matter hypodensity, likely reflecting chronic small vessel ischemic change. Stable size of the cerebral ventricles, which  are concordant with the degree of cerebral volume loss. The visualized paranasal sinuses are essentially clear. The mastoid air cells are unopacified. No evidence of calvarial fracture. IMPRESSION: 1.  No evidence of acute intracranial abnormality. 2. Stable intracranial atherosclerosis, bilateral thalamic and right cerebellar hemisphere remote infarcts, diffuse cerebral volume loss and extensive chronic small vessel ischemic white matter change. Electronically Signed   By: Delbert Phenix M.D.   On: 11/16/2014 13:23   Dg Chest Port 1 View  12/11/2014  CLINICAL DATA:  Altered mental status.  COPD.  CHF. EXAM: PORTABLE CHEST 1 VIEW COMPARISON:  10/04/2014 chest radiograph FINDINGS: Low lung volumes. Stable mild to moderate elevation of the right hemidiaphragm. Stable cardiomediastinal silhouette with mild cardiomegaly. No pneumothorax. Stable small right pleural effusion. No left pleural effusion. Mild pulmonary edema. Curvilinear opacities at the right lung base, in keeping with mild right basilar atelectasis. Bilateral breast prostheses with calcified capsules are again noted. IMPRESSION: 1. Stable mild cardiomegaly with mild pulmonary edema, in keeping with mild congestive heart failure. 2. Stable small right pleural effusion. 3. Mild right basilar atelectasis. Electronically Signed   By: Delbert Phenix M.D.   On: 11/22/2014 12:16    CBC  Recent Labs Lab 11/25/2014 1223 12/11/2014 1228 11/30/14 0420 12/01/14 0730  WBC 9.0  --  16.5* 10.5  HGB 13.0 15.0 11.2* 10.1*  HCT 41.1 44.0 35.9* 31.6*  PLT 246  --  201 192  MCV 103.0*  --  104.7* 102.9*  MCH 32.6  --  32.7 32.9  MCHC 31.6  --  31.2 32.0  RDW 13.8  --  14.1 14.2  LYMPHSABS 1.0  --   --   --   MONOABS 0.6  --   --   --   EOSABS 0.2  --   --   --   BASOSABS 0.1  --    --   --     Chemistries   Recent Labs Lab 12/05/2014 1223 11/28/2014 1228 11/30/14 0420 12/01/14 0730  NA 139 140 143 140  K 4.5 4.3 4.5 4.4  CL 93* 96* 100* 97*  CO2 34*  --  33* 33*  GLUCOSE 211* 217* 261* 104*  BUN 64* 61* 62* 73*  CREATININE 1.54* 1.60* 1.69* 2.25*  CALCIUM 10.2  --  9.6 8.8*  AST 15  --  14*  --   ALT 12*  --  13*  --   ALKPHOS 75  --  64  --   BILITOT 0.5  --  0.7  --    ------------------------------------------------------------------------------------------------------------------ estimated creatinine clearance is 14.7 mL/min (by C-G formula based on Cr of 2.25). ------------------------------------------------------------------------------------------------------------------ No results for input(s): HGBA1C in the last 72 hours. ------------------------------------------------------------------------------------------------------------------ No results for input(s): CHOL, HDL, LDLCALC, TRIG, CHOLHDL, LDLDIRECT in the last 72 hours. ------------------------------------------------------------------------------------------------------------------ No results for input(s): TSH, T4TOTAL, T3FREE, THYROIDAB in the last 72 hours.  Invalid input(s): FREET3 ------------------------------------------------------------------------------------------------------------------ No results for input(s): VITAMINB12, FOLATE, FERRITIN, TIBC, IRON, RETICCTPCT in the last 72 hours.  Coagulation profile  Recent Labs Lab 12/08/2014 1223  INR 0.96    No results for input(s): DDIMER in the last 72 hours.  Cardiac Enzymes No results for input(s): CKMB, TROPONINI, MYOGLOBIN in the last 168 hours.  Invalid input(s): CK ------------------------------------------------------------------------------------------------------------------ Invalid input(s): POCBNP    Cherokee Boccio D.O. on 12/01/2014 at 10:40 AM  Between 7am to 7pm - Pager - [phone]  After 7pm go to  [email or web address] - password [email or web address]  And look for the night coverage person  covering for me after hours  [facility] Office  [phone]

## 2014-12-01 NOTE — Progress Notes (Signed)
RT removed Bipap Pt placed on 4 L n/c. RR 30. Opening eyes not talking but responsive & following some simple commands

## 2014-12-02 LAB — GLUCOSE, CAPILLARY: Glucose-Capillary: 188 mg/dL — ABNORMAL HIGH (ref 65–99)

## 2014-12-02 NOTE — Progress Notes (Signed)
[facility]                                                                              Patient Demographics  Michelle Bowers, is a 81 y.o. female, DOB - 07-17-1930, ZOX:[id number]  Admit date - 11/11/2014   Admitting Physician Calvert Cantor, MD  Outpatient Primary MD for the patient is Julian Hy, MD  LOS - 3   Chief Complaint  Patient presents with  . Altered Mental Status      HPI on 12/07/2014 by Ms. Michelle Silk, NP This is an 81 year old female patient with a history of chronic hypoxemia and COPD on 4 liters oxygen at baseline, HOCM with chronic recurrent pulmonary edema, chronic kidney disease, history of chronic microvascular ischemia, history of left external capsule and periventricular white matter stroke in 2010, diabetes on insulin, hypertension, anxiety and depression, dyslipidemia who was sent to the ER from skilled nursing facility secondary to altered mentation. Patient apparently was nonverbal at presentation but was able to convey to the ER staff that she was having headache and chest pain but no abdominal pain.  Upon arrival to the ER the patient was hypothermic with a rectal temperature of 96.3 prompting placement of warming blanket, patient was hypertensive with a blood pressure 179/65, heart rate was stable at 85 and she was maintaining sinus rhythm, she was on 2 L nasal cannula oxygen with sats 99%. Chest x-ray showed stable mild cardiomegaly and mild pulmonary edema with a stable small right pleural effusion and mild right basilar atelectasis. CT of the head was unremarkable. Electrolyte panel was normal except for chloride 96, BUN 61, creatinine 1.6 and elevated CO2 of 34. Glucose was slightly elevated to 17, LFTs were normal, troponin was normal, lactic acid was normal. CBC was normal except for elevated MCV of 103. PT/INR was normal. Urinalysis was abnormal and concerning for UTI with hazy appearance, many bacteria, hyaline casts, positive nitrite, 0-2  WBCs. Blood cultures and urine cultures were obtained in the ER. Because of presentation with hypothermia and altered mentation with apparent infectious source i.e. UTI patient was treated as possible sepsis despite normal lactic acid. She was given IV antibiotics (Rocephin) and because of her history of obstructive cardiomyopathy with rapid transition to heart failure she has been given small volume fluid challenges totaling 1 L since arrival. With the above treatments patient's mentation has improved noting she now awakens and attempts to verbally communicate noting this is limited by her hard of hearing status. Unfortunately she continues to drift off to sleep easily and remains tachypnea.  In talking with the daughters at the bedside, the patient's physician at the nursing facility had started her on Levaquin for presumed pneumonia/ possible UTI on 10/14. She also was being treated with Debrox for cerumen impaction.  Since my initial discussion with the patient's family my attending has evaluated the patient and all the family has decided to make the patient a DO NOT RESUSCITATE.  Assessment & Plan   Acute on chronic respiratory failure with hypoxia/ COPD -Patient did need bipap, however, family agreed to discontinue.  Patient now transitioned to comfort care.  Continue morphine drip and PRN ativan -Palliatve care consulted and  appreciated -Currently on NRB  UTI  -UA consistent with UTI however WBCs normal, patient was started on Levaquin as an outpatient 11/24/2014 -Urine culture >100K Ecoli -Blood Culture show no growth to date -continue ceftriaxone  Bacteremia, GPC  -Blood cultures show 1/2 GPC--> SCN -was started on vancomycin, discontinued as SCN likely contaminant   Hypertrophic obstructive cardiomyopathy -Heart failure appears to be compensated however Lasix currently held  Chronic anemia -Baseline hemoglobin appears 10-11  Dehydration -Evident on labs as patient appeared to  be hemoconcentrated  -secondary to poor oral intake   Diabetes mellitus, type II -have discontinued meds/CBG monitoring due to comfort care  Acute on Chronic kidney disease, stage III -baseline Cr 1.2-1.3, last Cr 2.25 -Lasix held  GERD -Continue PPI  Hyperlipidemia -Continue statin  History of CVA  Goals of care -Palliative care consult appreciated. Patient transitioned to comfort care. Currently on morphine drip as well as PRN Ativan. -Have discontinued BiPAP.  Code Status: DNR  Family Communication: None at bedside  Disposition Plan: Admitted,Continue to monitor in Step down  Time Spent in minutes   30 minutes  Procedures  None  Consults   Palliative care  DVT Prophylaxis  None- comfort care  Lab Results  Component Value Date   PLT 192 12/01/2014    Medications  Scheduled Meds: . carbamide peroxide  5 drop Both Ears QHS  . cefTRIAXone (ROCEPHIN)  IV  1 g Intravenous Q24H  . feeding supplement (ENSURE ENLIVE)  237 mL Oral BID BM  . sodium chloride  3 mL Intravenous Q12H   Continuous Infusions: . sodium chloride 10 mL/hr (12/01/14 1107)  . morphine     PRN Meds:.bisacodyl, ipratropium-albuterol, LORazepam, morphine injection  Antibiotics   Anti-infectives    Start     Dose/Rate Route Frequency Ordered Stop   11/30/14 1800  vancomycin (VANCOCIN) 500 mg in sodium chloride 0.9 % 100 mL IVPB  Status:  Discontinued     500 mg 100 mL/hr over 60 Minutes Intravenous Every 48 hours 11/30/14 1752 12/01/14 1341   12/05/2014 1400  cefTRIAXone (ROCEPHIN) 1 g in dextrose 5 % 50 mL IVPB     1 g 100 mL/hr over 30 Minutes Intravenous Every 24 hours 12/05/2014 1347       Subjective:   Michelle Bowers seen and examined today.  Patient currently on NRB.    Objective:   Filed Vitals:   12/01/14 0500 12/01/14 0803 12/01/14 0905 12/01/14 1308  BP:  135/43 130/25 126/74  Pulse:  70 78 82  Temp:  97.6 F (36.4 C)  97.6 F (36.4 C)  TempSrc:  Axillary  Axillary    Resp:  24 26 31   Height:      Weight: 48.444 kg (106 lb 12.8 oz)     SpO2:  100% 100% 97%    Wt Readings from Last 3 Encounters:  12/01/14 48.444 kg (106 lb 12.8 oz)  10/09/14 51.3 kg (113 lb 1.5 oz)  09/11/14 56.1 kg (123 lb 10.9 oz)     Intake/Output Summary (Last 24 hours) at 12/02/14 1115 Last data filed at 12/02/14 0858  Gross per 24 hour  Intake    432 ml  Output    450 ml  Net    -18 ml    Exam  General: Well developed, thin, elderly  HEENT: NCAT  Cardiovascular: S1 S2 auscultated, RRR  Respiratory: Diminished but clear (anteriorly)  Data Review   Micro Results Recent Results (from the past 240 hour(s))  Blood culture (  routine x 2)     Status: None   Collection Time: December 01, 2014 12:04 PM  Result Value Ref Range Status   Specimen Description BLOOD RIGHT ANTECUBITAL  Final   Special Requests BOTTLES DRAWN AEROBIC ONLY 2CC  Final   Culture  Setup Time   Final    GRAM POSITIVE COCCI IN CLUSTERS CRITICAL RESULT CALLED TO, READ BACK BY AND VERIFIED WITH: ZIVEREIGO RN 17:15 11/30/14 ([email or web address])    Culture   Final    STAPHYLOCOCCUS SPECIES (COAGULASE NEGATIVE) THE SIGNIFICANCE OF ISOLATING THIS ORGANISM FROM A SINGLE SET OF BLOOD CULTURES WHEN MULTIPLE SETS ARE DRAWN IS UNCERTAIN. PLEASE NOTIFY THE MICROBIOLOGY DEPARTMENT WITHIN ONE WEEK IF SPECIATION AND SENSITIVITIES ARE REQUIRED.    Report Status 12/01/2014 FINAL  Final  Blood culture (routine x 2)     Status: None (Preliminary result)   Collection Time: 12-01-2014 12:15 PM  Result Value Ref Range Status   Specimen Description BLOOD LEFT HAND  Final   Special Requests BOTTLES DRAWN AEROBIC AND ANAEROBIC 5CC  Final   Culture NO GROWTH 3 DAYS  Final   Report Status PENDING  Incomplete  Urine culture     Status: None   Collection Time: December 01, 2014 12:45 PM  Result Value Ref Range Status   Specimen Description URINE, CATHETERIZED  Final   Special Requests NONE  Final   Culture >=100,000 COLONIES/mL ESCHERICHIA COLI   Final   Report Status 12/01/2014 FINAL  Final   Organism ID, Bacteria ESCHERICHIA COLI  Final      Susceptibility   Escherichia coli - MIC*    AMPICILLIN <=2 SENSITIVE Sensitive     CEFAZOLIN <=4 SENSITIVE Sensitive     CEFTRIAXONE <=1 SENSITIVE Sensitive     CIPROFLOXACIN >=4 RESISTANT Resistant     GENTAMICIN <=1 SENSITIVE Sensitive     IMIPENEM <=0.25 SENSITIVE Sensitive     NITROFURANTOIN <=16 SENSITIVE Sensitive     TRIMETH/SULFA <=20 SENSITIVE Sensitive     AMPICILLIN/SULBACTAM <=2 SENSITIVE Sensitive     PIP/TAZO <=4 SENSITIVE Sensitive     * >=100,000 COLONIES/mL ESCHERICHIA COLI  MRSA PCR Screening     Status: None   Collection Time: 11/30/14 11:55 AM  Result Value Ref Range Status   MRSA by PCR NEGATIVE NEGATIVE Final    Comment:        The GeneXpert MRSA Assay (FDA approved for NASAL specimens only), is one component of a comprehensive MRSA colonization surveillance program. It is not intended to diagnose MRSA infection nor to guide or monitor treatment for MRSA infections.     Radiology Reports Ct Head Wo Contrast  December 01, 2014  CLINICAL DATA:  Altered mental status. History of CVA with right-sided deficits. EXAM: CT HEAD WITHOUT CONTRAST TECHNIQUE: Contiguous axial images were obtained from the base of the skull through the vertex without intravenous contrast. COMPARISON:  09/03/2014 head CT. FINDINGS: No evidence of parenchymal hemorrhage or extra-axial fluid collection. No mass lesion, mass effect, or midline shift. No CT evidence of acute infarction. Stable remote infarcts in the bilateral thalami and right cerebellar hemisphere. Stable prominent intracranial atherosclerosis, diffuse cerebral volume loss and extensive periventricular and subcortical white matter hypodensity, likely reflecting chronic small vessel ischemic change. Stable size of the cerebral ventricles, which are concordant with the degree of cerebral volume loss. The visualized paranasal sinuses are  essentially clear. The mastoid air cells are unopacified. No evidence of calvarial fracture. IMPRESSION: 1.  No evidence of acute intracranial abnormality. 2. Stable intracranial atherosclerosis, bilateral thalamic and  right cerebellar hemisphere remote infarcts, diffuse cerebral volume loss and extensive chronic small vessel ischemic white matter change. Electronically Signed   By: Delbert PhenixJason A Poff M.D.   On: 10-03-14 13:23   Dg Chest Port 1 View  10-03-14  CLINICAL DATA:  Altered mental status.  COPD.  CHF. EXAM: PORTABLE CHEST 1 VIEW COMPARISON:  10/04/2014 chest radiograph FINDINGS: Low lung volumes. Stable mild to moderate elevation of the right hemidiaphragm. Stable cardiomediastinal silhouette with mild cardiomegaly. No pneumothorax. Stable small right pleural effusion. No left pleural effusion. Mild pulmonary edema. Curvilinear opacities at the right lung base, in keeping with mild right basilar atelectasis. Bilateral breast prostheses with calcified capsules are again noted. IMPRESSION: 1. Stable mild cardiomegaly with mild pulmonary edema, in keeping with mild congestive heart failure. 2. Stable small right pleural effusion. 3. Mild right basilar atelectasis. Electronically Signed   By: Delbert PhenixJason A Poff M.D.   On: 10-03-14 12:16    CBC  Recent Labs Lab 05/07/14 1223 05/07/14 1228 11/30/14 0420 12/01/14 0730  WBC 9.0  --  16.5* 10.5  HGB 13.0 15.0 11.2* 10.1*  HCT 41.1 44.0 35.9* 31.6*  PLT 246  --  201 192  MCV 103.0*  --  104.7* 102.9*  MCH 32.6  --  32.7 32.9  MCHC 31.6  --  31.2 32.0  RDW 13.8  --  14.1 14.2  LYMPHSABS 1.0  --   --   --   MONOABS 0.6  --   --   --   EOSABS 0.2  --   --   --   BASOSABS 0.1  --   --   --     Chemistries   Recent Labs Lab 05/07/14 1223 05/07/14 1228 11/30/14 0420 12/01/14 0730  NA 139 140 143 140  K 4.5 4.3 4.5 4.4  CL 93* 96* 100* 97*  CO2 34*  --  33* 33*  GLUCOSE 211* 217* 261* 104*  BUN 64* 61* 62* 73*  CREATININE 1.54* 1.60*  1.69* 2.25*  CALCIUM 10.2  --  9.6 8.8*  AST 15  --  14*  --   ALT 12*  --  13*  --   ALKPHOS 75  --  64  --   BILITOT 0.5  --  0.7  --    ------------------------------------------------------------------------------------------------------------------ estimated creatinine clearance is 14.7 mL/min (by C-G formula based on Cr of 2.25). ------------------------------------------------------------------------------------------------------------------ No results for input(s): HGBA1C in the last 72 hours. ------------------------------------------------------------------------------------------------------------------ No results for input(s): CHOL, HDL, LDLCALC, TRIG, CHOLHDL, LDLDIRECT in the last 72 hours. ------------------------------------------------------------------------------------------------------------------ No results for input(s): TSH, T4TOTAL, T3FREE, THYROIDAB in the last 72 hours.  Invalid input(s): FREET3 ------------------------------------------------------------------------------------------------------------------ No results for input(s): VITAMINB12, FOLATE, FERRITIN, TIBC, IRON, RETICCTPCT in the last 72 hours.  Coagulation profile  Recent Labs Lab 05/07/14 1223  INR 0.96    No results for input(s): DDIMER in the last 72 hours.  Cardiac Enzymes No results for input(s): CKMB, TROPONINI, MYOGLOBIN in the last 168 hours.  Invalid input(s): CK ------------------------------------------------------------------------------------------------------------------ Invalid input(s): POCBNP    Shenika Quint D.O. on 12/02/2014 at 11:15 AM  Between 7am to 7pm - Pager - [phone]  After 7pm go to [email or web address] - password [email or web address]  And look for the night coverage person covering for me after hours  [facility] Office  [phone]

## 2014-12-03 ENCOUNTER — Encounter (HOSPITAL_COMMUNITY): Payer: Self-pay

## 2014-12-03 DIAGNOSIS — T68XXXA Hypothermia, initial encounter: Secondary | ICD-10-CM | POA: Insufficient documentation

## 2014-12-03 NOTE — Progress Notes (Addendum)
[facility]                                                                              Patient Demographics  Michelle Bowers, is a 81 y.o. female, DOB - 1931-02-04, ZOX:[id number]  Admit date - 12/06/2014   Admitting Physician Calvert Cantor, MD  Outpatient Primary MD for the patient is Julian Hy, MD  LOS - 4   Chief Complaint  Patient presents with  . Altered Mental Status      HPI on 11/16/2014 by Ms. [name], NP This is an 81 year old female patient with a history of chronic hypoxemia and COPD on 4 liters oxygen at baseline, HOCM with chronic recurrent pulmonary edema, chronic kidney disease, history of chronic microvascular ischemia, history of left external capsule and periventricular white matter stroke in 2010, diabetes on insulin, hypertension, anxiety and depression, dyslipidemia who was sent to the ER from skilled nursing facility secondary to altered mentation. Patient apparently was nonverbal at presentation but was able to convey to the ER staff that she was having headache and chest pain but no abdominal pain.  Upon arrival to the ER the patient was hypothermic with a rectal temperature of 96.3 prompting placement of warming blanket, patient was hypertensive with a blood pressure 179/65, heart rate was stable at 85 and she was maintaining sinus rhythm, she was on 2 L nasal cannula oxygen with sats 99%. Chest x-ray showed stable mild cardiomegaly and mild pulmonary edema with a stable small right pleural effusion and mild right basilar atelectasis. CT of the head was unremarkable. Electrolyte panel was normal except for chloride 96, BUN 61, creatinine 1.6 and elevated CO2 of 34. Glucose was slightly elevated to 17, LFTs were normal, troponin was normal, lactic acid was normal. CBC was normal except for elevated MCV of 103. PT/INR was normal. Urinalysis was abnormal and concerning for UTI with hazy appearance, many bacteria, hyaline casts, positive nitrite, 0-2  WBCs. Blood cultures and urine cultures were obtained in the ER. Because of presentation with hypothermia and altered mentation with apparent infectious source i.e. UTI patient was treated as possible sepsis despite normal lactic acid. She was given IV antibiotics (Rocephin) and because of her history of obstructive cardiomyopathy with rapid transition to heart failure she has been given small volume fluid challenges totaling 1 L since arrival. With the above treatments patient's mentation has improved noting she now awakens and attempts to verbally communicate noting this is limited by her hard of hearing status. Unfortunately she continues to drift off to sleep easily and remains tachypnea.  In talking with the daughters at the bedside, the patient's physician at the nursing facility had started her on Levaquin for presumed pneumonia/ possible UTI on 10/14. She also was being treated with Debrox for cerumen impaction.  Since my initial discussion with the patient's family my attending has evaluated the patient and all the family has decided to make the patient a DO NOT RESUSCITATE.  Assessment & Plan   Acute on chronic respiratory failure with hypoxia/ COPD -Patient did need bipap, however, family agreed to discontinue.  Patient now transitioned to comfort care.  -Patient's breathing more labored this morning.   Continue morphine  drip (will order to titrate upto 5mg /hr as needed for comfort) and PRN ativan  -Palliatve care consulted and appreciated -Currently on NRB- 15L  UTI  -UA consistent with UTI however WBCs normal, patient was started on Levaquin as an outpatient 11/24/2014 -Urine culture >100K Ecoli -Blood Culture show no growth to date -continue ceftriaxone  Bacteremia, GPC  -Blood cultures show 1/2 GPC--> SCN -was started on vancomycin, discontinued as SCN likely contaminant   Hypertrophic obstructive cardiomyopathy -Heart failure appears to be compensated however Lasix currently  held  Chronic anemia -Baseline hemoglobin appears 10-11  Dehydration -Evident on labs as patient appeared to be hemoconcentrated  -secondary to poor oral intake   Diabetes mellitus, type II -have discontinued meds/CBG monitoring due to comfort care  Acute on Chronic kidney disease, stage III -baseline Cr 1.2-1.3, last Cr 2.25 -Lasix held  GERD -PPI held   Hyperlipidemia -statin held  History of CVA  Goals of care -Palliative care consult appreciated. Patient transitioned to comfort care. Currently on morphine drip as well as PRN Ativan. -Have discontinued BiPAP.  Code Status: DNR  Family Communication: None at bedside  Disposition Plan: Admitted, Continue comfort care.  Time Spent in minutes   25 minutes  Procedures  None  Consults   Palliative care  DVT Prophylaxis  None- comfort care  Lab Results  Component Value Date   PLT 192 12/01/2014    Medications  Scheduled Meds: . carbamide peroxide  5 drop Both Ears QHS  . cefTRIAXone (ROCEPHIN)  IV  1 g Intravenous Q24H  . feeding supplement (ENSURE ENLIVE)  237 mL Oral BID BM  . sodium chloride  3 mL Intravenous Q12H   Continuous Infusions: . sodium chloride 10 mL/hr (12/01/14 1107)  . morphine 2 mg/hr (12/03/14 1013)   PRN Meds:.bisacodyl, ipratropium-albuterol, LORazepam, morphine injection  Antibiotics   Anti-infectives    Start     Dose/Rate Route Frequency Ordered Stop   11/30/14 1800  vancomycin (VANCOCIN) 500 mg in sodium chloride 0.9 % 100 mL IVPB  Status:  Discontinued     500 mg 100 mL/hr over 60 Minutes Intravenous Every 48 hours 11/30/14 1752 12/01/14 1341   12/07/2014 1400  cefTRIAXone (ROCEPHIN) 1 g in dextrose 5 % 50 mL IVPB     1 g 100 mL/hr over 30 Minutes Intravenous Every 24 hours 11/21/2014 1347       Subjective:   Rwanda Mcglown seen and examined today.  Patient currently on NRB.  Breathing more labored this morning.  Objective:   Filed Vitals:   12/01/14 0803 12/01/14  0905 12/01/14 1308 12/03/14 0911  BP: 135/43 130/25 126/74 118/27  Pulse: 70 78 82 66  Temp: 97.6 F (36.4 C)  97.6 F (36.4 C) 99.1 F (37.3 C)  TempSrc: Axillary  Axillary Axillary  Resp: 24 26 31 28   Height:      Weight:      SpO2: 100% 100% 97% 100%    Wt Readings from Last 3 Encounters:  12/01/14 48.444 kg (106 lb 12.8 oz)  10/09/14 51.3 kg (113 lb 1.5 oz)  09/11/14 56.1 kg (123 lb 10.9 oz)     Intake/Output Summary (Last 24 hours) at 12/03/14 1026 Last data filed at 12/03/14 0500  Gross per 24 hour  Intake    280 ml  Output      0 ml  Net    280 ml    Exam  General: Well developed, thin, elderly  HEENT: NCAT, NRB in place  Cardiovascular:  S1 S2 auscultated, RRR  Respiratory: Diminished but clear (anteriorly), labored  Data Review   Micro Results Recent Results (from the past 240 hour(s))  Blood culture (routine x 2)     Status: None   Collection Time: 11/18/2014 12:04 PM  Result Value Ref Range Status   Specimen Description BLOOD RIGHT ANTECUBITAL  Final   Special Requests BOTTLES DRAWN AEROBIC ONLY 2CC  Final   Culture  Setup Time   Final    GRAM POSITIVE COCCI IN CLUSTERS CRITICAL RESULT CALLED TO, READ BACK BY AND VERIFIED WITH: ZIVEREIGO RN 17:15 11/30/14 ([email or web address])    Culture   Final    STAPHYLOCOCCUS SPECIES (COAGULASE NEGATIVE) THE SIGNIFICANCE OF ISOLATING THIS ORGANISM FROM A SINGLE SET OF BLOOD CULTURES WHEN MULTIPLE SETS ARE DRAWN IS UNCERTAIN. PLEASE NOTIFY THE MICROBIOLOGY DEPARTMENT WITHIN ONE WEEK IF SPECIATION AND SENSITIVITIES ARE REQUIRED.    Report Status 12/01/2014 FINAL  Final  Blood culture (routine x 2)     Status: None (Preliminary result)   Collection Time: 11/18/2014 12:15 PM  Result Value Ref Range Status   Specimen Description BLOOD LEFT HAND  Final   Special Requests BOTTLES DRAWN AEROBIC AND ANAEROBIC 5CC  Final   Culture NO GROWTH 4 DAYS  Final   Report Status PENDING  Incomplete  Urine culture     Status: None    Collection Time: 12/11/2014 12:45 PM  Result Value Ref Range Status   Specimen Description URINE, CATHETERIZED  Final   Special Requests NONE  Final   Culture >=100,000 COLONIES/mL ESCHERICHIA COLI  Final   Report Status 12/01/2014 FINAL  Final   Organism ID, Bacteria ESCHERICHIA COLI  Final      Susceptibility   Escherichia coli - MIC*    AMPICILLIN <=2 SENSITIVE Sensitive     CEFAZOLIN <=4 SENSITIVE Sensitive     CEFTRIAXONE <=1 SENSITIVE Sensitive     CIPROFLOXACIN >=4 RESISTANT Resistant     GENTAMICIN <=1 SENSITIVE Sensitive     IMIPENEM <=0.25 SENSITIVE Sensitive     NITROFURANTOIN <=16 SENSITIVE Sensitive     TRIMETH/SULFA <=20 SENSITIVE Sensitive     AMPICILLIN/SULBACTAM <=2 SENSITIVE Sensitive     PIP/TAZO <=4 SENSITIVE Sensitive     * >=100,000 COLONIES/mL ESCHERICHIA COLI  MRSA PCR Screening     Status: None   Collection Time: 11/30/14 11:55 AM  Result Value Ref Range Status   MRSA by PCR NEGATIVE NEGATIVE Final    Comment:        The GeneXpert MRSA Assay (FDA approved for NASAL specimens only), is one component of a comprehensive MRSA colonization surveillance program. It is not intended to diagnose MRSA infection nor to guide or monitor treatment for MRSA infections.     Radiology Reports Ct Head Wo Contrast  12/10/2014  CLINICAL DATA:  Altered mental status. History of CVA with right-sided deficits. EXAM: CT HEAD WITHOUT CONTRAST TECHNIQUE: Contiguous axial images were obtained from the base of the skull through the vertex without intravenous contrast. COMPARISON:  09/03/2014 head CT. FINDINGS: No evidence of parenchymal hemorrhage or extra-axial fluid collection. No mass lesion, mass effect, or midline shift. No CT evidence of acute infarction. Stable remote infarcts in the bilateral thalami and right cerebellar hemisphere. Stable prominent intracranial atherosclerosis, diffuse cerebral volume loss and extensive periventricular and subcortical white matter  hypodensity, likely reflecting chronic small vessel ischemic change. Stable size of the cerebral ventricles, which are concordant with the degree of cerebral volume loss. The visualized paranasal sinuses are essentially  clear. The mastoid air cells are unopacified. No evidence of calvarial fracture. IMPRESSION: 1.  No evidence of acute intracranial abnormality. 2. Stable intracranial atherosclerosis, bilateral thalamic and right cerebellar hemisphere remote infarcts, diffuse cerebral volume loss and extensive chronic small vessel ischemic white matter change. Electronically Signed   By: Delbert Phenix M.D.   On: 12/11/2014 13:23   Dg Chest Port 1 View  12/02/2014  CLINICAL DATA:  Altered mental status.  COPD.  CHF. EXAM: PORTABLE CHEST 1 VIEW COMPARISON:  10/04/2014 chest radiograph FINDINGS: Low lung volumes. Stable mild to moderate elevation of the right hemidiaphragm. Stable cardiomediastinal silhouette with mild cardiomegaly. No pneumothorax. Stable small right pleural effusion. No left pleural effusion. Mild pulmonary edema. Curvilinear opacities at the right lung base, in keeping with mild right basilar atelectasis. Bilateral breast prostheses with calcified capsules are again noted. IMPRESSION: 1. Stable mild cardiomegaly with mild pulmonary edema, in keeping with mild congestive heart failure. 2. Stable small right pleural effusion. 3. Mild right basilar atelectasis. Electronically Signed   By: Delbert Phenix M.D.   On: 12/03/2014 12:16    CBC  Recent Labs Lab 11/28/2014 1223 11/21/2014 1228 11/30/14 0420 12/01/14 0730  WBC 9.0  --  16.5* 10.5  HGB 13.0 15.0 11.2* 10.1*  HCT 41.1 44.0 35.9* 31.6*  PLT 246  --  201 192  MCV 103.0*  --  104.7* 102.9*  MCH 32.6  --  32.7 32.9  MCHC 31.6  --  31.2 32.0  RDW 13.8  --  14.1 14.2  LYMPHSABS 1.0  --   --   --   MONOABS 0.6  --   --   --   EOSABS 0.2  --   --   --   BASOSABS 0.1  --   --   --     Chemistries   Recent Labs Lab 11/20/2014 1223  11/12/2014 1228 11/30/14 0420 12/01/14 0730  NA 139 140 143 140  K 4.5 4.3 4.5 4.4  CL 93* 96* 100* 97*  CO2 34*  --  33* 33*  GLUCOSE 211* 217* 261* 104*  BUN 64* 61* 62* 73*  CREATININE 1.54* 1.60* 1.69* 2.25*  CALCIUM 10.2  --  9.6 8.8*  AST 15  --  14*  --   ALT 12*  --  13*  --   ALKPHOS 75  --  64  --   BILITOT 0.5  --  0.7  --    ------------------------------------------------------------------------------------------------------------------ estimated creatinine clearance is 14.7 mL/min (by C-G formula based on Cr of 2.25). ------------------------------------------------------------------------------------------------------------------ No results for input(s): HGBA1C in the last 72 hours. ------------------------------------------------------------------------------------------------------------------ No results for input(s): CHOL, HDL, LDLCALC, TRIG, CHOLHDL, LDLDIRECT in the last 72 hours. ------------------------------------------------------------------------------------------------------------------ No results for input(s): TSH, T4TOTAL, T3FREE, THYROIDAB in the last 72 hours.  Invalid input(s): FREET3 ------------------------------------------------------------------------------------------------------------------ No results for input(s): VITAMINB12, FOLATE, FERRITIN, TIBC, IRON, RETICCTPCT in the last 72 hours.  Coagulation profile  Recent Labs Lab 12/03/2014 1223  INR 0.96    No results for input(s): DDIMER in the last 72 hours.  Cardiac Enzymes No results for input(s): CKMB, TROPONINI, MYOGLOBIN in the last 168 hours.  Invalid input(s): CK ------------------------------------------------------------------------------------------------------------------ Invalid input(s): POCBNP    Gunner Iodice D.O. on 12/03/2014 at 10:26 AM  Between 7am to 7pm - Pager - [phone]  After 7pm go to [email or web address] - password [email or web address]  And look for the night coverage  person covering for me after hours  [facility] Office  [phone]

## 2014-12-04 LAB — CULTURE, BLOOD (ROUTINE X 2): CULTURE: NO GROWTH

## 2014-12-12 NOTE — Progress Notes (Signed)
Patients breathing became extremely labored as the night progressed. Morphine drip was titrated up to 8.5 to keep the patient comfortable. Her breathing is now slow but even, she appears to be resting comfortably. Cindee SaltMcBride,Kae Lauman K, RN

## 2014-12-12 NOTE — Discharge Summary (Addendum)
Death Summary  [name] SAY:[id number] DOB: 03/30/1930 DOA: 03-22-2014  PCP: Julian HySOUTH,STEPHEN ALAN, MD PCP/Office notified:  Admit date: 03-22-2014 Date of Death: 11/22/2014  Final Diagnoses:  Principal Problem:   Metabolic encephalopathy Active Problems:   ANEMIA   Essential hypertension   COPD (chronic obstructive pulmonary disease) (HCC)   GERD   Diabetes mellitus with insulin therapy (HCC)   HLD (hyperlipidemia)   History of arterial ischemic stroke   Chronic respiratory failure with hypoxia (HCC)   CKD (chronic kidney disease) stage 3, GFR 30-59 ml/min   UTI (lower urinary tract infection)   Hypertrophic obstructive cardiomyopathy (HCC)   Palliative care encounter   Dyspnea   DNR (do not resuscitate)   Hypothermia  History of present illness:  on September 02, 2014 by Ms. Junious SilkAllison Ellis, NP This is an 81 year old female patient with a history of chronic hypoxemia and COPD on 4 liters oxygen at baseline, HOCM with chronic recurrent pulmonary edema, chronic kidney disease, history of chronic microvascular ischemia, history of left external capsule and periventricular white matter stroke in 2010, diabetes on insulin, hypertension, anxiety and depression, dyslipidemia who was sent to the ER from skilled nursing facility secondary to altered mentation. Patient apparently was nonverbal at presentation but was able to convey to the ER staff that she was having headache and chest pain but no abdominal pain.  Upon arrival to the ER the patient was hypothermic with a rectal temperature of 96.3 prompting placement of warming blanket, patient was hypertensive with a blood pressure 179/65, heart rate was stable at 85 and she was maintaining sinus rhythm, she was on 2 L nasal cannula oxygen with sats 99%. Chest x-ray showed stable mild cardiomegaly and mild pulmonary edema with a stable small right pleural effusion and mild right basilar atelectasis. CT of the head was unremarkable. Electrolyte  panel was normal except for chloride 96, BUN 61, creatinine 1.6 and elevated CO2 of 34. Glucose was slightly elevated to 17, LFTs were normal, troponin was normal, lactic acid was normal. CBC was normal except for elevated MCV of 103. PT/INR was normal. Urinalysis was abnormal and concerning for UTI with hazy appearance, many bacteria, hyaline casts, positive nitrite, 0-2 WBCs. Blood cultures and urine cultures were obtained in the ER. Because of presentation with hypothermia and altered mentation with apparent infectious source i.e. UTI patient was treated as possible sepsis despite normal lactic acid. She was given IV antibiotics (Rocephin) and because of her history of obstructive cardiomyopathy with rapid transition to heart failure she has been given small volume fluid challenges totaling 1 L since arrival. With the above treatments patient's mentation has improved noting she now awakens and attempts to verbally communicate noting this is limited by her hard of hearing status. Unfortunately she continues to drift off to sleep easily and remains tachypnea.  In talking with the daughters at the bedside, the patient's physician at the nursing facility had started her on Levaquin for presumed pneumonia/ possible UTI on 10/14. She also was being treated with Debrox for cerumen impaction.  Since my initial discussion with the patient's family my attending has evaluated the patient and all the family has decided to make the patient a DO NOT RESUSCITATE.  Hospital Course:  Acute on chronic respiratory failure with hypoxia/ COPD -Patient did need bipap, however, family agreed to discontinue. Patient now transitioned to comfort care.  -Palliatve care consulted and appreciated -Was on NRB 15L -Patient's breathing conLtinued to be labored.  Morphine drip was increased and titrated up  to 8.5mg /hr.  Patient expired at 0415 on 01-03-2015.  UTI  -UA consistent with UTI however WBCs normal, patient was started  on Levaquin as an outpatient 11/24/2014 -Urine culture >100K Ecoli -Blood Culture show no growth to date -initially placed on ceftriaxone  Bacteremia, GPC  -Blood cultures show 1/2 GPC--> SCN -was started on vancomycin, discontinued as SCN likely contaminant   Hypertrophic obstructive cardiomyopathy -Heart failure appears to be compensated however Lasix currently held  Chronic anemia -Baseline hemoglobin appears 10-11  Dehydration -Evident on labs as patient appeared to be hemoconcentrated  -secondary to poor oral intake  Diabetes mellitus, type II -have discontinued meds/CBG monitoring due to comfort care  Acute on Chronic kidney disease, stage III -baseline Cr 1.2-1.3, last Cr 2.25 -Lasix held  GERD -PPI held   Hyperlipidemia -statin held  History of CVA  Goals of care -Palliative care consult appreciated. Patient transitioned to comfort care. Currently on morphine drip as well as PRN Ativan. -Have discontinued BiPAP.  Time: 15 minutes  Signed:  [name]  [facility] 2015-01-03, 3:15 PM  Addendum coding query Chronic diastolic heart failure -Echocardiogram July 2016 showed an EF of 6065%, grade 1 diastolic dysfunction

## 2014-12-12 DEATH — deceased

## 2017-02-22 IMAGING — US US RENAL PORT
1 series · 14 of 25 positions shown · non-contrast
Comparison: None.

CLINICAL DATA: Oliguria.

EXAM:
RENAL / URINARY TRACT ULTRASOUND COMPLETE

[Series 1: us renal port · 0.20mm/px · 14 of 31 slices shown]
[im 1/31]
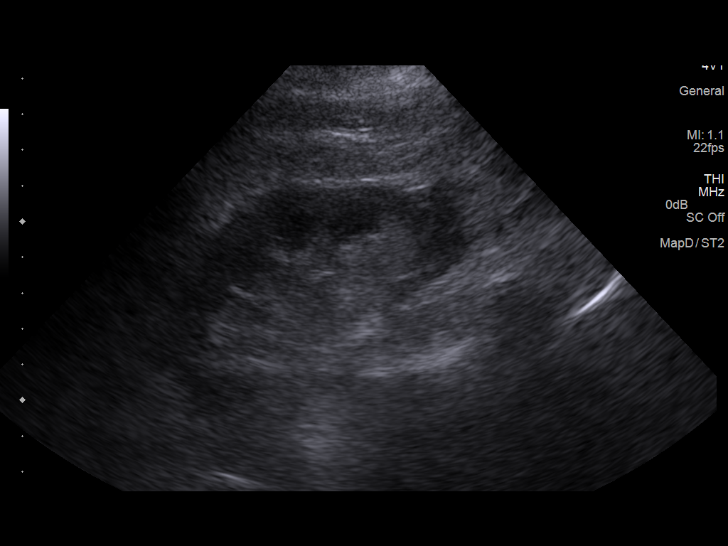
[im 3/31]
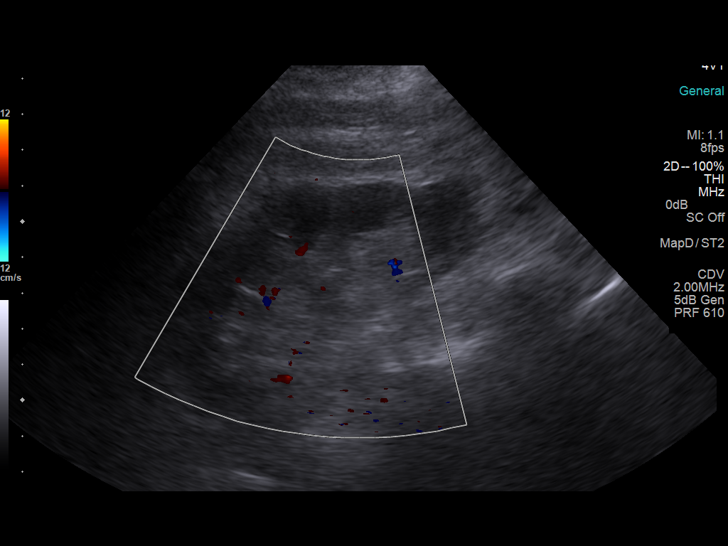
[im 6/31]
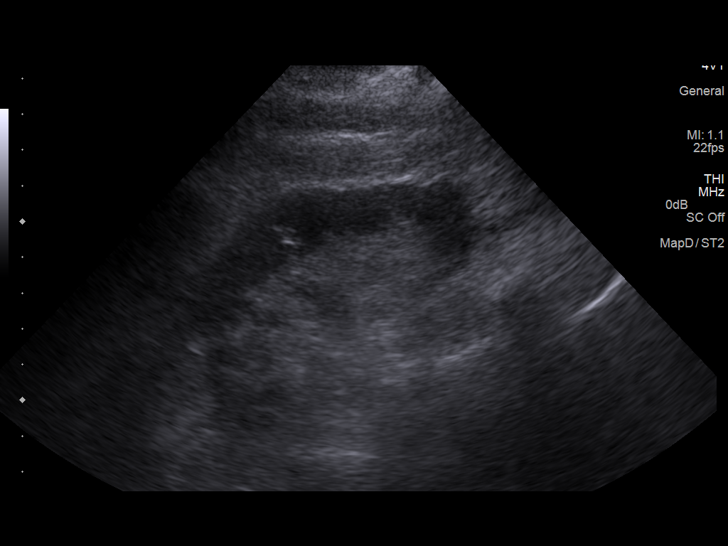
[im 8/31]
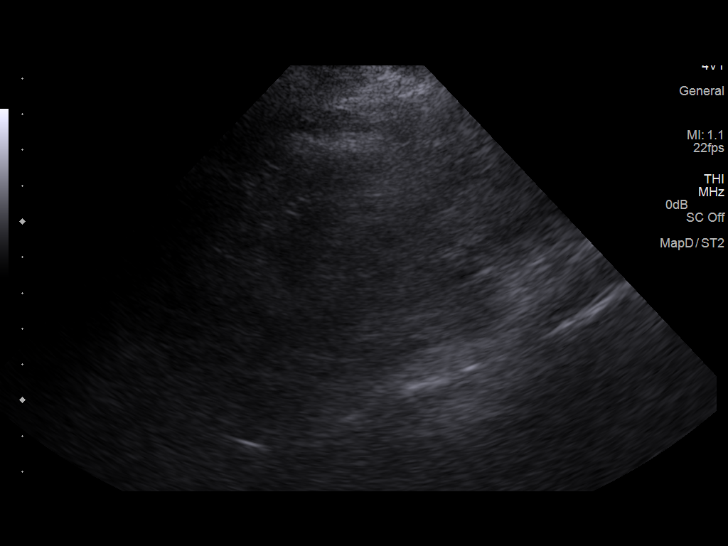
[im 11/31]
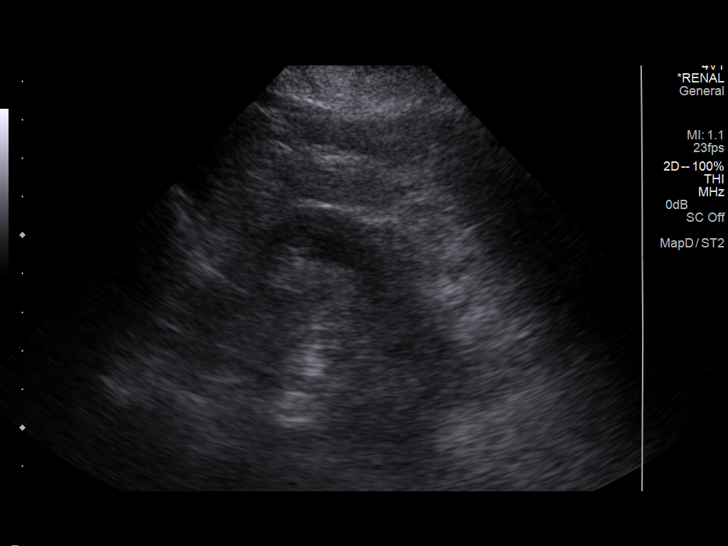
[im 12/31]
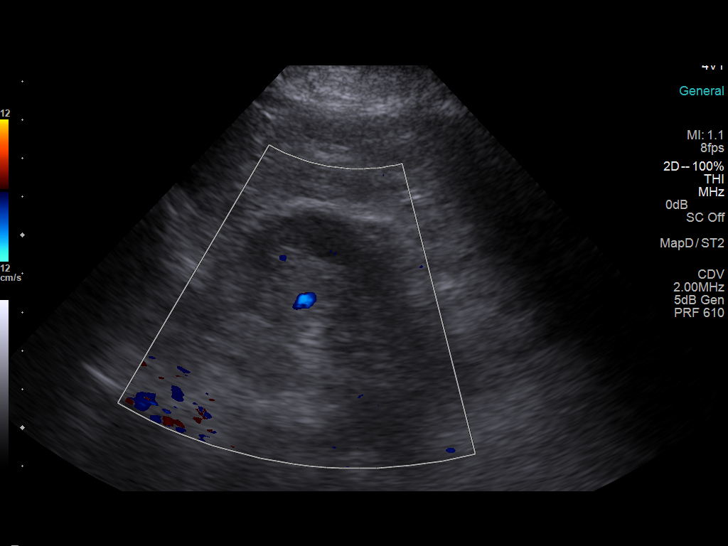
[im 14/31]
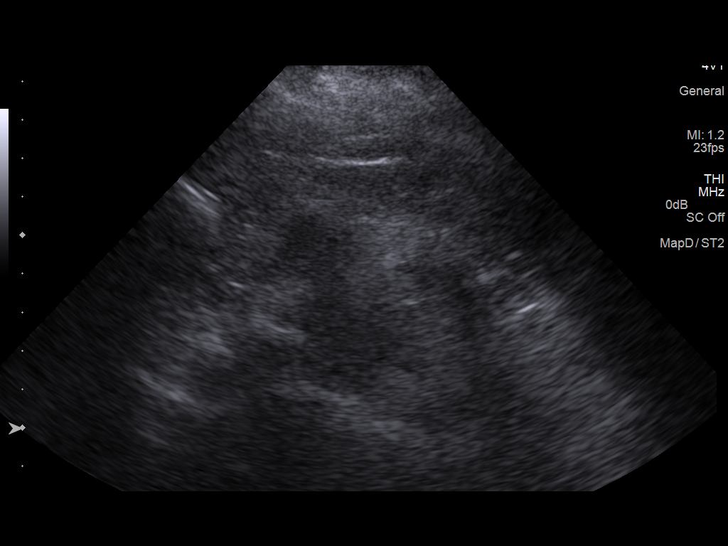
[im 17/31]
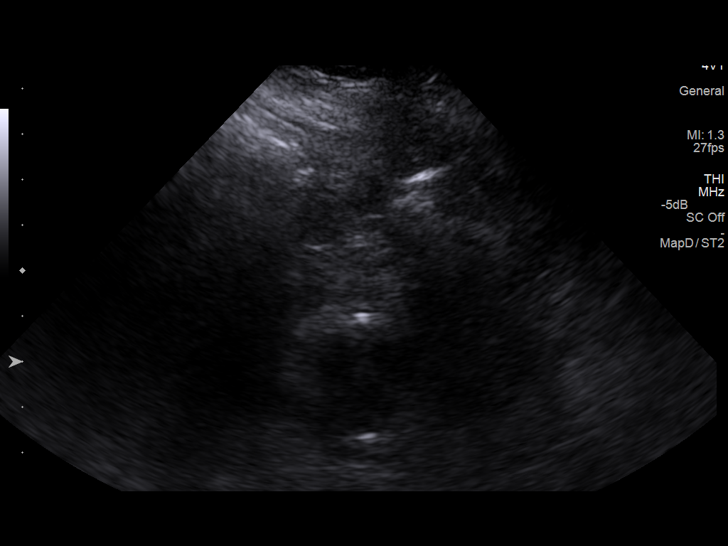
[im 19/31]
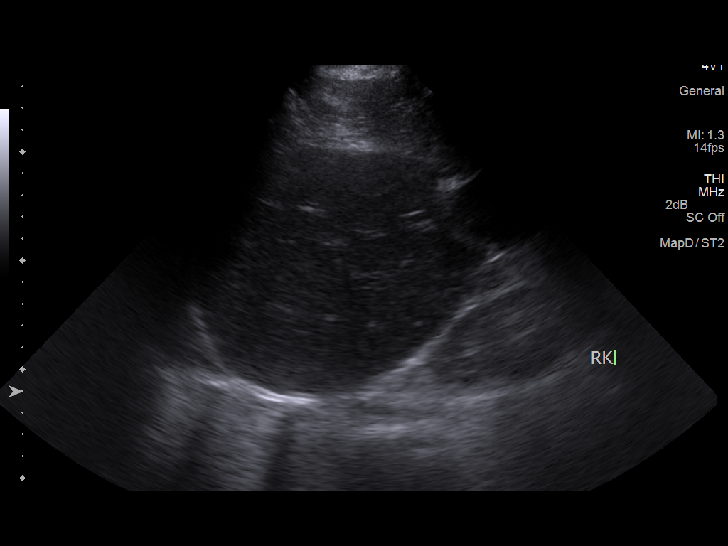
[im 21/31]
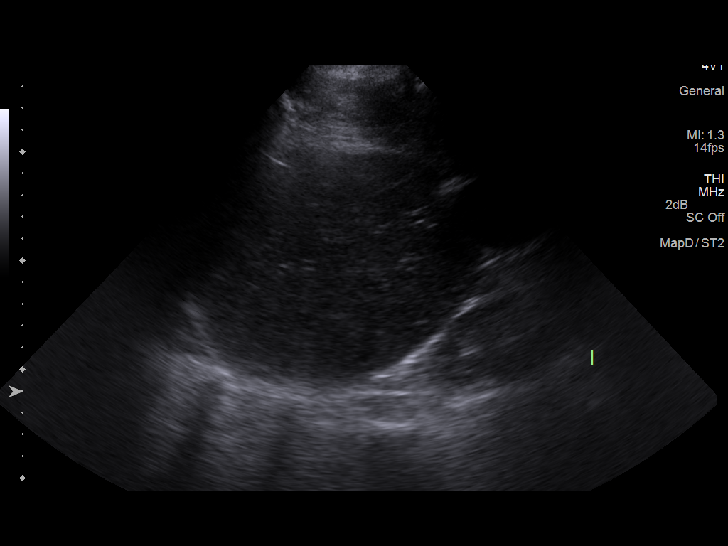
[im 23/31]
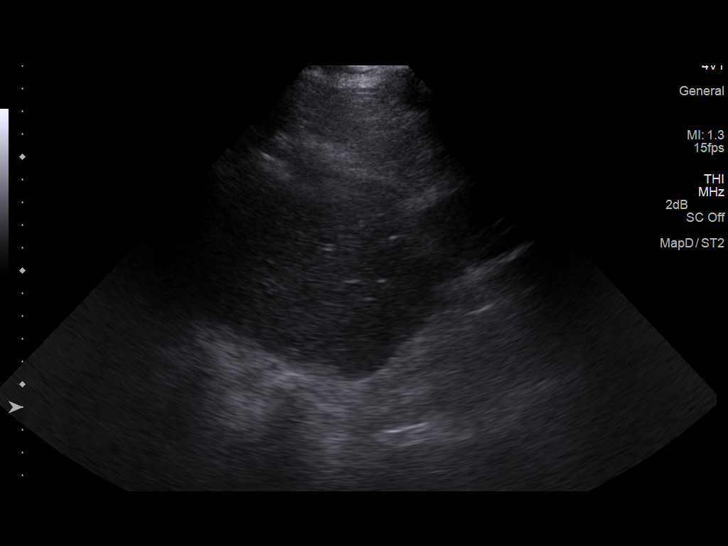
[im 26/31]
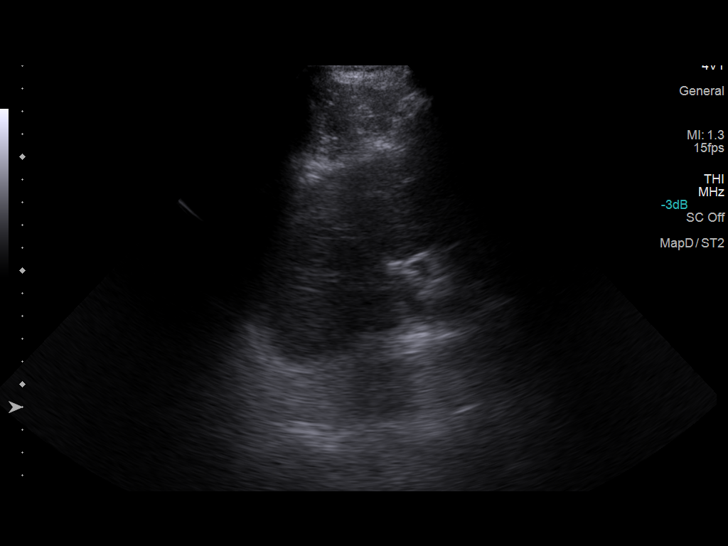
[im 28/31]
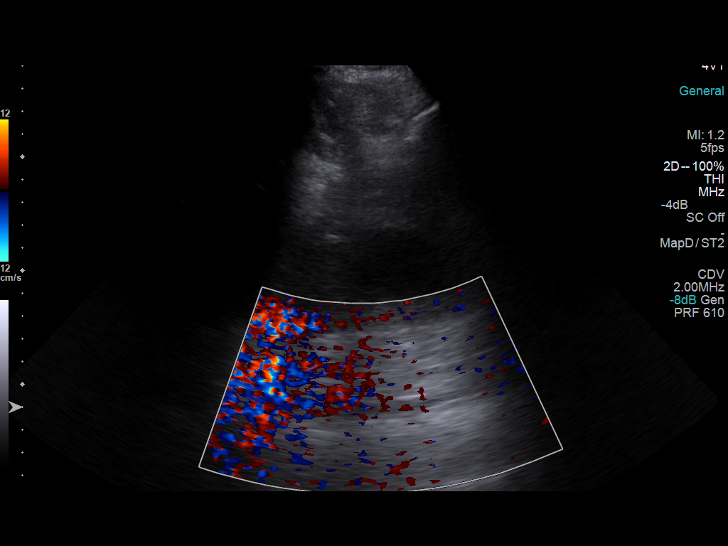
[im 31/31]
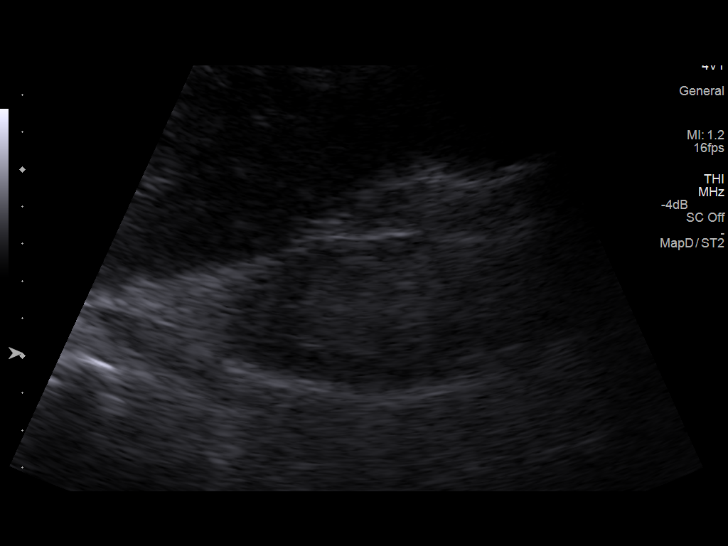

[14 of 25 positions shown; findings below may reference images not displayed]

FINDINGS: Right Kidney:

Length: 8.6 cm. There is thinning of the renal parenchyma and
increased renal echogenicity. Technically limited evaluation of the
right kidney limiting detailed evaluation.

Left Kidney:

Length: 9.3 cm. Echogenicity within normal limits. No mass or
hydronephrosis visualized.

Bladder:

Decompressed by Foley catheter not evaluated.
IMPRESSION: 1. Right kidney appears atrophic with thinning and increased
echogenicity of the renal parenchyma, suggestive of chronic renal
disease. Cortical thickness of the left kidney is preserved.
2. No hydronephrosis.
3. Urinary bladder decompressed by Foley catheter.

## 2017-03-03 IMAGING — DX DG CHEST 2V
2 series · 2 of 2 positions shown · non-contrast
Comparison: Single frontal view earlier this day. Concurrently
performed abdominal radiographs.

CLINICAL DATA: Shortness of breath, hypoglycemia, fatigue. Symptoms
for 1 day.

EXAM:
CHEST  2 VIEW

[chest pa]
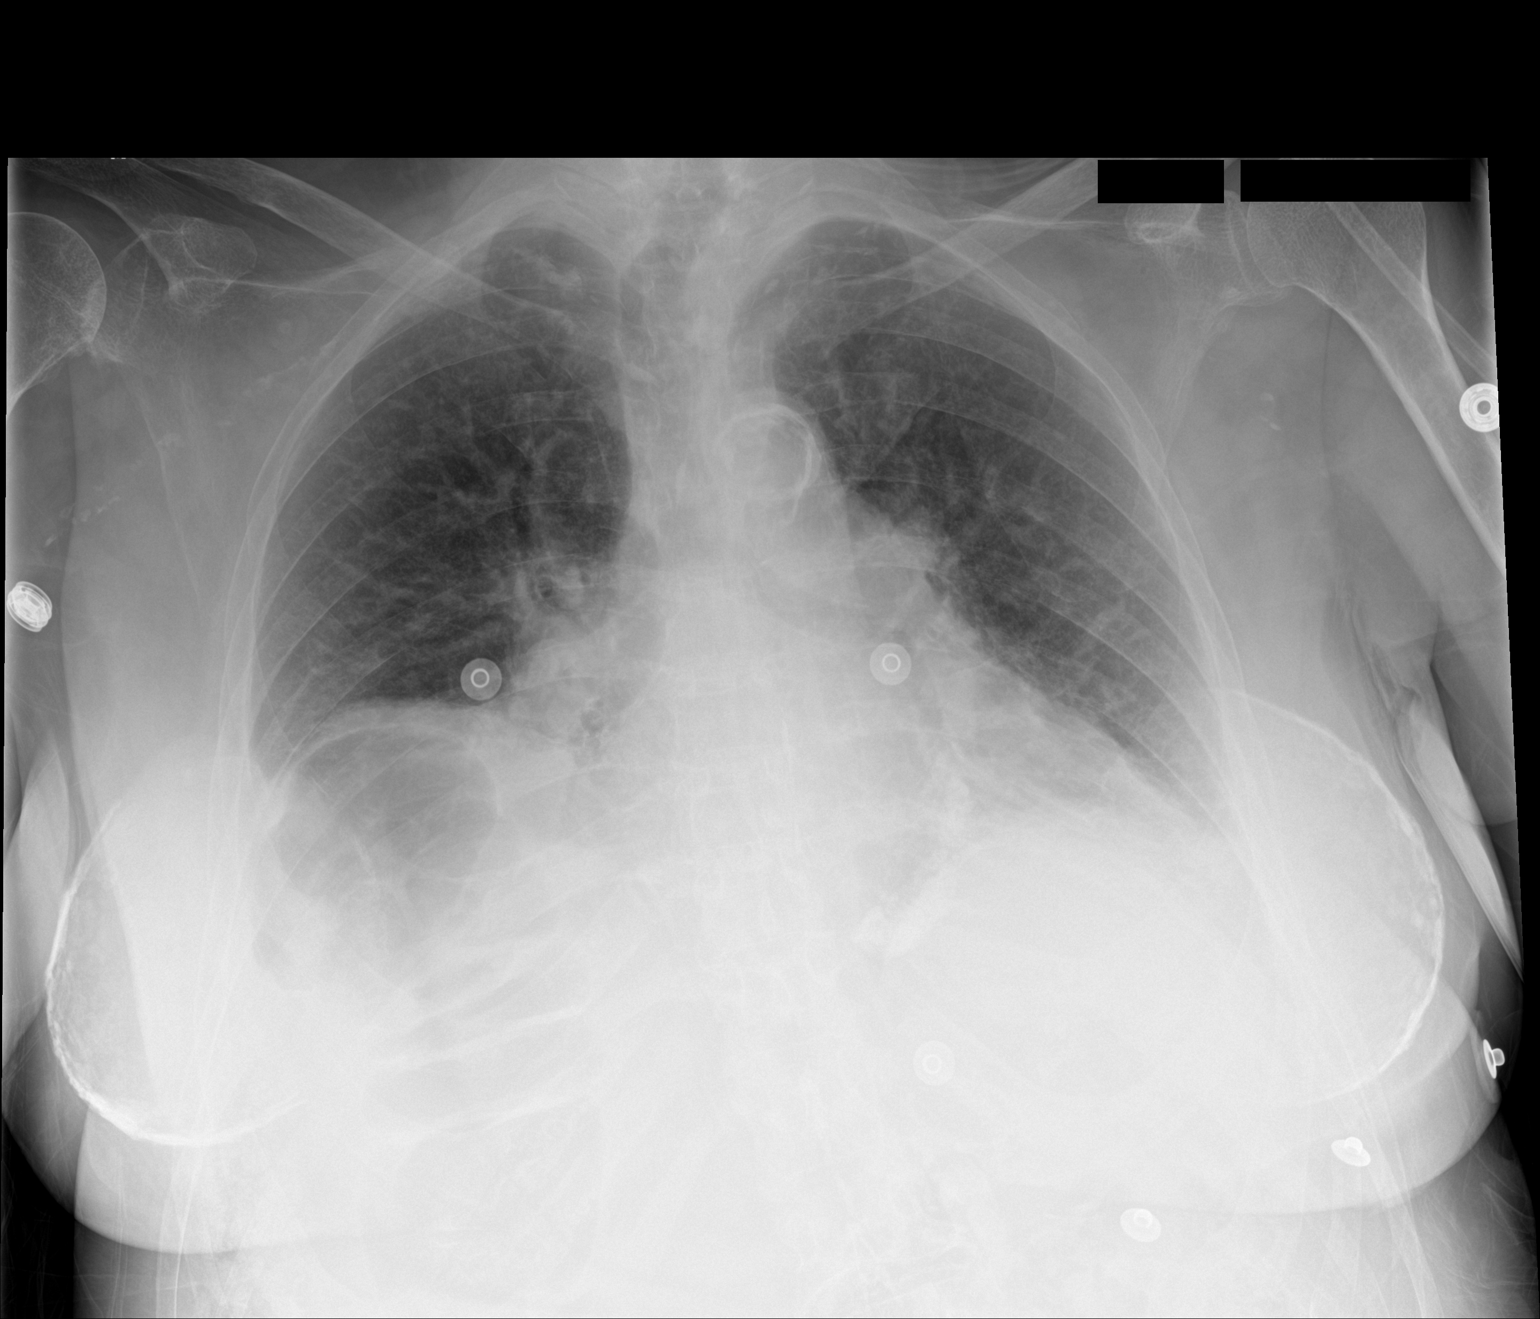

[chest lat]
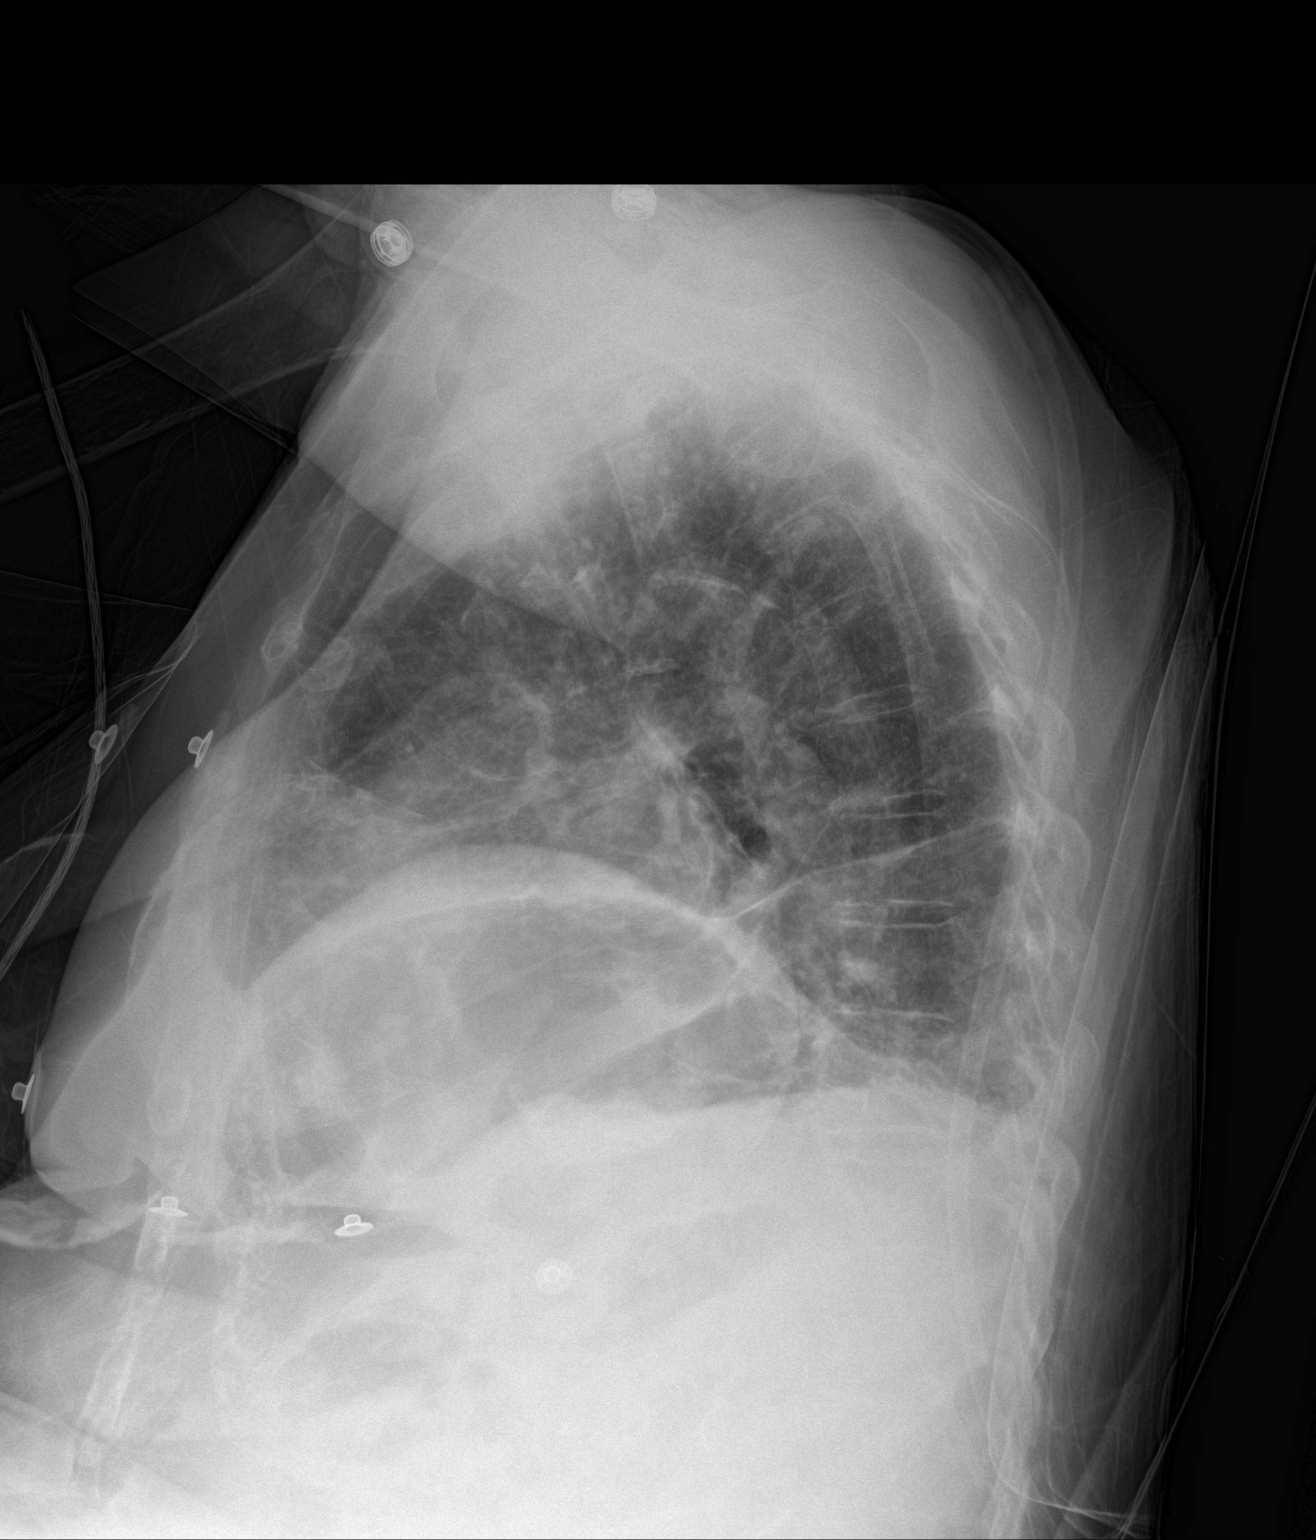

[2 of 2 positions shown; findings below may reference images not displayed]

FINDINGS: Low lung volumes persist. Cardiomegaly and vascular congestion,
unchanged from prior exam. There is again seen elevation of right
hemidiaphragm. Mild retrocardiac atelectasis. No pneumothorax.

Air under the right hemidiaphragm appears to represent interposed
air-filled colon, no findings of pneumoperitoneum.

The bones are under mineralized. Bilateral calcified breast implants
noted.
IMPRESSION: 1. Hypoventilatory chest with stable cardiomegaly and vascular
congestion.
2. Air under the right hemidiaphragm appears to be within air-filled
colon.

## 2017-03-30 IMAGING — CR DG CHEST 1V PORT
1 series · 1 of 1 positions shown · non-contrast
Comparison: Chest radiograph performed 08/07/2014

CLINICAL DATA: Acute onset of shortness of breath. Initial
encounter.

EXAM:
PORTABLE CHEST - 1 VIEW

[AP]
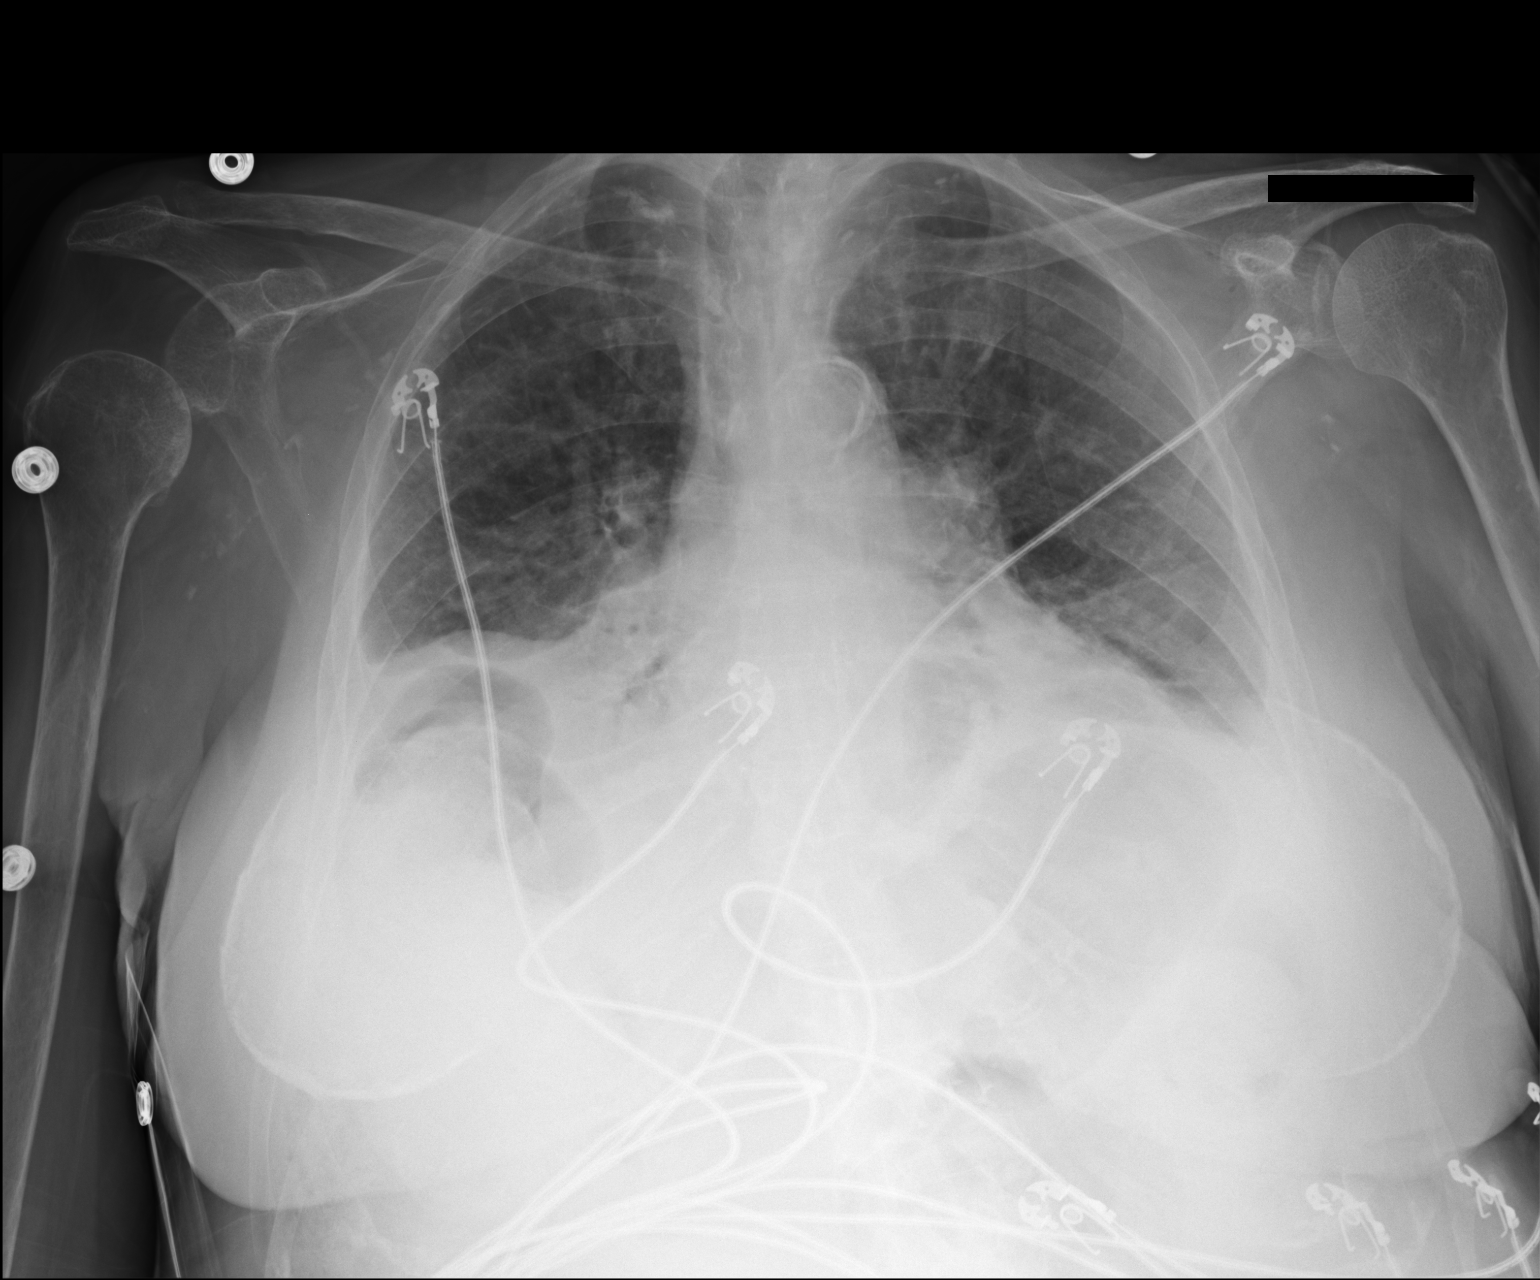

[1 of 1 positions shown; findings below may reference images not displayed]

FINDINGS: The lungs are hypoexpanded. Bibasilar airspace opacity could reflect
pneumonia. No pleural effusion or pneumothorax is seen.

The cardiomediastinal silhouette is mildly enlarged. No acute
osseous abnormalities are identified.
IMPRESSION: Lungs hypoexpanded. Bibasilar airspace opacities could reflect
pneumonia. Mild cardiomegaly.

## 2017-03-31 IMAGING — DX DG CHEST 1V PORT
1 series · 1 of 1 positions shown · non-contrast
Comparison: 09/03/2014

CLINICAL DATA: Pulmonary edema

EXAM:
PORTABLE CHEST - 1 VIEW

[chest ap]
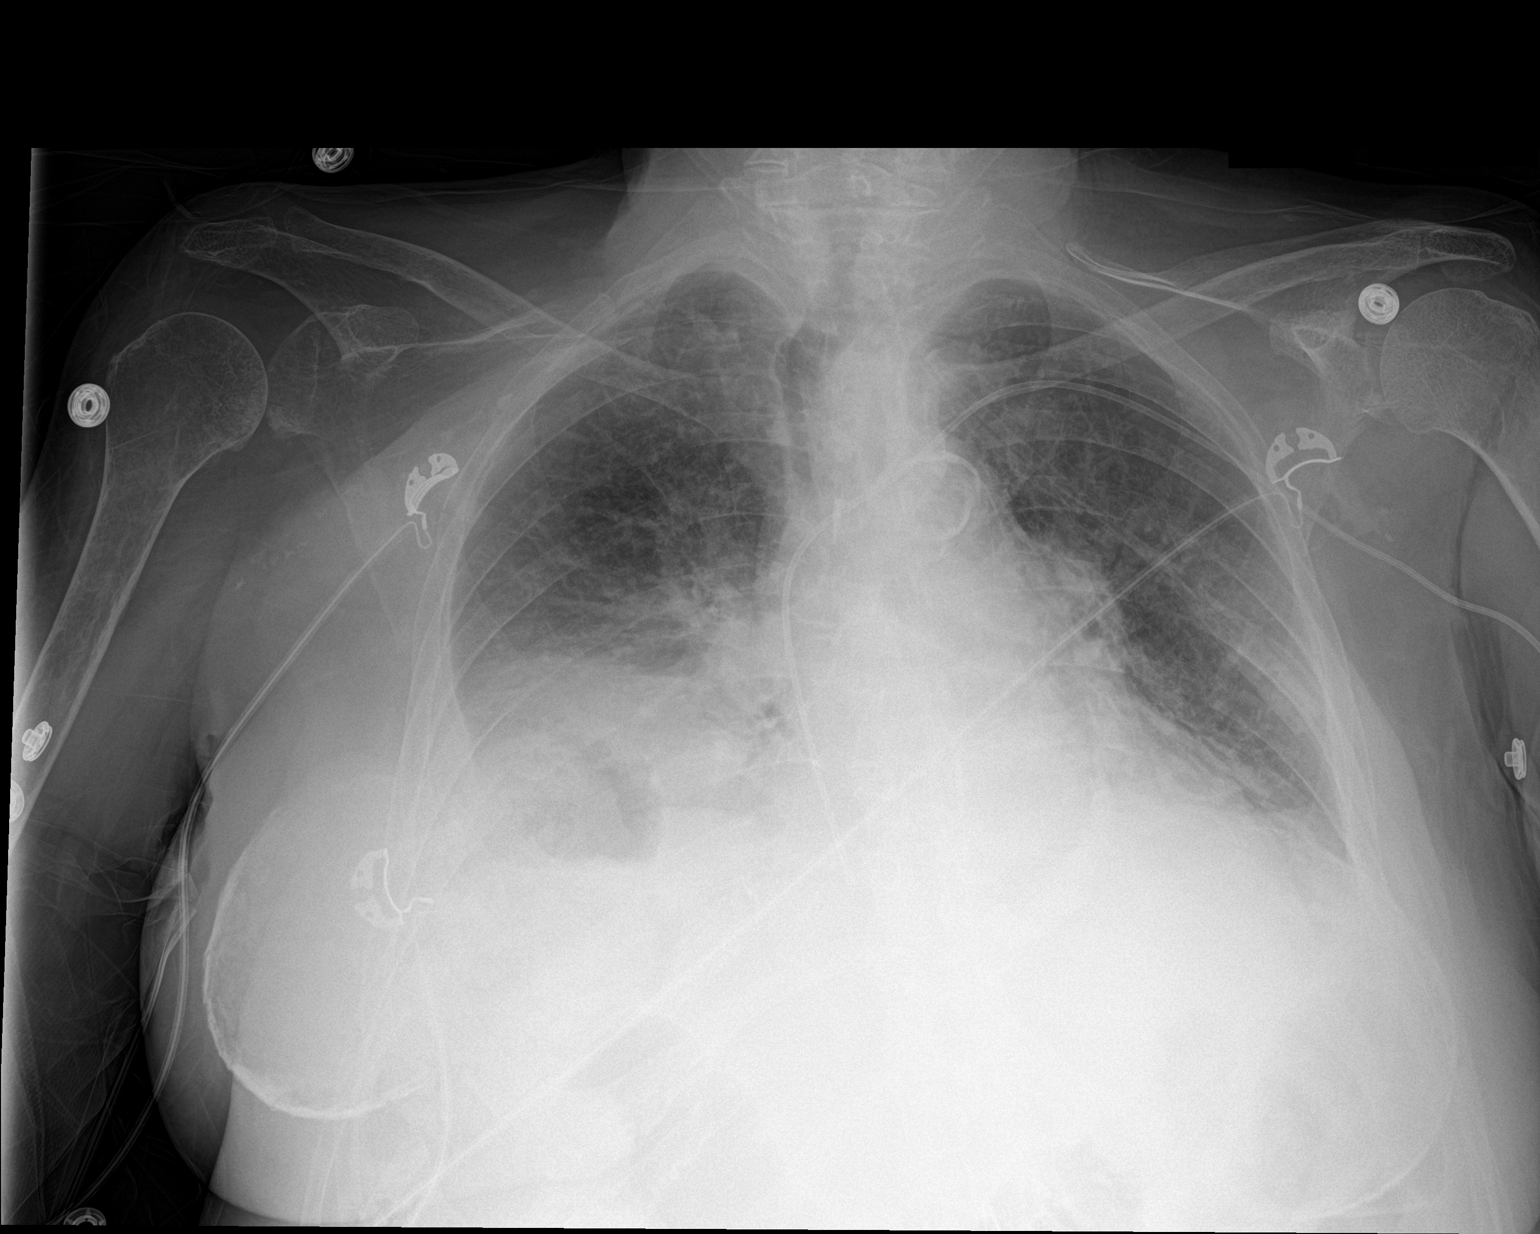

[1 of 1 positions shown; findings below may reference images not displayed]

FINDINGS: Moderate pleural effusion bilaterally unchanged with compressive
atelectasis in both lung bases. Mild vascular congestion

Left arm PICC tip has been placed with the tip in the right atrium.
Recommend withdrawal of 4 cm.
IMPRESSION: PICC tip in the right atrium, recommend withdrawal 4 cm

Bibasilar atelectasis and effusion unchanged.
